# Patient Record
Sex: Female | Born: 1945 | Race: White | Hispanic: No | Marital: Single | Smoking: Former smoker
Health system: Southern US, Community
[De-identification: ages and names within clinical notes are randomized; demographics above are authoritative.]

## PROBLEM LIST (undated history)

## (undated) DIAGNOSIS — K219 Gastro-esophageal reflux disease without esophagitis: Secondary | ICD-10-CM

## (undated) DIAGNOSIS — Z86718 Personal history of other venous thrombosis and embolism: Secondary | ICD-10-CM

## (undated) DIAGNOSIS — E119 Type 2 diabetes mellitus without complications: Secondary | ICD-10-CM

## (undated) DIAGNOSIS — G4733 Obstructive sleep apnea (adult) (pediatric): Secondary | ICD-10-CM

## (undated) DIAGNOSIS — M353 Polymyalgia rheumatica: Secondary | ICD-10-CM

## (undated) HISTORY — PX: NECK SURGERY: SHX720

## (undated) HISTORY — DX: Obstructive sleep apnea (adult) (pediatric): G47.33

## (undated) HISTORY — DX: Morbid (severe) obesity due to excess calories: E66.01

## (undated) HISTORY — PX: CHOLECYSTECTOMY: SHX55

## (undated) HISTORY — DX: Personal history of other venous thrombosis and embolism: Z86.718

## (undated) HISTORY — DX: Type 2 diabetes mellitus without complications: E11.9

## (undated) HISTORY — DX: Polymyalgia rheumatica: M35.3

## (undated) HISTORY — PX: CYSTECTOMY: SUR359

## (undated) HISTORY — PX: OTHER SURGICAL HISTORY: SHX169

---

## 2006-10-12 ENCOUNTER — Telehealth: Payer: Self-pay | Admitting: Family Medicine

## 2006-10-12 ENCOUNTER — Ambulatory Visit: Payer: Self-pay | Admitting: Family Medicine

## 2006-10-12 DIAGNOSIS — G44209 Tension-type headache, unspecified, not intractable: Secondary | ICD-10-CM

## 2006-10-12 DIAGNOSIS — I2699 Other pulmonary embolism without acute cor pulmonale: Secondary | ICD-10-CM

## 2006-10-12 LAB — CONVERTED CEMR LAB
Albumin: 4.3 g/dL (ref 3.5–5.2)
Alkaline Phosphatase: 118 units/L — ABNORMAL HIGH (ref 39–117)
Amylase: 32 units/L (ref 0–105)
Basophils Relative: 0 % (ref 0–1)
CO2: 22 meq/L (ref 19–32)
Calcium: 8.7 mg/dL (ref 8.4–10.5)
Chloride: 101 meq/L (ref 96–112)
Glucose, Bld: 103 mg/dL — ABNORMAL HIGH (ref 70–99)
Hemoglobin: 15.4 g/dL — ABNORMAL HIGH (ref 12.0–15.0)
Hep B C IgM: NEGATIVE
Hepatitis B Surface Ag: NEGATIVE
INR: 1.6
Lymphocytes Relative: 27 % (ref 12–46)
Lymphs Abs: 1.5 10*3/uL (ref 0.7–3.3)
Monocytes Absolute: 0.5 10*3/uL (ref 0.2–0.7)
Monocytes Relative: 8 % (ref 3–11)
Neutro Abs: 3.7 10*3/uL (ref 1.7–7.7)
Neutrophils Relative %: 65 % (ref 43–77)
Potassium: 3.7 meq/L (ref 3.5–5.3)
Prothrombin Time: 15.7 s
RBC: 4.67 M/uL (ref 3.87–5.11)
Sodium: 135 meq/L (ref 135–145)
Specific Gravity, Urine: 1.021 (ref 1.005–1.03)
Total Protein: 7.1 g/dL (ref 6.0–8.3)
Urobilinogen, UA: 1 (ref 0.0–1.0)
WBC: 5.6 10*3/uL (ref 4.0–10.5)

## 2006-10-13 ENCOUNTER — Telehealth: Payer: Self-pay | Admitting: Family Medicine

## 2006-11-17 ENCOUNTER — Ambulatory Visit: Payer: Self-pay | Admitting: Family Medicine

## 2006-11-17 HISTORY — DX: Morbid (severe) obesity due to excess calories: E66.01

## 2006-11-17 LAB — CONVERTED CEMR LAB: Prothrombin Time: 18.9 s

## 2006-12-08 ENCOUNTER — Ambulatory Visit: Payer: Self-pay | Admitting: Family Medicine

## 2006-12-08 DIAGNOSIS — R5381 Other malaise: Secondary | ICD-10-CM

## 2006-12-08 DIAGNOSIS — R5383 Other fatigue: Secondary | ICD-10-CM

## 2007-01-05 ENCOUNTER — Ambulatory Visit: Payer: Self-pay | Admitting: Family Medicine

## 2007-01-05 ENCOUNTER — Encounter: Admission: RE | Admit: 2007-01-05 | Discharge: 2007-01-05 | Payer: Self-pay | Admitting: Family Medicine

## 2007-01-05 LAB — CONVERTED CEMR LAB
INR: 1.7
Prothrombin Time: 16.3 s

## 2007-02-15 ENCOUNTER — Ambulatory Visit: Payer: Self-pay | Admitting: Family Medicine

## 2007-02-15 ENCOUNTER — Telehealth: Payer: Self-pay | Admitting: Family Medicine

## 2007-02-15 DIAGNOSIS — H8110 Benign paroxysmal vertigo, unspecified ear: Secondary | ICD-10-CM | POA: Insufficient documentation

## 2007-05-18 ENCOUNTER — Encounter: Admission: RE | Admit: 2007-05-18 | Discharge: 2007-05-18 | Payer: Self-pay | Admitting: Family Medicine

## 2007-05-18 ENCOUNTER — Ambulatory Visit: Payer: Self-pay | Admitting: Family Medicine

## 2007-05-19 ENCOUNTER — Encounter: Payer: Self-pay | Admitting: Family Medicine

## 2007-06-01 ENCOUNTER — Ambulatory Visit: Payer: Self-pay | Admitting: Family Medicine

## 2007-06-01 DIAGNOSIS — M129 Arthropathy, unspecified: Secondary | ICD-10-CM

## 2007-06-30 ENCOUNTER — Ambulatory Visit: Payer: Self-pay | Admitting: Family Medicine

## 2007-06-30 DIAGNOSIS — R079 Chest pain, unspecified: Secondary | ICD-10-CM

## 2007-07-03 LAB — CONVERTED CEMR LAB
AST: 27 units/L (ref 0–37)
Albumin: 4.4 g/dL (ref 3.5–5.2)
BUN: 10 mg/dL (ref 6–23)
Calcium: 9.3 mg/dL (ref 8.4–10.5)
Chloride: 103 meq/L (ref 96–112)
Glucose, Bld: 92 mg/dL (ref 70–99)
Lymphocytes Relative: 33 % (ref 12–46)
Lymphs Abs: 2.9 10*3/uL (ref 0.7–4.0)
Monocytes Relative: 7 % (ref 3–12)
Neutro Abs: 5.1 10*3/uL (ref 1.7–7.7)
Neutrophils Relative %: 58 % (ref 43–77)
Potassium: 4.7 meq/L (ref 3.5–5.3)
RBC: 4.72 M/uL (ref 3.87–5.11)
WBC: 8.9 10*3/uL (ref 4.0–10.5)

## 2007-08-25 ENCOUNTER — Encounter: Payer: Self-pay | Admitting: Family Medicine

## 2007-08-25 ENCOUNTER — Ambulatory Visit: Payer: Self-pay | Admitting: Family Medicine

## 2007-08-25 ENCOUNTER — Encounter: Admission: RE | Admit: 2007-08-25 | Discharge: 2007-08-25 | Payer: Self-pay | Admitting: Family Medicine

## 2007-08-25 ENCOUNTER — Other Ambulatory Visit: Admission: RE | Admit: 2007-08-25 | Discharge: 2007-08-25 | Payer: Self-pay | Admitting: Family Medicine

## 2007-08-25 DIAGNOSIS — R609 Edema, unspecified: Secondary | ICD-10-CM | POA: Insufficient documentation

## 2007-08-25 LAB — CONVERTED CEMR LAB
Blood in Urine, dipstick: NEGATIVE
Glucose, Urine, Semiquant: NEGATIVE
Nitrite: NEGATIVE
Urobilinogen, UA: 0.2

## 2007-08-28 ENCOUNTER — Encounter: Payer: Self-pay | Admitting: Family Medicine

## 2007-08-28 DIAGNOSIS — E785 Hyperlipidemia, unspecified: Secondary | ICD-10-CM | POA: Insufficient documentation

## 2007-08-28 LAB — CONVERTED CEMR LAB
AST: 32 units/L (ref 0–37)
Albumin: 4.2 g/dL (ref 3.5–5.2)
Alkaline Phosphatase: 111 units/L (ref 39–117)
BUN: 10 mg/dL (ref 6–23)
Cholesterol, target level: 200 mg/dL
HDL: 41 mg/dL (ref >39)
LDL Cholesterol: 145 mg/dL — ABNORMAL HIGH (ref 0–99)
Potassium: 4.2 meq/L (ref 3.5–5.3)
TSH: 2.952 microintl units/mL (ref 0.350–5.50)
Total Bilirubin: 0.9 mg/dL (ref 0.3–1.2)
Total CHOL/HDL Ratio: 5.7
VLDL: 46 mg/dL — ABNORMAL HIGH (ref 0–40)

## 2007-09-20 ENCOUNTER — Encounter: Payer: Self-pay | Admitting: Family Medicine

## 2007-09-21 ENCOUNTER — Telehealth: Payer: Self-pay | Admitting: Family Medicine

## 2007-09-21 ENCOUNTER — Ambulatory Visit: Payer: Self-pay | Admitting: Family Medicine

## 2007-09-21 DIAGNOSIS — G4733 Obstructive sleep apnea (adult) (pediatric): Secondary | ICD-10-CM

## 2007-09-21 HISTORY — DX: Obstructive sleep apnea (adult) (pediatric): G47.33

## 2007-09-22 ENCOUNTER — Telehealth (INDEPENDENT_AMBULATORY_CARE_PROVIDER_SITE_OTHER): Payer: Self-pay | Admitting: *Deleted

## 2007-10-25 ENCOUNTER — Telehealth (INDEPENDENT_AMBULATORY_CARE_PROVIDER_SITE_OTHER): Payer: Self-pay | Admitting: *Deleted

## 2007-11-13 ENCOUNTER — Ambulatory Visit: Payer: Self-pay | Admitting: Family Medicine

## 2007-11-13 ENCOUNTER — Encounter: Admission: RE | Admit: 2007-11-13 | Discharge: 2007-11-13 | Payer: Self-pay | Admitting: Family Medicine

## 2007-11-13 DIAGNOSIS — M79609 Pain in unspecified limb: Secondary | ICD-10-CM | POA: Insufficient documentation

## 2007-11-14 ENCOUNTER — Telehealth: Payer: Self-pay | Admitting: Family Medicine

## 2007-11-14 LAB — CONVERTED CEMR LAB
Basophils Relative: 1 % (ref 0–1)
Eosinophils Absolute: 0.2 10*3/uL (ref 0.0–0.7)
HCT: 47.4 % — ABNORMAL HIGH (ref 36.0–46.0)
Hemoglobin: 15.7 g/dL — ABNORMAL HIGH (ref 12.0–15.0)
Lymphs Abs: 3.1 10*3/uL (ref 0.7–4.0)
MCHC: 33.1 g/dL (ref 30.0–36.0)
MCV: 99.8 fL (ref 78.0–100.0)
Monocytes Absolute: 0.5 10*3/uL (ref 0.1–1.0)
Monocytes Relative: 6 % (ref 3–12)
RBC: 4.75 M/uL (ref 3.87–5.11)

## 2008-03-20 ENCOUNTER — Ambulatory Visit: Payer: Self-pay | Admitting: Family Medicine

## 2008-03-20 DIAGNOSIS — J1089 Influenza due to other identified influenza virus with other manifestations: Secondary | ICD-10-CM

## 2008-03-25 ENCOUNTER — Telehealth (INDEPENDENT_AMBULATORY_CARE_PROVIDER_SITE_OTHER): Payer: Self-pay | Admitting: *Deleted

## 2008-03-25 ENCOUNTER — Ambulatory Visit: Payer: Self-pay | Admitting: Family Medicine

## 2008-03-25 ENCOUNTER — Telehealth: Payer: Self-pay | Admitting: Family Medicine

## 2008-06-13 ENCOUNTER — Ambulatory Visit: Payer: Self-pay | Admitting: Family Medicine

## 2008-06-13 DIAGNOSIS — M542 Cervicalgia: Secondary | ICD-10-CM

## 2008-06-19 ENCOUNTER — Encounter: Admission: RE | Admit: 2008-06-19 | Discharge: 2008-08-30 | Payer: Self-pay | Admitting: Family Medicine

## 2008-06-25 ENCOUNTER — Encounter: Payer: Self-pay | Admitting: Family Medicine

## 2008-07-25 ENCOUNTER — Encounter: Payer: Self-pay | Admitting: Family Medicine

## 2008-08-22 ENCOUNTER — Encounter: Admission: RE | Admit: 2008-08-22 | Discharge: 2008-08-22 | Payer: Self-pay | Admitting: Family Medicine

## 2008-08-22 ENCOUNTER — Telehealth (INDEPENDENT_AMBULATORY_CARE_PROVIDER_SITE_OTHER): Payer: Self-pay | Admitting: *Deleted

## 2008-08-22 ENCOUNTER — Ambulatory Visit: Payer: Self-pay | Admitting: Family Medicine

## 2008-08-22 DIAGNOSIS — R42 Dizziness and giddiness: Secondary | ICD-10-CM

## 2008-08-22 DIAGNOSIS — R0602 Shortness of breath: Secondary | ICD-10-CM | POA: Insufficient documentation

## 2008-08-22 DIAGNOSIS — R0600 Dyspnea, unspecified: Secondary | ICD-10-CM | POA: Insufficient documentation

## 2008-08-22 DIAGNOSIS — K219 Gastro-esophageal reflux disease without esophagitis: Secondary | ICD-10-CM | POA: Insufficient documentation

## 2008-08-23 ENCOUNTER — Encounter: Payer: Self-pay | Admitting: Family Medicine

## 2008-08-24 ENCOUNTER — Encounter: Payer: Self-pay | Admitting: Family Medicine

## 2008-08-26 ENCOUNTER — Encounter: Payer: Self-pay | Admitting: Family Medicine

## 2008-08-26 LAB — CONVERTED CEMR LAB
Albumin: 4.1 g/dL (ref 3.5–5.2)
Alkaline Phosphatase: 110 units/L (ref 39–117)
BUN: 13 mg/dL (ref 6–23)
Basophils Absolute: 0 10*3/uL (ref 0.0–0.1)
Basophils Relative: 1 % (ref 0–1)
CO2: 23 meq/L (ref 19–32)
Cholesterol: 225 mg/dL — ABNORMAL HIGH (ref 0–200)
Eosinophils Relative: 5 % (ref 0–5)
Glucose, Bld: 90 mg/dL (ref 70–99)
HCT: 45.2 % (ref 36.0–46.0)
HDL: 43 mg/dL (ref >39)
Hemoglobin: 15.3 g/dL — ABNORMAL HIGH (ref 12.0–15.0)
LDL Cholesterol: 138 mg/dL — ABNORMAL HIGH (ref 0–99)
Lymphocytes Relative: 42 % (ref 12–46)
MCHC: 33.8 g/dL (ref 30.0–36.0)
Monocytes Absolute: 0.5 10*3/uL (ref 0.1–1.0)
Monocytes Relative: 7 % (ref 3–12)
Potassium: 4.8 meq/L (ref 3.5–5.3)
RBC: 4.63 M/uL (ref 3.87–5.11)
RDW: 12.7 % (ref 11.5–15.5)
Sodium: 138 meq/L (ref 135–145)
Total Protein: 7 g/dL (ref 6.0–8.3)
Triglycerides: 221 mg/dL — ABNORMAL HIGH (ref <150)
Vit D, 25-Hydroxy: 26 ng/mL — ABNORMAL LOW (ref 30–89)

## 2008-08-27 DIAGNOSIS — R740 Nonspecific elevation of levels of transaminase and lactic acid dehydrogenase [LDH]: Secondary | ICD-10-CM

## 2008-08-27 LAB — CONVERTED CEMR LAB
HCV Ab: NEGATIVE
Hep A IgM: NEGATIVE
Hep B C IgM: NEGATIVE
Hepatitis B Surface Ag: NEGATIVE

## 2008-08-29 ENCOUNTER — Encounter: Payer: Self-pay | Admitting: Family Medicine

## 2008-08-30 ENCOUNTER — Telehealth (INDEPENDENT_AMBULATORY_CARE_PROVIDER_SITE_OTHER): Payer: Self-pay | Admitting: *Deleted

## 2008-09-02 ENCOUNTER — Encounter: Admission: RE | Admit: 2008-09-02 | Discharge: 2008-09-02 | Payer: Self-pay | Admitting: Family Medicine

## 2008-09-02 DIAGNOSIS — N289 Disorder of kidney and ureter, unspecified: Secondary | ICD-10-CM | POA: Insufficient documentation

## 2008-09-04 ENCOUNTER — Telehealth: Payer: Self-pay | Admitting: Family Medicine

## 2008-09-04 DIAGNOSIS — K7689 Other specified diseases of liver: Secondary | ICD-10-CM

## 2008-09-05 ENCOUNTER — Telehealth (INDEPENDENT_AMBULATORY_CARE_PROVIDER_SITE_OTHER): Payer: Self-pay | Admitting: *Deleted

## 2008-09-07 ENCOUNTER — Encounter: Admission: RE | Admit: 2008-09-07 | Discharge: 2008-09-07 | Payer: Self-pay | Admitting: Family Medicine

## 2008-09-09 ENCOUNTER — Ambulatory Visit: Payer: Self-pay | Admitting: Family Medicine

## 2008-09-09 DIAGNOSIS — R350 Frequency of micturition: Secondary | ICD-10-CM

## 2008-09-14 ENCOUNTER — Encounter: Admission: RE | Admit: 2008-09-14 | Discharge: 2008-09-14 | Payer: Self-pay | Admitting: Family Medicine

## 2008-09-16 ENCOUNTER — Telehealth (INDEPENDENT_AMBULATORY_CARE_PROVIDER_SITE_OTHER): Payer: Self-pay | Admitting: Radiology

## 2008-09-26 ENCOUNTER — Telehealth (INDEPENDENT_AMBULATORY_CARE_PROVIDER_SITE_OTHER): Payer: Self-pay | Admitting: *Deleted

## 2008-10-04 ENCOUNTER — Ambulatory Visit: Payer: Self-pay | Admitting: Family Medicine

## 2008-10-04 DIAGNOSIS — S6990XA Unspecified injury of unspecified wrist, hand and finger(s), initial encounter: Secondary | ICD-10-CM | POA: Insufficient documentation

## 2008-10-04 DIAGNOSIS — S6980XA Other specified injuries of unspecified wrist, hand and finger(s), initial encounter: Secondary | ICD-10-CM

## 2008-10-10 ENCOUNTER — Telehealth (INDEPENDENT_AMBULATORY_CARE_PROVIDER_SITE_OTHER): Payer: Self-pay

## 2008-10-14 ENCOUNTER — Ambulatory Visit: Payer: Self-pay

## 2008-10-14 ENCOUNTER — Encounter: Payer: Self-pay | Admitting: Family Medicine

## 2008-10-22 ENCOUNTER — Telehealth: Payer: Self-pay | Admitting: Family Medicine

## 2008-11-29 ENCOUNTER — Ambulatory Visit: Payer: Self-pay | Admitting: Family Medicine

## 2008-11-29 DIAGNOSIS — N390 Urinary tract infection, site not specified: Secondary | ICD-10-CM

## 2008-11-29 LAB — CONVERTED CEMR LAB
Bilirubin Urine: NEGATIVE
Nitrite: POSITIVE
Protein, U semiquant: NEGATIVE
Urobilinogen, UA: 0.2
pH: 5.5

## 2008-11-30 ENCOUNTER — Encounter: Payer: Self-pay | Admitting: Family Medicine

## 2008-12-02 LAB — CONVERTED CEMR LAB
AST: 28 units/L (ref 0–37)
Alkaline Phosphatase: 114 units/L (ref 39–117)
BUN: 12 mg/dL (ref 6–23)
Basophils Absolute: 0 10*3/uL (ref 0.0–0.1)
Basophils Relative: 1 % (ref 0–1)
Glucose, Bld: 92 mg/dL (ref 70–99)
Hemoglobin: 15 g/dL (ref 12.0–15.0)
Lymphocytes Relative: 46 % (ref 12–46)
MCHC: 33.8 g/dL (ref 30.0–36.0)
Monocytes Absolute: 0.7 10*3/uL (ref 0.1–1.0)
Neutro Abs: 3.4 10*3/uL (ref 1.7–7.7)
Neutrophils Relative %: 43 % (ref 43–77)
Platelets: 331 10*3/uL (ref 150–400)
Potassium: 4.5 meq/L (ref 3.5–5.3)
RDW: 13 % (ref 11.5–15.5)
Sodium: 140 meq/L (ref 135–145)
Total Bilirubin: 0.7 mg/dL (ref 0.3–1.2)
Total Protein: 6.7 g/dL (ref 6.0–8.3)

## 2010-05-31 IMAGING — CR DG CHEST 2V
2 series · 2 of 2 positions shown · non-contrast
Comparison: None

CLINICAL DATA: Short of breath, dizziness

CHEST - 2 VIEW

[view not recorded (1 of 2)]
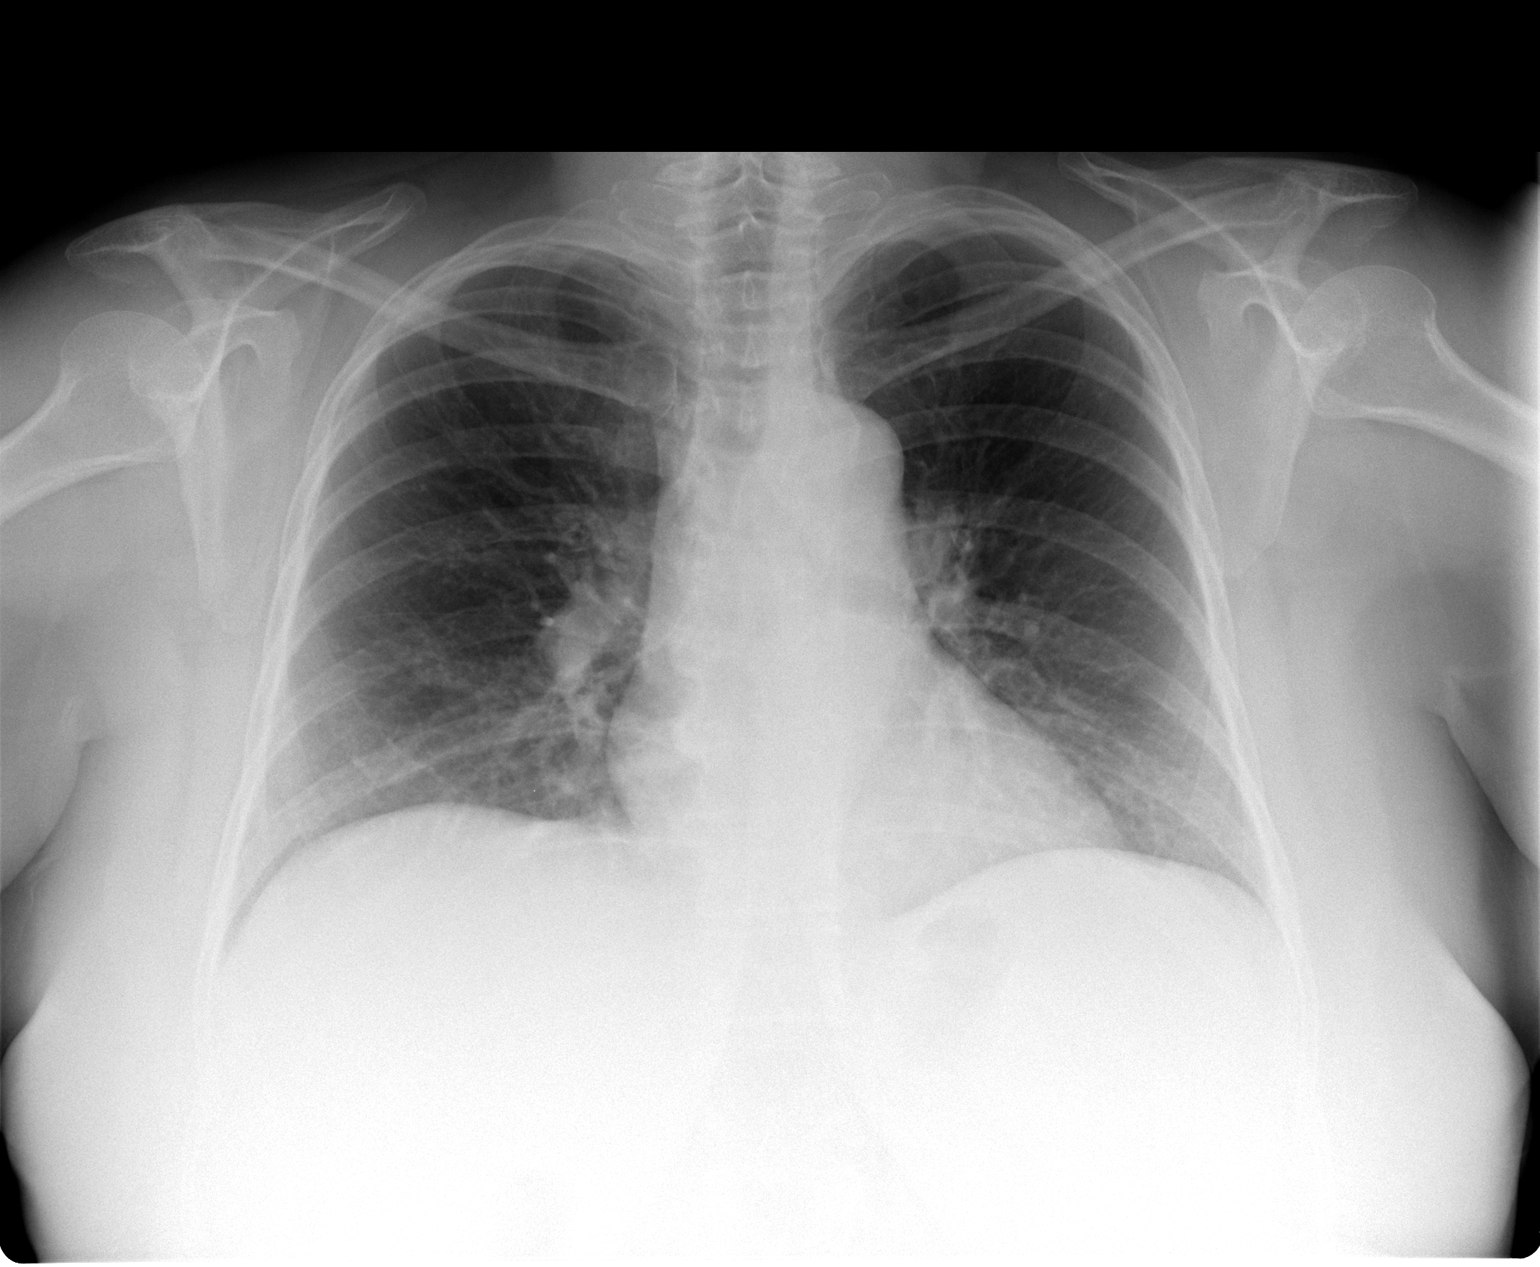

[view not recorded (2 of 2)]
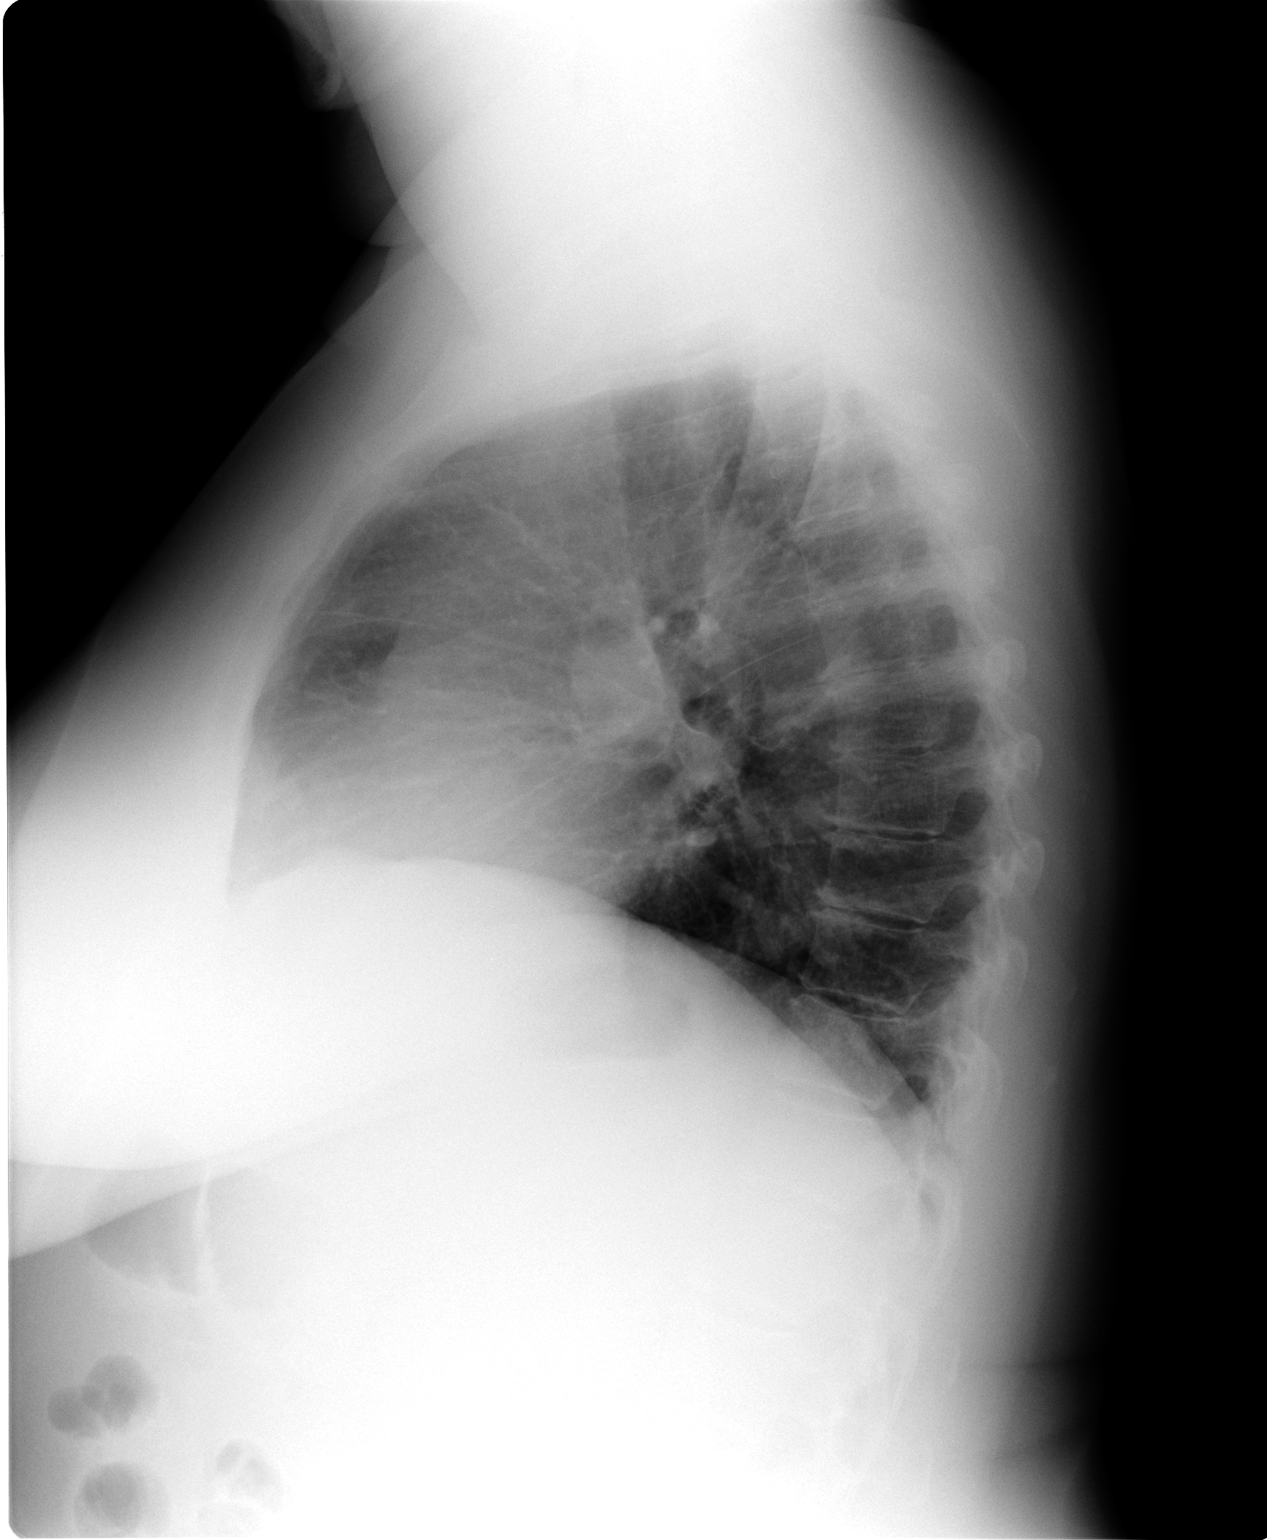

[2 of 2 positions shown; findings below may reference images not displayed]

FINDINGS: The lungs are clear. There is mild peribronchial
thickening which could indicate bronchitis. The heart is within
normal limits in size. Mild degenerative change is noted in the
thoracic spine.
IMPRESSION: No active lung disease. Mild peribronchial thickening may indicate
bronchitis.

## 2012-01-13 ENCOUNTER — Encounter: Payer: Self-pay | Admitting: Cardiovascular Disease

## 2013-09-03 DIAGNOSIS — H16009 Unspecified corneal ulcer, unspecified eye: Secondary | ICD-10-CM | POA: Diagnosis not present

## 2013-09-03 DIAGNOSIS — H16149 Punctate keratitis, unspecified eye: Secondary | ICD-10-CM | POA: Diagnosis not present

## 2013-09-03 DIAGNOSIS — H1820 Unspecified corneal edema: Secondary | ICD-10-CM | POA: Diagnosis not present

## 2013-09-13 DIAGNOSIS — H16149 Punctate keratitis, unspecified eye: Secondary | ICD-10-CM | POA: Diagnosis not present

## 2013-12-14 ENCOUNTER — Ambulatory Visit (INDEPENDENT_AMBULATORY_CARE_PROVIDER_SITE_OTHER): Payer: Medicare Other | Admitting: Physician Assistant

## 2013-12-14 ENCOUNTER — Encounter: Payer: Self-pay | Admitting: Physician Assistant

## 2013-12-14 VITALS — BP 143/69 | HR 75 | Ht 60.5 in | Wt 222.0 lb

## 2013-12-14 DIAGNOSIS — M2550 Pain in unspecified joint: Secondary | ICD-10-CM | POA: Diagnosis not present

## 2013-12-14 DIAGNOSIS — R5383 Other fatigue: Principal | ICD-10-CM

## 2013-12-14 DIAGNOSIS — R5381 Other malaise: Secondary | ICD-10-CM

## 2013-12-14 DIAGNOSIS — R0602 Shortness of breath: Secondary | ICD-10-CM

## 2013-12-14 DIAGNOSIS — Z1239 Encounter for other screening for malignant neoplasm of breast: Secondary | ICD-10-CM

## 2013-12-14 DIAGNOSIS — R52 Pain, unspecified: Secondary | ICD-10-CM | POA: Diagnosis not present

## 2013-12-14 DIAGNOSIS — E785 Hyperlipidemia, unspecified: Secondary | ICD-10-CM

## 2013-12-14 DIAGNOSIS — Z13220 Encounter for screening for lipoid disorders: Secondary | ICD-10-CM | POA: Diagnosis not present

## 2013-12-14 NOTE — Progress Notes (Signed)
Subjective:    Patient ID: Nicole Giles, female    DOB: 07/27/1945, 68 y.o.   MRN: [id number]  HPI Pt is a 68 yo new patient who presents to the clinic to establish care.   She has not been seen in a few years but would like to get to where she is feeling better.   .. Active Ambulatory Problems    Diagnosis Date Noted  . HYPERLIPIDEMIA 08/28/2007  . OBESITY NOS 11/17/2006  . HEADACHE, TENSION 10/12/2006  . OBSTRUCTIVE SLEEP APNEA 09/21/2007  . VERTIGO, BENIGN PAROXYSMAL POSITION 02/15/2007  . 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 INFLUENZA DUE TO ID NOVEL H1N1 INFLUENZA VIRUS 03/20/2008  . GERD 08/22/2008  . FATTY LIVER DISEASE 09/04/2008  . UNSPECIFIED DISORDER OF KIDNEY AND URETER 09/02/2008  . UTI 11/29/2008  . ARTHRITIS, KNEES, BILATERAL 06/01/2007  . CERVICALGIA 06/13/2008  . Pain in Soft Tissues of Limb 11/13/2007  . DIZZINESS 08/22/2008  . FATIGUE 12/08/2006  . DEPENDENT EDEMA, LEGS 08/25/2007  . DYSPNEA 08/22/2008  . CHEST PAIN 06/30/2007  . FREQUENCY, URINARY 09/09/2008  . TRANSAMINASES, SERUM, ELEVATED 08/27/2008  . INJURY OTHER AND UNSPECIFIED FINGER 10/04/2008  . Hyperlipemia 12/17/2013   Resolved Ambulatory Problems    Diagnosis Date Noted  . No Resolved Ambulatory Problems   Past Medical History  Diagnosis Date  . H/O blood clots    .[name] Family History  Problem Relation Age of Onset  . Prostate cancer    . Diabetes    . Diabetes Brother    . History   Social History  . Marital Status: Single    Spouse Name: N/A    Number of Children: N/A  . Years of Education: N/A   Occupational History  . Not on file.   Social History Main Topics  . Smoking status: Former Research scientist (life sciences)  . Smokeless tobacco: Not on file  . Alcohol Use: No  . Drug Use: No  . Sexual Activity: No   Other Topics Concern  . Not on file   Social History Narrative  . No narrative on file   She comes in complaining of months of just not feeling well. She has no desire to get up  in the morning. She is out of breath with little to no exertion. No wheezing.  She has to make herself be productive. She has joint pain all over, she has body aches. She denies suicidal or homicidal thoughts. She is getting more frequent headaches. She carries dx of sleep apnea but cannot stand to use CPAP machine. Pt admits to a lot of stress. She has to work full time and care for her son who is permanently disabled from head trauma many years ago.      Review of Systems  All other systems reviewed and are negative.      Objective:   Physical Exam  Constitutional: She is oriented to person, place, and time. She appears well-developed and well-nourished.  HENT:  Head: Normocephalic and atraumatic.  Cardiovascular: Normal rate, regular rhythm and normal heart sounds.   Pulmonary/Chest: Effort normal and breath sounds normal.  Musculoskeletal:  Strength 5/5 upper and lower extremities.   Neurological: She is alert and oriented to person, place, and time.  Skin: Skin is dry.  Psychiatric: She has a normal mood and affect. Her behavior is normal.          Assessment & Plan:  Fatigue/arthraglia/SOB/body aches- symptoms sound very suscpious for depression. Pt is not in acceptance of that  diagnosis today. Will check TSH, CMP, CBC. Tried to order b12 and vitamin D but insurance would not pay. Suggested to pt to start vitamin D 1000units daily and b12 109mcg daily. Follow up as needed or if symptoms change. Peak flows were all in green zone. Follow up in 4 weeks.   Hyperlipidemia- labs ordered for recheck.   Declined tetanus today. Will check insurance.  Needs mammogram will refer.  Pt also need colonoscopy but will check with insurance first and then call for referral.

## 2013-12-14 NOTE — Patient Instructions (Addendum)
Mammogram/Colonoscopy.   b12 1071mcg a day.  Vitamin 800 units a day.

## 2013-12-17 DIAGNOSIS — M2550 Pain in unspecified joint: Secondary | ICD-10-CM | POA: Insufficient documentation

## 2013-12-17 DIAGNOSIS — E785 Hyperlipidemia, unspecified: Secondary | ICD-10-CM | POA: Insufficient documentation

## 2013-12-17 DIAGNOSIS — R52 Pain, unspecified: Secondary | ICD-10-CM | POA: Insufficient documentation

## 2014-01-15 DIAGNOSIS — Z13220 Encounter for screening for lipoid disorders: Secondary | ICD-10-CM | POA: Diagnosis not present

## 2014-01-15 DIAGNOSIS — R5381 Other malaise: Secondary | ICD-10-CM | POA: Diagnosis not present

## 2014-01-15 DIAGNOSIS — E785 Hyperlipidemia, unspecified: Secondary | ICD-10-CM | POA: Diagnosis not present

## 2014-01-15 DIAGNOSIS — M2550 Pain in unspecified joint: Secondary | ICD-10-CM | POA: Diagnosis not present

## 2014-01-15 DIAGNOSIS — R0602 Shortness of breath: Secondary | ICD-10-CM | POA: Diagnosis not present

## 2014-01-15 DIAGNOSIS — R52 Pain, unspecified: Secondary | ICD-10-CM | POA: Diagnosis not present

## 2014-01-16 ENCOUNTER — Encounter: Payer: Self-pay | Admitting: Physician Assistant

## 2014-01-16 DIAGNOSIS — R7301 Impaired fasting glucose: Secondary | ICD-10-CM | POA: Insufficient documentation

## 2014-01-16 LAB — LIPID PANEL
CHOL/HDL RATIO: 5.3 ratio
Cholesterol: 201 mg/dL — ABNORMAL HIGH (ref 0–200)
HDL: 38 mg/dL — AB (ref >39)
LDL CALC: 121 mg/dL — AB (ref 0–99)
TRIGLYCERIDES: 209 mg/dL — AB (ref <150)
VLDL: 42 mg/dL — ABNORMAL HIGH (ref 0–40)

## 2014-01-16 LAB — COMPLETE METABOLIC PANEL WITH GFR
ALBUMIN: 3.9 g/dL (ref 3.5–5.2)
ALK PHOS: 119 U/L — AB (ref 39–117)
ALT: 75 U/L — AB (ref 0–35)
AST: 85 U/L — AB (ref 0–37)
BILIRUBIN TOTAL: 0.9 mg/dL (ref 0.2–1.2)
BUN: 11 mg/dL (ref 6–23)
CO2: 24 mEq/L (ref 19–32)
Calcium: 9.2 mg/dL (ref 8.4–10.5)
Chloride: 104 mEq/L (ref 96–112)
Creat: 0.72 mg/dL (ref 0.50–1.10)
GFR, Est African American: >89 mL/min
GFR, Est Non African American: 88 mL/min
Glucose, Bld: 172 mg/dL — ABNORMAL HIGH (ref 70–99)
POTASSIUM: 4.4 meq/L (ref 3.5–5.3)
SODIUM: 140 meq/L (ref 135–145)
TOTAL PROTEIN: 6.6 g/dL (ref 6.0–8.3)

## 2014-01-16 LAB — CBC WITH DIFFERENTIAL/PLATELET
BASOS ABS: 0.1 10*3/uL (ref 0.0–0.1)
BASOS PCT: 1 % (ref 0–1)
Eosinophils Absolute: 0.3 10*3/uL (ref 0.0–0.7)
Eosinophils Relative: 4 % (ref 0–5)
HCT: 43.8 % (ref 36.0–46.0)
HEMOGLOBIN: 14.9 g/dL (ref 12.0–15.0)
Lymphocytes Relative: 41 % (ref 12–46)
Lymphs Abs: 2.7 10*3/uL (ref 0.7–4.0)
MCH: 33.9 pg (ref 26.0–34.0)
MCHC: 34 g/dL (ref 30.0–36.0)
MCV: 99.8 fL (ref 78.0–100.0)
MONOS PCT: 6 % (ref 3–12)
Monocytes Absolute: 0.4 10*3/uL (ref 0.1–1.0)
NEUTROS ABS: 3.2 10*3/uL (ref 1.7–7.7)
NEUTROS PCT: 48 % (ref 43–77)
PLATELETS: 294 10*3/uL (ref 150–400)
RBC: 4.39 MIL/uL (ref 3.87–5.11)
RDW: 12.9 % (ref 11.5–15.5)
WBC: 6.6 10*3/uL (ref 4.0–10.5)

## 2014-01-16 LAB — TSH: TSH: 3.639 u[IU]/mL (ref 0.350–4.500)

## 2014-01-21 ENCOUNTER — Ambulatory Visit (INDEPENDENT_AMBULATORY_CARE_PROVIDER_SITE_OTHER): Payer: Medicare Other | Admitting: Physician Assistant

## 2014-01-21 ENCOUNTER — Encounter: Payer: Self-pay | Admitting: Physician Assistant

## 2014-01-21 VITALS — BP 130/60 | HR 77 | Ht 60.0 in | Wt 220.0 lb

## 2014-01-21 DIAGNOSIS — R7303 Prediabetes: Secondary | ICD-10-CM

## 2014-01-21 DIAGNOSIS — E7800 Pure hypercholesterolemia, unspecified: Secondary | ICD-10-CM

## 2014-01-21 DIAGNOSIS — E669 Obesity, unspecified: Secondary | ICD-10-CM | POA: Diagnosis not present

## 2014-01-21 DIAGNOSIS — G4733 Obstructive sleep apnea (adult) (pediatric): Secondary | ICD-10-CM

## 2014-01-21 DIAGNOSIS — R7309 Other abnormal glucose: Secondary | ICD-10-CM

## 2014-01-21 DIAGNOSIS — R7301 Impaired fasting glucose: Secondary | ICD-10-CM

## 2014-01-21 LAB — POCT GLYCOSYLATED HEMOGLOBIN (HGB A1C): HEMOGLOBIN A1C: 6.3

## 2014-01-21 MED ORDER — PHENTERMINE HCL 37.5 MG PO TABS: 37.5000 mg | ORAL_TABLET | Freq: Every day | ORAL | Status: DC

## 2014-01-21 NOTE — Patient Instructions (Signed)
3-6 months of diet and exercise and then recheck cholesterol and sugars.

## 2014-01-23 DIAGNOSIS — R7303 Prediabetes: Secondary | ICD-10-CM | POA: Insufficient documentation

## 2014-01-23 NOTE — Progress Notes (Signed)
   Subjective:    Patient ID: Nicole Giles, female    DOB: 1945/11/07, 68 y.o.   MRN: [id number]  HPI Pt presents to the clinic to follow up on imparled fasting glucose and to discuss other lab work.    Review of Systems  All other systems reviewed and are negative.      Objective:   Physical Exam  Constitutional: She is oriented to person, place, and time. She appears well-developed and well-nourished.  Obese.   HENT:  Head: Normocephalic and atraumatic.  Cardiovascular: Normal rate, regular rhythm and normal heart sounds.   Pulmonary/Chest: Effort normal and breath sounds normal. She has no wheezes.  Neurological: She is alert and oriented to person, place, and time.  Skin: Skin is dry.  Psychiatric: She has a normal mood and affect. Her behavior is normal.          Assessment & Plan:  Pre-diabetes- .[name] Lab Results  Component Value Date   HGBA1C 6.3 01/21/2014   Discussed with pt not yet consider DM. Discussed starting metformin but pt declined starting a medication until she had too. Had a very detailed conversation about what causes diabetes and her diet and weight. Will follow up in 6 months with another A1C. We are going to target weight loss and diet. Offered nutritionist and sent referral.    Obesity- will start phentermine. Discussed Side effects. Pt aware to combine with diet and weight loss. Follow up in one month.   Elevated LDL- overall cardiac 10 yr  risk without diabetes is 4 percent. Discussed with DM is on dx pushes our LDL goal down and overall risk up. Discussed medication but will hold off for know with a trial of lifestyle modifcations.   OSA- not wearing CPAP. Discussed could be causing lack of energy.

## 2014-02-25 ENCOUNTER — Ambulatory Visit: Payer: Medicare Other | Admitting: Physician Assistant

## 2014-03-01 ENCOUNTER — Encounter: Payer: Self-pay | Admitting: Physician Assistant

## 2014-03-01 ENCOUNTER — Ambulatory Visit (INDEPENDENT_AMBULATORY_CARE_PROVIDER_SITE_OTHER): Payer: Medicare Other | Admitting: Physician Assistant

## 2014-03-01 VITALS — BP 116/59 | HR 67 | Ht 60.0 in | Wt 219.0 lb

## 2014-03-01 DIAGNOSIS — R635 Abnormal weight gain: Secondary | ICD-10-CM | POA: Diagnosis not present

## 2014-03-01 DIAGNOSIS — E669 Obesity, unspecified: Secondary | ICD-10-CM | POA: Diagnosis not present

## 2014-03-01 NOTE — Patient Instructions (Addendum)
Diabetes Mellitus and Food It is important for you to manage your blood sugar (glucose) level. Your blood glucose level can be greatly affected by what you eat. Eating healthier foods in the appropriate amounts throughout the day at about the same time each day will help you control your blood glucose level. It can also help slow or prevent worsening of your diabetes mellitus. Healthy eating may even help you improve the level of your blood pressure and reach or maintain a healthy weight.  HOW CAN FOOD AFFECT ME? Carbohydrates Carbohydrates affect your blood glucose level more than any other type of food. Your dietitian will help you determine how many carbohydrates to eat at each meal and teach you how to count carbohydrates. Counting carbohydrates is important to keep your blood glucose at a healthy level, especially if you are using insulin or taking certain medicines for diabetes mellitus. Alcohol Alcohol can cause sudden decreases in blood glucose (hypoglycemia), especially if you use insulin or take certain medicines for diabetes mellitus. Hypoglycemia can be a life-threatening condition. Symptoms of hypoglycemia (sleepiness, dizziness, and disorientation) are similar to symptoms of having too much alcohol.  If your health care provider has given you approval to drink alcohol, do so in moderation and use the following guidelines:  Women should not have more than one drink per day, and men should not have more than two drinks per day. One drink is equal to:  12 oz of beer.  5 oz of wine.  1 oz of hard liquor.  Do not drink on an empty stomach.  Keep yourself hydrated. Have water, diet soda, or unsweetened iced tea.  Regular soda, juice, and other mixers might contain a lot of carbohydrates and should be counted. WHAT FOODS ARE NOT RECOMMENDED? As you make food choices, it is important to remember that all foods are not the same. Some foods have fewer nutrients per serving than other  foods, even though they might have the same number of calories or carbohydrates. It is difficult to get your body what it needs when you eat foods with fewer nutrients. Examples of foods that you should avoid that are high in calories and carbohydrates but low in nutrients include:  Trans fats (most processed foods list trans fats on the Nutrition Facts label).  Regular soda.  Juice.  Candy.  Sweets, such as cake, pie, doughnuts, and cookies.  Fried foods. WHAT FOODS CAN I EAT? Have nutrient-rich foods, which will nourish your body and keep you healthy. The food you should eat also will depend on several factors, including:  The calories you need.  The medicines you take.  Your weight.  Your blood glucose level.  Your blood pressure level.  Your cholesterol level. You also should eat a variety of foods, including:  Protein, such as meat, poultry, fish, tofu, nuts, and seeds (lean animal proteins are best).  Fruits.  Vegetables.  Dairy products, such as milk, cheese, and yogurt (low fat is best).  Breads, grains, pasta, cereal, rice, and beans.  Fats such as olive oil, trans fat-free margarine, canola oil, avocado, and olives. DOES EVERYONE WITH DIABETES MELLITUS HAVE THE SAME MEAL PLAN? Because every person with diabetes mellitus is different, there is not one meal plan that works for everyone. It is very important that you meet with a dietitian who will help you create a meal plan that is just right for you. Document Released: 01/21/2005 Document Revised: 05/01/2013 Document Reviewed: 03/23/2013 ExitCare Patient Information 2015 [employer]. This   information is not intended to replace advice given to you by your health care provider. Make sure you discuss any questions you have with your health care provider.   Pneumococcal Vaccine, Polyvalent suspension for injection What is this medicine? PNEUMOCOCCAL VACCINE, POLYVALENT (NEU mo KOK al vak SEEN, pol ee VEY  luhnt) is a vaccine to prevent pneumococcus bacteria infection. These bacteria are a major cause of ear infections, 'Strep throat' infections, and serious pneumonia, meningitis, or blood infections worldwide. These vaccines help the body to produce antibodies (protective substances) that help your body defend against these bacteria. This vaccine is recommended for infants and young children. This vaccine will not treat an infection. This medicine may be used for other purposes; ask your health care provider or pharmacist if you have questions. COMMON BRAND NAME(S): Prevnar 13 What should I tell my health care provider before I take this medicine? They need to know if you have any of these conditions: -bleeding problems -fever -immune system problems -low platelet count in the blood -seizures -an unusual or allergic reaction to pneumococcal vaccine, diphtheria toxoid, other vaccines, latex, other medicines, foods, dyes, or preservatives -pregnant or trying to get pregnant -breast-feeding How should I use this medicine? This vaccine is for injection into a muscle. It is given by a health care professional. A copy of Vaccine Information Statements will be given before each vaccination. Read this sheet carefully each time. The sheet may change frequently. Talk to your pediatrician regarding the use of this medicine in children. While this drug may be prescribed for children as young as 74 weeks old for selected conditions, precautions do apply. Overdosage: If you think you have taken too much of this medicine contact a poison control center or emergency room at once. NOTE: This medicine is only for you. Do not share this medicine with others. What if I miss a dose? It is important not to miss your dose. Call your doctor or health care professional if you are unable to keep an appointment. What may interact with this medicine? -medicines for cancer chemotherapy -medicines that suppress your immune  function -medicines that treat or prevent blood clots like warfarin, enoxaparin, and dalteparin -steroid medicines like prednisone or cortisone This list may not describe all possible interactions. Give your health care provider a list of all the medicines, herbs, non-prescription drugs, or dietary supplements you use. Also tell them if you smoke, drink alcohol, or use illegal drugs. Some items may interact with your medicine. What should I watch for while using this medicine? Mild fever and pain should go away in 3 days or less. Report any unusual symptoms to your doctor or health care professional. What side effects may I notice from receiving this medicine? Side effects that you should report to your doctor or health care professional as soon as possible: -allergic reactions like skin rash, itching or hives, swelling of the face, lips, or tongue -breathing problems -confused -fever over 102 degrees F -pain, tingling, numbness in the hands or feet -seizures -unusual bleeding or bruising -unusual muscle weakness Side effects that usually do not require medical attention (report to your doctor or health care professional if they continue or are bothersome): -aches and pains -diarrhea -fever of 102 degrees F or less -headache -irritable -loss of appetite -pain, tender at site where injected -trouble sleeping This list may not describe all possible side effects. Call your doctor for medical advice about side effects. You may report side effects to FDA at 1-800-FDA-1088. Where should  I keep my medicine? This does not apply. This vaccine is given in a clinic, pharmacy, doctor's office, or other health care setting and will not be stored at home. NOTE: This sheet is a summary. It may not cover all possible information. If you have questions about this medicine, talk to your doctor, pharmacist, or health care provider.  2015, Elsevier/Gold Standard. (2008-07-09 10:17:22)

## 2014-03-04 NOTE — Progress Notes (Signed)
   Subjective:    Patient ID: Nicole Giles, female    DOB: May 07, 1946, 68 y.o.   MRN: [id number]  HPI Pt presents to the clinic to follow up on obesity and weight gain. She did not start phentermine. She wanted to try one more month on her own. She has cut back on carbs and started to walking at least 3 times a week. She was not able to afford nutritionist appt. Lost one pound. She does admit that she does not like breakfast. She just does not eat.    Review of Systems  All other systems reviewed and are negative.      Objective:   Physical Exam  Constitutional: She is oriented to person, place, and time. She appears well-developed and well-nourished.  Obesity.   HENT:  Head: Normocephalic and atraumatic.  Cardiovascular: Normal rate, regular rhythm and normal heart sounds.   Pulmonary/Chest: Effort normal and breath sounds normal. She has no wheezes.  Neurological: She is alert and oriented to person, place, and time.  Skin: Skin is dry.  Psychiatric: She has a normal mood and affect. Her behavior is normal.          Assessment & Plan:  Obesity/abnormal weight gain- due to 1lb weight loss will try phentermine. Discussed to start with 1/2 tablet. Went over side effects again. Follow up in one month. Keep making diet changes and increase walking to at least 124minutes a week. Handouts were give with how to keep diabetic diet and weight loss. Encouraged to count calories with no more than 1500. Encouraged to eat breakfast.   Spent 30 minutes with patient and greater than 50 percent of visit spent counseling pt reguarding nutrition and weight loss.

## 2014-04-03 ENCOUNTER — Ambulatory Visit (INDEPENDENT_AMBULATORY_CARE_PROVIDER_SITE_OTHER): Payer: Medicare Other | Admitting: Physician Assistant

## 2014-04-03 ENCOUNTER — Encounter: Payer: Self-pay | Admitting: Physician Assistant

## 2014-04-03 VITALS — BP 116/71 | HR 80 | Temp 97.7°F | Ht 60.0 in | Wt 207.0 lb

## 2014-04-03 DIAGNOSIS — J0100 Acute maxillary sinusitis, unspecified: Secondary | ICD-10-CM | POA: Diagnosis not present

## 2014-04-03 DIAGNOSIS — R635 Abnormal weight gain: Secondary | ICD-10-CM

## 2014-04-03 MED ORDER — AMOXICILLIN-POT CLAVULANATE 875-125 MG PO TABS: 1.0000 | ORAL_TABLET | Freq: Two times a day (BID) | ORAL | Status: DC

## 2014-04-03 MED ORDER — PHENTERMINE HCL 37.5 MG PO TABS: 37.5000 mg | ORAL_TABLET | Freq: Every day | ORAL | Status: DC

## 2014-04-03 NOTE — Progress Notes (Signed)
   Subjective:    Patient ID: Nicole Giles, female    DOB: 1946/01/11, 68 y.o.   MRN: [id number]  HPI  Patient presents to the clinic to follow-up on phentermine for weight loss. She has done well as far as diet changes and walking more. She has lost 12 pounds in the first month. She denies any insomnia, palpitations or dry mouth. She has had some constipation over the last 3 days. She's not tried anything yet to help with this.   Patient also complains of sinus pressure, ear pain and cough for the last couple weeks. She has had a cold that seems to be lingering. She has tried over-the-counter decongestants and Mucinex. Does not seem to be helping. She denies any fever, chills, nausea or vomiting. She denies any shortness of breath or wheezing. Cough is mainly dry.    Review of Systems  All other systems reviewed and are negative.      Objective:   Physical Exam  Constitutional: She is oriented to person, place, and time. She appears well-developed and well-nourished.  Obesity.  HENT:  Head: Normocephalic and atraumatic.  Right Ear: External ear normal.  Left Ear: External ear normal.  Mouth/Throat: Oropharynx is clear and moist. No oropharyngeal exudate.  TM slightly bulging with some air-fluid levels behind them. There was maxillary sinus tenderness to palpation bilaterally. Nasal turbinates are red and swollen bilaterally.  Eyes: Conjunctivae are normal.  Neck: Normal range of motion. Neck supple.  Cardiovascular: Normal rate, regular rhythm and normal heart sounds.   Pulmonary/Chest: Effort normal and breath sounds normal. She has no wheezes.  Lymphadenopathy:    She has no cervical adenopathy.  Neurological: She is alert and oriented to person, place, and time.  Skin: Skin is dry.  Psychiatric: She has a normal mood and affect. Her behavior is normal.          Assessment & Plan:  Obesity/abnormal weight gain-we'll continue with phentermine. Suggest the patient cut to  half a tab since she's having problems with constipation. Continue to work on weight loss with diet and exercise changes. Follow-up in one month with nurse visit.  Constipation-likely due to side effect of stimulant. Suggested MiraLAX 1 cupful at night as needed and or a stool softener. Increase water and fiber in diet. If not improving please let us know.  Acute maxillary sinusitis-treated with Augmentin for 10 days. Gave handout on symptomatic care. Consider Flonase for nasal congestion and ear pain. Follow-up as needed.

## 2014-05-01 ENCOUNTER — Ambulatory Visit (INDEPENDENT_AMBULATORY_CARE_PROVIDER_SITE_OTHER): Payer: Medicare Other | Admitting: Physician Assistant

## 2014-05-01 ENCOUNTER — Encounter: Payer: Self-pay | Admitting: Physician Assistant

## 2014-05-01 VITALS — BP 123/73 | HR 81 | Ht 60.0 in | Wt 201.0 lb

## 2014-05-01 DIAGNOSIS — R05 Cough: Secondary | ICD-10-CM

## 2014-05-01 DIAGNOSIS — E669 Obesity, unspecified: Secondary | ICD-10-CM | POA: Diagnosis not present

## 2014-05-01 DIAGNOSIS — R635 Abnormal weight gain: Secondary | ICD-10-CM | POA: Diagnosis not present

## 2014-05-01 DIAGNOSIS — R059 Cough, unspecified: Secondary | ICD-10-CM

## 2014-05-01 MED ORDER — PHENTERMINE HCL 37.5 MG PO TABS: 37.5000 mg | ORAL_TABLET | Freq: Every day | ORAL | Status: DC

## 2014-05-01 MED ORDER — BENZONATATE 200 MG PO CAPS: 200.0000 mg | ORAL_CAPSULE | Freq: Two times a day (BID) | ORAL | Status: DC | PRN

## 2014-05-01 NOTE — Patient Instructions (Signed)
Cough, Adult  A cough is a reflex that helps clear your throat and airways. It can help heal the body or may be a reaction to an irritated airway. A cough may only last 2 or 3 weeks (acute) or may last more than 8 weeks (chronic).  CAUSES Acute cough:  Viral or bacterial infections. Chronic cough:  Infections.  Allergies.  Asthma.  Post-nasal drip.  Smoking.  Heartburn or acid reflux.  Some medicines.  Chronic lung problems (COPD).  Cancer. SYMPTOMS   Cough.  Fever.  Chest pain.  Increased breathing rate.  High-pitched whistling sound when breathing (wheezing).  Colored mucus that you cough up (sputum). TREATMENT   A bacterial cough may be treated with antibiotic medicine.  A viral cough must run its course and will not respond to antibiotics.  Your caregiver may recommend other treatments if you have a chronic cough. HOME CARE INSTRUCTIONS   Only take over-the-counter or prescription medicines for pain, discomfort, or fever as directed by your caregiver. Use cough suppressants only as directed by your caregiver.  Use a cold steam vaporizer or humidifier in your bedroom or home to help loosen secretions.  Sleep in a semi-upright position if your cough is worse at night.  Rest as needed.  Stop smoking if you smoke. SEEK IMMEDIATE MEDICAL CARE IF:   You have pus in your sputum.  Your cough starts to worsen.  You cannot control your cough with suppressants and are losing sleep.  You begin coughing up blood.  You have difficulty breathing.  You develop pain which is getting worse or is uncontrolled with medicine.  You have a fever. MAKE SURE YOU:   Understand these instructions.  Will watch your condition.  Will get help right away if you are not doing well or get worse. Document Released: 10/23/2010 Document Revised: 07/19/2011 Document Reviewed: 10/23/2010 ExitCare Patient Information 2015 [employer]. This information is not intended  to replace advice given to you by your health care provider. Make sure you discuss any questions you have with your health care provider.  

## 2014-05-01 NOTE — Addendum Note (Signed)
Addended by: Narda Rutherford on: 05/01/2014 12:22 PM   Modules accepted: Orders

## 2014-05-01 NOTE — Progress Notes (Addendum)
   Subjective:    Patient ID: Nicole Giles, female    DOB: Jan 08, 1946, 68 y.o.   MRN: [id number]  HPI Pt presents to the clinic to follow up on weight loss with phentermine. Doing well. Lost another 6lbs. Only side effect is constipation but controlled with activa. Trying to walk more but right knee still hurts with overuse from arthritis. No palpitations or CP. Eating small freqentmeals. Feeling better daily.   Still has lingering dry cough. Treated with augmentin 1 month ago and all other symptoms resolved.  Got a new cat. Taking claritin. No wheezing, SOB, fever, chills,HA, sinus pressure. No heartburn symptoms.    Review of Systems  All other systems reviewed and are negative.      Objective:   Physical Exam  Constitutional: She is oriented to person, place, and time. She appears well-developed and well-nourished.  HENT:  Head: Normocephalic and atraumatic.  Right Ear: External ear normal.  Left Ear: External ear normal.  Nose: Nose normal.  Mouth/Throat: Oropharynx is clear and moist.  Eyes: Conjunctivae are normal. Right eye exhibits no discharge. Left eye exhibits no discharge.  Neck: Normal range of motion. Neck supple.  Cardiovascular: Normal rate, regular rhythm and normal heart sounds.   Pulmonary/Chest: Effort normal and breath sounds normal. She has no wheezes.  Lymphadenopathy:    She has no cervical adenopathy.  Neurological: She is alert and oriented to person, place, and time.  Skin: Skin is dry.  Psychiatric: She has a normal mood and affect. Her behavior is normal.          Assessment & Plan:   Obesity/weight gain- refilled phentermine for 2 months. Follow up after. Encouraged pt to continue good diet and exercise when as pain tolerates.   Cough- seems like post viral cough syndrome. Tessalon pearls given. Encouraged honey and tea. Continue on claritin. Do not feel like due to reflux. Consider ibuprofen to help get over cough.

## 2014-05-31 ENCOUNTER — Ambulatory Visit: Payer: Medicare Other | Admitting: Physician Assistant

## 2014-06-05 ENCOUNTER — Ambulatory Visit: Payer: Medicare Other | Admitting: Physician Assistant

## 2014-06-12 ENCOUNTER — Encounter: Payer: Self-pay | Admitting: Physician Assistant

## 2014-06-12 ENCOUNTER — Ambulatory Visit (INDEPENDENT_AMBULATORY_CARE_PROVIDER_SITE_OTHER): Payer: Medicare Other | Admitting: Physician Assistant

## 2014-06-12 VITALS — BP 111/73 | HR 85 | Wt 195.0 lb

## 2014-06-12 DIAGNOSIS — E669 Obesity, unspecified: Secondary | ICD-10-CM | POA: Diagnosis not present

## 2014-06-12 DIAGNOSIS — R635 Abnormal weight gain: Secondary | ICD-10-CM | POA: Diagnosis not present

## 2014-06-12 MED ORDER — PHENTERMINE HCL 37.5 MG PO TABS: 37.5000 mg | ORAL_TABLET | Freq: Every day | ORAL | Status: DC

## 2014-06-12 NOTE — Progress Notes (Signed)
   Subjective:    Patient ID: Nicole Giles, female    DOB: Oct 03, 1945, 69 y.o.   MRN: [id number]  HPI Pt presents to the clinic to follow up on weight. Taking phenetermine. No side effects except some mild constipation that resolves with activia yogurt. She is down another 6 lbs in one month. She has stopped sugar. Eating lean protein and drinking adkins shakes. Trying to walk more but no real organized exercise.    Review of Systems  All other systems reviewed and are negative.      Objective:   Physical Exam  Constitutional: She is oriented to person, place, and time. She appears well-developed and well-nourished.  HENT:  Head: Normocephalic and atraumatic.  Cardiovascular: Normal rate, regular rhythm and normal heart sounds.   Pulmonary/Chest: Effort normal and breath sounds normal.  Neurological: She is alert and oriented to person, place, and time.  Skin: Skin is dry.  Psychiatric: She has a normal mood and affect. Her behavior is normal.          Assessment & Plan:  Obesity/abnormal weight gain- doing well. More weight loss. Diet seems to be on track. Needs more exercise. Pt aware. Vitals look great. Refilled for 2 months then another follow up.

## 2014-08-12 ENCOUNTER — Ambulatory Visit (INDEPENDENT_AMBULATORY_CARE_PROVIDER_SITE_OTHER): Payer: Medicare Other | Admitting: Physician Assistant

## 2014-08-12 ENCOUNTER — Encounter: Payer: Self-pay | Admitting: Physician Assistant

## 2014-08-12 VITALS — BP 137/78 | HR 69 | Wt 189.0 lb

## 2014-08-12 DIAGNOSIS — M25561 Pain in right knee: Secondary | ICD-10-CM

## 2014-08-12 DIAGNOSIS — R635 Abnormal weight gain: Secondary | ICD-10-CM

## 2014-08-12 DIAGNOSIS — E669 Obesity, unspecified: Secondary | ICD-10-CM

## 2014-08-12 DIAGNOSIS — M1711 Unilateral primary osteoarthritis, right knee: Secondary | ICD-10-CM

## 2014-08-12 MED ORDER — AMBULATORY NON FORMULARY MEDICATION: Status: DC

## 2014-08-12 MED ORDER — LIRAGLUTIDE -WEIGHT MANAGEMENT 18 MG/3ML ~~LOC~~ SOPN: 3.0000 mg | PEN_INJECTOR | Freq: Every day | SUBCUTANEOUS | Status: DC

## 2014-08-12 MED ORDER — IBUPROFEN 800 MG PO TABS: 800.0000 mg | ORAL_TABLET | Freq: Three times a day (TID) | ORAL | Status: DC | PRN

## 2014-08-12 MED ORDER — PHENTERMINE HCL 37.5 MG PO TABS: ORAL_TABLET | ORAL | Status: DC

## 2014-08-12 NOTE — Progress Notes (Signed)
   Subjective:    Patient ID: Nicole Giles, female    DOB: 1946/01/12, 69 y.o.   MRN: [id number]  HPI  Pt is s 69 yo female who comes in to follow up on weight loss. She has lost another 6lbs in 2 months and overall 18lbs. She is taking full tablet of phentermine. Only side effect is occasional constipation. She denies any insomnia, palpitations, sOB, CP, dry mouth.   She does feel like her right knee has started to hurt worse. If she has been sitting for a while she is very stiff. Going up stairs is hard. Getting going in the morning is very hard some morning. No new trauma or injury. Not taking anything for pain. No recent xrays.     Review of Systems  All other systems reviewed and are negative.      Objective:   Physical Exam  Constitutional: She is oriented to person, place, and time. She appears well-developed and well-nourished.  Obesity.   HENT:  Head: Normocephalic and atraumatic.  Cardiovascular: Normal rate, regular rhythm and normal heart sounds.   Pulmonary/Chest: Effort normal and breath sounds normal. She has no wheezes.  Musculoskeletal:  Pain with full extension of right knee.  Pain with palpation over patella.  No joint tenderness.  Strength 5/5 of right knee/lower leg right.  Negative anterior drawer.  Negative mcmurrays.   Neurological: She is alert and oriented to person, place, and time.  Skin: Skin is dry.  Psychiatric: She has a normal mood and affect. Her behavior is normal.          Assessment & Plan:  Obesity/abnormal weight gain-been on phentermine for about 6 months. Discussed cutting phentermine in half daily and adding saxenda for long term help. This could be beneficial since she is pre-diabetic as well. Follow up in 1-2 months.   Right knee pain/osteoarthriits bilateral knees- offered xrays of knees. Pt declined. Likely she has some more arthritic changes. I also suspect some patellofemoral syndrome changes behind patellar. Given HO with  exercises. Gave ibuprofen 800mg  as needed up to three times a day. Discuss if any GI issues to stop. Take with meals. Pt also encouraged to get a sleeve for stability. If pain continues consider xrays and steroid injection in knee.

## 2014-08-13 ENCOUNTER — Telehealth: Payer: Self-pay | Admitting: *Deleted

## 2014-08-13 NOTE — Telephone Encounter (Signed)
Patient called stating that the cost of the Saxenda is $1200 Ask for [name] to give her a call. Will follow up with patient about insurance

## 2014-08-14 ENCOUNTER — Encounter: Payer: Self-pay | Admitting: Physician Assistant

## 2014-08-14 NOTE — Telephone Encounter (Signed)
Closing previous documentation

## 2014-08-14 NOTE — Patient Instructions (Signed)
Patellofemoral Syndrome If you have had pain in the front of your knee for a long time, chances are good that you have patellofemoral syndrome. The word patella refers to the kneecap. Femoral (or femur) refers to the thigh bone. That is the bone the kneecap sits on. The kneecap is shaped like a triangle. Its job is to protect the knee and to improve the efficiency of your thigh muscles (quadriceps). The underside of the kneecap is made of smooth tissue (cartilage). This lets the kneecap slide up and down as the knee moves. Sometimes this cartilage becomes soft. Your healthcare provider may say the cartilage breaks down. That is patellofemoral syndrome. It can affect one knee, or both. The condition is sometimes called patellofemoral pain syndrome. That is because the condition is painful. The pain usually gets worse with activity. Sitting for a long time with the knee bent also makes the pain worse. It usually gets better with rest and proper treatment. CAUSES  No one is sure why some people develop this problem and others do not. Runners often get it. One name for the condition is "runner's knee." However, some people run for years and never have knee pain. Certain things seem to make patellofemoral syndrome more likely. They include:  Moving out of alignment. The kneecap is supposed to move in a straight line when the thigh muscle pulls on it. Sometimes the kneecap moves in poor alignment. That can make the knee swell and hurt. Some experts believe it also wears down the cartilage.  Injury to the kneecap.  Strain on the knee. This may occur during sports activity. Soccer, running, skiing and cycling can put excess stress on the knee.  Being flat-footed or knock-kneed. SYMPTOMS   Knee pain.  Pain under the kneecap. This is usually a dull, aching pain.  Pain in the knee when doing certain things: squatting, kneeling, going up or down stairs.  Pain in the knee when you stand up after sitting down  for awhile.  Tightness in the knee.  Loss of muscle strength in the thigh.  Swelling of the knee. DIAGNOSIS  Healthcare providers often send people with knee pain to an orthopedic caregiver. This person has special training to treat problems with bones and joints. To decide what is causing your knee pain, your caregiver will probably:  Do a physical exam. This will probably include:  Asking about symptoms you have noticed.  Asking about your activities and any injuries.  Feeling your knee. Moving it. This will help test the knee's strength. It will also check alignment (whether the knee and leg are aligned normally).  Order some tests, such as:  Imaging tests. They create pictures of the inside of the knee. Tests may include:  X-rays.  Computed tomography (CT) scan. This uses X-rays and a computer to show more detail.  Magnetic resonance imaging (MRI). This test uses magnets, radio waves and a computer to make pictures. TREATMENT   Medication is almost always used first. It can relieve pain. It also can reduce swelling. Non-steroidal anti-inflammatory medicines (called NSAIDs) are usually suggested. Sometimes a stronger form is needed. A stronger form would require a prescription.  Other treatment may be needed after the swelling goes down. Possibilities include:  Exercise. Certain exercises can make the muscles around the knee stronger which decreases the pressure on the knee cap. This includes the thigh muscle. Certain exercises also may be suggested to increase your flexibility.  A knee brace. This gives the knee extra support  and helps align the movement of the knee cap.  Orthotics. These are special shoe inserts. They can help keep your leg and knee aligned.  Surgery is sometimes needed. This is rare. Options include:  Arthroscopy. The surgeon uses a special tool to remove any damaged pieces of the kneecap. Only a few small incisions (cuts) are needed.  Realignment.  This is open surgery. The goals are to reduce pressure and fix the way the kneecap moves. HOME CARE INSTRUCTIONS   Take any medication prescribed by your healthcare provider. Follow the directions carefully.  If your knee is swollen:  Put ice or cold packs on it. Do this for 20 to 30 minutes, 3 to 4 times a day.  Keep the knee raised. Make sure it is supported. Put a pillow under it.  Rest your knee. For example, take the elevator instead of the stairs for awhile. Or, take a break from sports activity that strain your knee. Try walking or swimming instead.  Whenever you are active:  Use an elastic bandage on your knee. This gives it support.  After any activity, put ice or cold packs on your knees. Do this for about 10 to 20 minutes.  Make sure you wear shoes that give good support. Make sure they are not worn down. The heels should not slant in or out. SEEK MEDICAL CARE IF:   Knee pain gets worse. Or it does not go away, even after taking pain medicine.  Swelling does not go down.  Your thigh muscle becomes weak.  You have an oral temperature above 102 F (38.9 C). SEEK IMMEDIATE MEDICAL CARE IF:  You have an oral temperature above 102 F (38.9 C), not controlled by medicine. Document Released: 04/14/2009 Document Revised: 07/19/2011 Document Reviewed: 07/16/2013 [facility] Patient Information 2015 [place], [place]. This information is not intended to replace advice given to you by your health care provider. Make sure you discuss any questions you have with your health care provider.

## 2014-08-15 ENCOUNTER — Telehealth: Payer: Self-pay | Admitting: Physician Assistant

## 2014-08-15 DIAGNOSIS — M1711 Unilateral primary osteoarthritis, right knee: Secondary | ICD-10-CM | POA: Insufficient documentation

## 2014-08-15 NOTE — Telephone Encounter (Signed)
I looked up pt demographics-she's medicare.  I left her a vm letting her know that you may have to send phentermine in for her & for her to call back if that was ok.

## 2014-08-15 NOTE — Telephone Encounter (Signed)
She has enough for one month phentermine because I gave her a month to overlap.

## 2014-08-15 NOTE — Telephone Encounter (Signed)
Pt called and stated saxenda was too expensive. i confirmed with rep that card does not work without insurance. Does pt have insurance? If she does need to try card I just got. If no insurance no branded drugs are going to be affordable. We could do phentermine as long as losing weight and no side effects.

## 2014-08-16 NOTE — Telephone Encounter (Signed)
Left message advising patient to start the phentermine as directed.

## 2014-08-19 ENCOUNTER — Ambulatory Visit (INDEPENDENT_AMBULATORY_CARE_PROVIDER_SITE_OTHER): Payer: Medicare Other | Admitting: Physician Assistant

## 2014-08-19 ENCOUNTER — Encounter: Payer: Self-pay | Admitting: Physician Assistant

## 2014-08-19 VITALS — BP 104/70 | HR 75 | Wt 189.0 lb

## 2014-08-19 DIAGNOSIS — J069 Acute upper respiratory infection, unspecified: Secondary | ICD-10-CM | POA: Diagnosis not present

## 2014-08-19 DIAGNOSIS — R062 Wheezing: Secondary | ICD-10-CM | POA: Diagnosis not present

## 2014-08-19 MED ORDER — AZITHROMYCIN 250 MG PO TABS: ORAL_TABLET | ORAL | Status: DC

## 2014-08-19 MED ORDER — BENZONATATE 200 MG PO CAPS: 200.0000 mg | ORAL_CAPSULE | Freq: Three times a day (TID) | ORAL | Status: DC | PRN

## 2014-08-19 MED ORDER — PHENTERMINE HCL 37.5 MG PO TABS: ORAL_TABLET | ORAL | Status: DC

## 2014-08-19 MED ORDER — IPRATROPIUM-ALBUTEROL 0.5-2.5 (3) MG/3ML IN SOLN
3.0000 mL | Freq: Once | RESPIRATORY_TRACT | Status: AC
  Administered 2014-08-19: 3 mL via RESPIRATORY_TRACT

## 2014-08-19 MED ORDER — IBUPROFEN 800 MG PO TABS: 800.0000 mg | ORAL_TABLET | Freq: Three times a day (TID) | ORAL | Status: DC | PRN

## 2014-08-19 MED ORDER — IPRATROPIUM-ALBUTEROL 0.5-2.5 (3) MG/3ML IN SOLN: 3.0000 mL | RESPIRATORY_TRACT | Status: DC

## 2014-08-19 NOTE — Progress Notes (Signed)
   Subjective:    Patient ID: Nicole Giles, female    DOB: 26-Feb-1946, 69 y.o.   MRN: [id number]  HPI  Last Thursday, approximately 5 days ago symptoms started with cough, ST, headache, bilateral ear pain. It has progressed to facial pain, wheezing, SOB, chest tightness and productive cough. Sputum is green. Can't sleep at night due to cough. No known wheezing. Not taken temperature but has felt hot to touch. She has taken mucinex and ibuprofen which have helped some.     Review of Systems  All other systems reviewed and are negative.      Objective:   Physical Exam  Constitutional: She is oriented to person, place, and time. She appears well-developed and well-nourished.  HENT:  Head: Normocephalic and atraumatic.  Right Ear: External ear normal.  Left Ear: External ear normal.  Nose: Nose normal.  Mouth/Throat: Oropharynx is clear and moist. No oropharyngeal exudate.  Eyes: Conjunctivae are normal. Right eye exhibits no discharge. Left eye exhibits no discharge.  Neck: Normal range of motion. Neck supple.  Cardiovascular: Normal rate, regular rhythm and normal heart sounds.   Pulmonary/Chest:  Decreased effort.  Bilateral wheezing at base of lungs.  No rhonchi  Lymphadenopathy:    She has no cervical adenopathy.  Neurological: She is alert and oriented to person, place, and time.  Skin:  Flushed cheeks bilaterally.   Psychiatric: She has a normal mood and affect. Her behavior is normal.          Assessment & Plan:  Acute upper respiratory infection/wheezing- duoneb given in office today. Pt was very jittery and out of breath after treatment. She became tachycardic at around 110 quickly came down within 10 minutes to 88. Discussed side effects of medications. Will use caution if feel like pt needs again. Treated with zpak tessalon pearles for cough. Discussed OTC delsym. Follow up if not improving.   Obesity/abnormal weight gain- pt not able to afford saxenda due to no  insurance. Gave another month of phentermine needs to follow up 2 months for weight and vitals check.

## 2014-08-19 NOTE — Patient Instructions (Signed)
Upper Respiratory Infection, Adult An upper respiratory infection (URI) is also sometimes known as the common cold. The upper respiratory tract includes the nose, sinuses, throat, trachea, and bronchi. Bronchi are the airways leading to the lungs. Most people improve within 1 week, but symptoms can last up to 2 weeks. A residual cough may last even longer.  CAUSES Many different viruses can infect the tissues lining the upper respiratory tract. The tissues become irritated and inflamed and often become very moist. Mucus production is also common. A cold is contagious. You can easily spread the virus to others by oral contact. This includes kissing, sharing a glass, coughing, or sneezing. Touching your mouth or nose and then touching a surface, which is then touched by another person, can also spread the virus. SYMPTOMS  Symptoms typically develop 1 to 3 days after you come in contact with a cold virus. Symptoms vary from person to person. They may include:  Runny nose.  Sneezing.  Nasal congestion.  Sinus irritation.  Sore throat.  Loss of voice (laryngitis).  Cough.  Fatigue.  Muscle aches.  Loss of appetite.  Headache.  Low-grade fever. DIAGNOSIS  You might diagnose your own cold based on familiar symptoms, since most people get a cold 2 to 3 times a year. Your caregiver can confirm this based on your exam. Most importantly, your caregiver can check that your symptoms are not due to another disease such as strep throat, sinusitis, pneumonia, asthma, or epiglottitis. Blood tests, throat tests, and X-rays are not necessary to diagnose a common cold, but they may sometimes be helpful in excluding other more serious diseases. Your caregiver will decide if any further tests are required. RISKS AND COMPLICATIONS  You may be at risk for a more severe case of the common cold if you smoke cigarettes, have chronic heart disease (such as heart failure) or lung disease (such as asthma), or if  you have a weakened immune system. The very young and very old are also at risk for more serious infections. Bacterial sinusitis, middle ear infections, and bacterial pneumonia can complicate the common cold. The common cold can worsen asthma and chronic obstructive pulmonary disease (COPD). Sometimes, these complications can require emergency medical care and may be life-threatening. PREVENTION  The best way to protect against getting a cold is to practice good hygiene. Avoid oral or hand contact with people with cold symptoms. Wash your hands often if contact occurs. There is no clear evidence that vitamin C, vitamin E, echinacea, or exercise reduces the chance of developing a cold. However, it is always recommended to get plenty of rest and practice good nutrition. TREATMENT  Treatment is directed at relieving symptoms. There is no cure. Antibiotics are not effective, because the infection is caused by a virus, not by bacteria. Treatment may include:  Increased fluid intake. Sports drinks offer valuable electrolytes, sugars, and fluids.  Breathing heated mist or steam (vaporizer or shower).  Eating chicken soup or other clear broths, and maintaining good nutrition.  Getting plenty of rest.  Using gargles or lozenges for comfort.  Controlling fevers with ibuprofen or acetaminophen as directed by your caregiver.  Increasing usage of your inhaler if you have asthma. Zinc gel and zinc lozenges, taken in the first 24 hours of the common cold, can shorten the duration and lessen the severity of symptoms. Pain medicines may help with fever, muscle aches, and throat pain. A variety of non-prescription medicines are available to treat congestion and runny nose. Your caregiver   can make recommendations and may suggest nasal or lung inhalers for other symptoms.  HOME CARE INSTRUCTIONS   Only take over-the-counter or prescription medicines for pain, discomfort, or fever as directed by your  caregiver.  Use a warm mist humidifier or inhale steam from a shower to increase air moisture. This may keep secretions moist and make it easier to breathe.  Drink enough water and fluids to keep your urine clear or pale yellow.  Rest as needed.  Return to work when your temperature has returned to normal or as your caregiver advises. You may need to stay home longer to avoid infecting others. You can also use a face mask and careful hand washing to prevent spread of the virus. SEEK MEDICAL CARE IF:   After the first few days, you feel you are getting worse rather than better.  You need your caregiver's advice about medicines to control symptoms.  You develop chills, worsening shortness of breath, or brown or red sputum. These may be signs of pneumonia.  You develop yellow or brown nasal discharge or pain in the face, especially when you bend forward. These may be signs of sinusitis.  You develop a fever, swollen neck glands, pain with swallowing, or white areas in the back of your throat. These may be signs of strep throat. SEEK IMMEDIATE MEDICAL CARE IF:   You have a fever.  You develop severe or persistent headache, ear pain, sinus pain, or chest pain.  You develop wheezing, a prolonged cough, cough up blood, or have a change in your usual mucus (if you have chronic lung disease).  You develop sore muscles or a stiff neck. Document Released: 10/20/2000 Document Revised: 07/19/2011 Document Reviewed: 08/01/2013 [employer] Patient Information 2015 [employer]. This information is not intended to replace advice given to you by your health care provider. Make sure you discuss any questions you have with your health care provider.  

## 2014-09-04 ENCOUNTER — Ambulatory Visit (INDEPENDENT_AMBULATORY_CARE_PROVIDER_SITE_OTHER): Payer: Medicare Other | Admitting: Physician Assistant

## 2014-09-04 ENCOUNTER — Encounter: Payer: Self-pay | Admitting: Physician Assistant

## 2014-09-04 ENCOUNTER — Ambulatory Visit (INDEPENDENT_AMBULATORY_CARE_PROVIDER_SITE_OTHER): Payer: Medicare Other

## 2014-09-04 VITALS — BP 99/51 | HR 79 | Ht 60.0 in | Wt 192.0 lb

## 2014-09-04 DIAGNOSIS — R05 Cough: Secondary | ICD-10-CM

## 2014-09-04 DIAGNOSIS — R059 Cough, unspecified: Secondary | ICD-10-CM

## 2014-09-04 MED ORDER — PREDNISONE 20 MG PO TABS
ORAL_TABLET | ORAL | Status: DC
Start: 2014-09-04 — End: 2015-12-31

## 2014-09-04 NOTE — Patient Instructions (Signed)
chestal at bedtime.  Prednisone taper.  Start back on clartin.  Consider flonase.

## 2014-09-04 NOTE — Progress Notes (Signed)
   Subjective:    Patient ID: Nicole Giles, female    DOB: January 30, 1946, 69 y.o.   MRN: [id number]  HPI  Pt presents to the clinic with persistent cough. She has minimal production and mostly in am of whitish to clear sputum. Finished zpak with some relief. Tessalon pearles do help. No fever but broke out in a sweat 3 nights ago.she has been working non-stop with long hours. Her chest is hurting due to all the coughing.     Review of Systems  All other systems reviewed and are negative.      Objective:   Physical Exam  Constitutional: She appears well-developed and well-nourished.  HENT:  Head: Normocephalic and atraumatic.  Right Ear: External ear normal.  Left Ear: External ear normal.  Nose: Nose normal.  Mouth/Throat: Oropharynx is clear and moist. No oropharyngeal exudate.  Eyes: Conjunctivae are normal. Right eye exhibits no discharge. Left eye exhibits no discharge.  Neck: Normal range of motion. Neck supple.  Cardiovascular: Normal rate, regular rhythm and normal heart sounds.   Pulmonary/Chest: Effort normal and breath sounds normal. She has no wheezes.  Lymphadenopathy:    She has no cervical adenopathy.  Skin: Skin is dry.  Psychiatric: She has a normal mood and affect. Her behavior is normal.          Assessment & Plan:  Cough- no signs of bacterial infection. Since cough persistent will get CXR. Started prednisone pack with tessalon pearles for any allergic component. Discussed some flonase with zyrtec/claritin daily as well. HO given for cough. Pt declines any acid reflux. I do think this represents a post-infectious cough. Pt does not tolerate codiene. Continue to suck on hard candy. Consider honey preparations for cough during the day and at night.

## 2014-09-06 ENCOUNTER — Ambulatory Visit: Payer: Medicare Other | Admitting: Physician Assistant

## 2014-10-03 DIAGNOSIS — H25019 Cortical age-related cataract, unspecified eye: Secondary | ICD-10-CM | POA: Diagnosis not present

## 2014-10-03 DIAGNOSIS — H2513 Age-related nuclear cataract, bilateral: Secondary | ICD-10-CM | POA: Diagnosis not present

## 2014-11-18 ENCOUNTER — Ambulatory Visit: Payer: Medicare Other | Admitting: Family Medicine

## 2015-05-21 DIAGNOSIS — H1045 Other chronic allergic conjunctivitis: Secondary | ICD-10-CM | POA: Diagnosis not present

## 2015-12-31 ENCOUNTER — Encounter: Payer: Self-pay | Admitting: Physician Assistant

## 2015-12-31 ENCOUNTER — Ambulatory Visit (INDEPENDENT_AMBULATORY_CARE_PROVIDER_SITE_OTHER): Payer: Commercial Managed Care - HMO | Admitting: Physician Assistant

## 2015-12-31 VITALS — BP 148/63 | HR 72 | Ht 60.0 in | Wt 218.0 lb

## 2015-12-31 DIAGNOSIS — R7303 Prediabetes: Secondary | ICD-10-CM | POA: Diagnosis not present

## 2015-12-31 DIAGNOSIS — R600 Localized edema: Secondary | ICD-10-CM | POA: Insufficient documentation

## 2015-12-31 DIAGNOSIS — Z13220 Encounter for screening for lipoid disorders: Secondary | ICD-10-CM | POA: Diagnosis not present

## 2015-12-31 DIAGNOSIS — M1711 Unilateral primary osteoarthritis, right knee: Secondary | ICD-10-CM

## 2015-12-31 DIAGNOSIS — Z131 Encounter for screening for diabetes mellitus: Secondary | ICD-10-CM | POA: Diagnosis not present

## 2015-12-31 DIAGNOSIS — R609 Edema, unspecified: Secondary | ICD-10-CM | POA: Insufficient documentation

## 2015-12-31 DIAGNOSIS — R030 Elevated blood-pressure reading, without diagnosis of hypertension: Secondary | ICD-10-CM

## 2015-12-31 DIAGNOSIS — E785 Hyperlipidemia, unspecified: Secondary | ICD-10-CM

## 2015-12-31 DIAGNOSIS — IMO0001 Reserved for inherently not codable concepts without codable children: Secondary | ICD-10-CM | POA: Insufficient documentation

## 2015-12-31 MED ORDER — MELOXICAM 15 MG PO TABS: 15.0000 mg | ORAL_TABLET | Freq: Every day | ORAL | 1 refills | Status: DC

## 2015-12-31 MED ORDER — HYDROCHLOROTHIAZIDE 25 MG PO TABS: 25.0000 mg | ORAL_TABLET | Freq: Every day | ORAL | 0 refills | Status: DC

## 2015-12-31 MED ORDER — ALPRAZOLAM 0.5 MG PO TABS: ORAL_TABLET | ORAL | 0 refills | Status: DC

## 2015-12-31 NOTE — Addendum Note (Signed)
Addended by: Donella Stade on: 12/31/2015 11:58 AM   Modules accepted: Orders

## 2015-12-31 NOTE — Patient Instructions (Signed)
Consider appt with Dr. Darene Lamer for knee injection.

## 2015-12-31 NOTE — Progress Notes (Addendum)
   Subjective:    Patient ID: Nicole Giles, female    DOB: 23-Aug-1945, 70 y.o.   MRN: [id number]  HPI Patient is a 70 year old female who presents to the clinic with bilateral lower extremity edema. She's noticed this off and on over the summer. Seems to be worse over the past few days. Usually the swelling get worse as the day progresses and then resolve in the morning. For the last few days it has not resolved as much in the morning. She denies any shortness of breath, palpitations, chest pain.  Patient has gained all her weight back since being on phentermine for approximately a year. She has a history of prediabetes and hyperlipidemia. She complains that her right knee pain has worsened. She's not exercising and she no she is eating more.   Review of Systems See history of present illness    Objective:   Physical Exam  Constitutional: She is oriented to person, place, and time. She appears well-developed and well-nourished.  Morbid obesity.   HENT:  Head: Normocephalic and atraumatic.  Cardiovascular: Normal rate, regular rhythm and normal heart sounds.   Pulmonary/Chest: Effort normal and breath sounds normal.  Neurological: She is alert and oriented to person, place, and time.  Skin:  Scant non-pitting edema bilaterally around ankles and forefoot.   Psychiatric: She has a normal mood and affect. Her behavior is normal.          Assessment & Plan:  Bilateral lower extremity edema-discuss causes of this. Reassured her I do not think this is congestive heart failure. Encouraged symptomatic care with compression stockings, elevation and decreasing salt in diet. Will start HCTZ 25 mg daily. Will follow-up in one month.  Elevated blood pressure-since patient is having swelling and elevation of blood pressure will start HCTZ 25 mg daily. Will follow-up in one month. We'll check CMP in the next 3 weeks.  Morbid obesity-discuss with patient I certainly think obesity is causing right  knee pain to be worse. I would like for patient to consider seeing sports medicine for injections for right knee pain and then follow-up in one month and we can discuss medications. The only medication she can afford his phentermine. Her insurance will not approve other weight loss medications. Referral made for nutritionist.   Primary osteoarthritis of right knee-I would like for patient to follow-up with Dr. Darene Lamer for injection. I think this would help her exercise more and potentially be better for weight loss. She is very anxious about injections. I did give HER-2 Xanax to take 20 minutes before procedure. Discussed she may need someone to drive her.  Prediabetes/hyperlipidemia-patient needed screening labs. They were ordered today to get done in 3 weeks. Patient was advised to be fasting.

## 2016-05-20 DIAGNOSIS — Z0100 Encounter for examination of eyes and vision without abnormal findings: Secondary | ICD-10-CM | POA: Diagnosis not present

## 2016-06-03 ENCOUNTER — Other Ambulatory Visit: Payer: Self-pay | Admitting: Physician Assistant

## 2016-06-07 ENCOUNTER — Other Ambulatory Visit: Payer: Self-pay | Admitting: Physician Assistant

## 2016-07-15 ENCOUNTER — Other Ambulatory Visit: Payer: Self-pay | Admitting: Physician Assistant

## 2016-07-16 ENCOUNTER — Other Ambulatory Visit: Payer: Self-pay | Admitting: Physician Assistant

## 2016-07-28 ENCOUNTER — Ambulatory Visit: Payer: Commercial Managed Care - HMO

## 2016-08-09 ENCOUNTER — Ambulatory Visit (INDEPENDENT_AMBULATORY_CARE_PROVIDER_SITE_OTHER): Payer: Medicare HMO | Admitting: Physician Assistant

## 2016-08-09 VITALS — BP 127/84 | HR 84 | Temp 98.4°F | Wt 214.0 lb

## 2016-08-09 DIAGNOSIS — N12 Tubulo-interstitial nephritis, not specified as acute or chronic: Secondary | ICD-10-CM | POA: Diagnosis not present

## 2016-08-09 DIAGNOSIS — M353 Polymyalgia rheumatica: Secondary | ICD-10-CM | POA: Diagnosis not present

## 2016-08-09 DIAGNOSIS — R7303 Prediabetes: Secondary | ICD-10-CM | POA: Diagnosis not present

## 2016-08-09 DIAGNOSIS — R300 Dysuria: Secondary | ICD-10-CM | POA: Diagnosis not present

## 2016-08-09 LAB — POCT URINALYSIS DIPSTICK
BILIRUBIN UA: NEGATIVE
GLUCOSE UA: NEGATIVE
Ketones, UA: NEGATIVE
Nitrite, UA: POSITIVE
SPEC GRAV UA: 1.015 (ref 1.030–1.035)
Urobilinogen, UA: 0.2 (ref ?–2.0)
pH, UA: 6 (ref 5.0–8.0)

## 2016-08-09 MED ORDER — CEFTRIAXONE SODIUM 1 G IJ SOLR
1.0000 g | Freq: Once | INTRAMUSCULAR | Status: AC
  Administered 2016-08-09: 1 g via INTRAMUSCULAR

## 2016-08-09 MED ORDER — LEVOFLOXACIN 750 MG PO TABS: 750.0000 mg | ORAL_TABLET | Freq: Every day | ORAL | 0 refills | Status: DC

## 2016-08-09 NOTE — Patient Instructions (Addendum)
Take antibiotic as prescribed for 5 days Tylenol 650mg  every 6 hours (do not exceed 3000mg  in 24 hours) Heatpack Drink at least 1L of clear liquids per day - take small sips throughout the day   Pyelonephritis, Adult Pyelonephritis is a kidney infection. The kidneys are the organs that filter a person's blood and move waste out of the bloodstream and into the urine. Urine passes from the kidneys, through the ureters, and into the bladder. There are two main types of pyelonephritis:  Infections that come on quickly without any warning (acute pyelonephritis).  Infections that last for a long period of time (chronic pyelonephritis). In most cases, the infection clears up with treatment and does not cause further problems. More severe infections or chronic infections can sometimes spread to the bloodstream or lead to other problems with the kidneys. What are the causes? This condition is usually caused by:  Bacteria traveling from the bladder to the kidney through infected urine. The urine in the bladder can become infected with bacteria from:  Bladder infection (cystitis).  Inflammation of the prostate gland (prostatitis).  Sexual intercourse, in females.  Bacteria traveling from the bloodstream to the kidney. What increases the risk? This condition is more likely to develop in:  Pregnant women.  Older people.  People who have diabetes.  People who have kidney stones or bladder stones.  People who have other abnormalities of the kidney or ureter.  People who have a catheter placed in the bladder.  People who have cancer.  People who are sexually active.  Women who use spermicides.  People who have had a prior urinary tract infection. What are the signs or symptoms? Symptoms of this condition include:  Frequent urination.  Strong or persistent urge to urinate.  Burning or stinging when urinating.  Abdominal pain.  Back pain.  Pain in the side or flank  area.  Fever.  Chills.  Blood in the urine, or dark urine.  Nausea.  Vomiting. How is this diagnosed? This condition may be diagnosed based on:  Medical history and physical exam.  Urine tests.  Blood tests. You may also have imaging tests of the kidneys, such as an ultrasound or CT scan. How is this treated? Treatment for this condition may depend on the severity of the infection.  If the infection is mild and is found early, you may be treated with antibiotic medicines taken by mouth. You will need to drink fluids to remain hydrated.  If the infection is more severe, you may need to stay in the hospital and receive antibiotics given directly into a vein through an IV tube. You may also need to receive fluids through an IV tube if you are not able to remain hydrated. After your hospital stay, you may need to take oral antibiotics for a period of time. Other treatments may be required, depending on the cause of the infection. Follow these instructions at home: Medicines   Take over-the-counter and prescription medicines only as told by your health care provider.  If you were prescribed an antibiotic medicine, take it as told by your health care provider. Do not stop taking the antibiotic even if you start to feel better. General instructions   Drink enough fluid to keep your urine clear or pale yellow.  Avoid caffeine, tea, and carbonated beverages. They tend to irritate the bladder.  Urinate often. Avoid holding in urine for long periods of time.  Urinate before and after sex.  After a bowel movement, women should  cleanse from front to back. Use each tissue only once.  Keep all follow-up visits as told by your health care provider. This is important. Contact a health care provider if:  Your symptoms do not get better after 2 days of treatment.  Your symptoms get worse.  You have a fever. Get help right away if:  You are unable to take your antibiotics or  fluids.  You have shaking chills.  You vomit.  You have severe flank or back pain.  You have extreme weakness or fainting. This information is not intended to replace advice given to you by your health care provider. Make sure you discuss any questions you have with your health care provider. Document Released: 04/26/2005 Document Revised: 10/02/2015 Document Reviewed: 08/19/2014 Elsevier Interactive Patient Education  2017 [place].  Muscle Pain, Adult Muscle pain (myalgia) may be mild or severe. In most cases, the pain lasts only a short time and it goes away without treatment. It is normal to feel some muscle pain after starting a workout program. Muscles that have not been used often will be sore at first. Muscle pain may also be caused by many other things, including:  Overuse or muscle strain, especially if you are not in shape. This is the most common cause of muscle pain.  Injury.  Bruises.  Viruses, such as the flu.  Infectious diseases.  A chronic condition that causes muscle tenderness, fatigue, and headache (fibromyalgia).  A condition, such as lupus, in which the body's disease-fighting system attacks other organs in the body (autoimmune or rheumatologic diseases).  Certain drugs, including ACE inhibitors and statins. To diagnose the cause of your muscle pain, your health care provider will do a physical exam and ask questions about the pain and when it began. If you have not had muscle pain for very long, your health care provider may want to wait before doing much testing. If your muscle pain has lasted a long time, your health care provider may want to run tests right away. In some cases, this may include tests to rule out certain conditions or illnesses. Treatment for muscle pain depends on the cause. Home care is often enough to relieve muscle pain. Your health care provider may also prescribe anti-inflammatory medicine. Follow these instructions at  home: Activity   If overuse is causing your muscle pain:  Slow down your activities until the pain goes away.  Do regular, gentle exercises if you are not usually active.  Warm up before exercising. Stretch before and after exercising. This can help lower the risk of muscle pain.  Do not continue working out if the pain is very bad. Bad pain could mean that you have injured a muscle. Managing pain and discomfort    If directed, apply ice to the sore muscle:  Put ice in a plastic bag.  Place a towel between your skin and the bag.  Leave the ice on for 20 minutes, 2-3 times a day.  You may also alternate between applying ice and applying heat as told by your health care provider. To apply heat, use the heat source that your health care provider recommends, such as a moist heat pack or a heating pad.  Place a towel between your skin and the heat source.  Leave the heat on for 20-30 minutes.  Remove the heat if your skin turns bright red. This is especially important if you are unable to feel pain, heat, or cold. You may have a greater risk of  getting burned. Medicines   Take over-the-counter and prescription medicines only as told by your health care provider.  Do not drive or use heavy machinery while taking prescription pain medicine. Contact a health care provider if:  Your muscle pain gets worse and medicines do not help.  You have muscle pain that lasts longer than 3 days.  You have a rash or fever along with muscle pain.  You have muscle pain after a tick bite.  You have muscle pain while working out, even though you are in good physical condition.  You have redness, soreness, or swelling along with muscle pain.  You have muscle pain after starting a new medicine or changing the dose of a medicine. Get help right away if:  You have trouble breathing.  You have trouble swallowing.  You have muscle pain along with a stiff neck, fever, and vomiting.  You have  severe muscle weakness or cannot move part of your body. This information is not intended to replace advice given to you by your health care provider. Make sure you discuss any questions you have with your health care provider. Document Released: 03/18/2006 Document Revised: 11/14/2015 Document Reviewed: 09/16/2015 Elsevier Interactive Patient Education  2017 [employer].

## 2016-08-09 NOTE — Progress Notes (Signed)
HPI:                                                                Nicole Giles is a 71 y.o. female who presents to [facility] today for myalgias and hematuria  Patient with PMH of prediabetes, HLD, obesity, and PE (2008) complains of myalgias "all over" and malaise beginning 5 days ago. Endorses bilateral back pain and chills. She states she had blood in her urine yesterday and it had a foul odor. Denies dysuria, frequency or urgency.   Past Medical History:  Diagnosis Date  . H/O blood clots    in left leg after left arm surgery in 05/2006 ----- off coumadin   Past Surgical History:  Procedure Laterality Date  . CHOLECYSTECTOMY    . CYSTECTOMY    . left arm fracture    . right knee surgery     Social History  Substance Use Topics  . Smoking status: Former Research scientist (life sciences)  . Smokeless tobacco: Not on file  . Alcohol use No   family history includes Diabetes in her brother.  ROS: negative except as noted in the HPI  Medications: Current Outpatient Prescriptions  Medication Sig Dispense Refill  . ALPRAZolam (XANAX) 0.5 MG tablet To take 20 minutes before procedure. 2 tablet 0  . hydrochlorothiazide (HYDRODIURIL) 25 MG tablet Take 1 tablet (25 mg total) by mouth daily. Must make appointment for future refills. 15 tablet 0  . ibuprofen (ADVIL,MOTRIN) 800 MG tablet Take 1 tablet (800 mg total) by mouth every 8 (eight) hours as needed. 60 tablet 0  . meloxicam (MOBIC) 15 MG tablet Take 1 tablet (15 mg total) by mouth daily. 30 tablet 1  . levofloxacin (LEVAQUIN) 750 MG tablet Take 1 tablet (750 mg total) by mouth daily. 5 tablet 0   No current facility-administered medications for this visit.    Allergies  Allergen Reactions  . Codeine   . Meperidine Hcl        Objective:  BP 127/84   Pulse 84   Temp 98.4 F (36.9 C) (Oral)   Wt 214 lb (97.1 kg)   SpO2 96%   BMI 41.79 kg/m  Gen: well-groomed, cooperative, ill-appearing,  not toxic appearing, no distress HEENT: normal conjunctiva, TM's clear, oropharynx clear, moist mucus membranes, no frontal or maxillary sinus tenderness Pulm: Normal work of breathing, normal phonation, clear to auscultation bilaterally, no wheezes, rales or rhonchi CV: Normal rate, regular rhythm, s1 and s2 distinct, no murmurs, clicks or rubs  GI: soft, nondistended, nontender, there is CVA tenderness bilaterally Neuro: alert and oriented x 3, EOM's intact MSK: multiple trigger points, heberden's nodes in hands bilaterally, normal gait and station Lymph: no cervical or tonsillar adenopathy Skin: warm and dry, no rashes or lesions on exposed skin, no cyanosis   Assessment and Plan: 71 y.o. female with   Pyelonephritis - UA positive for small blood, nitrites, and large leukocytes - cefTRIAXone (ROCEPHIN) injection 1 g; Inject 1 g into the muscle once. - levofloxacin (LEVAQUIN) 750 MG tablet; Take 1 tablet (750 mg total) by mouth daily.  Dispense: 5 tablet; Refill: 0 - Urine culture pending  Polymyalgia ([place]) - likely sequelae of complicated UTI, but ordering labs to rule out other  inflammatory disorder - Tylenol 650mg  every 6 hours (do not exceed 3000mg  in 24 hours) - symptomatic management with heatpack and Tylenol - ANA - CBC with Differential/Platelet - CK - Comprehensive metabolic panel - C-reactive protein - Cyclic citrul peptide antibody, IgG - Rheumatoid factor - Sedimentation rate - Hemoglobin A1c   Patient education and anticipatory guidance given Patient agrees with treatment plan Follow-up with PCP in 1 week or sooner as needed  Darlyne Russian PA-C

## 2016-08-10 LAB — CBC WITH DIFFERENTIAL/PLATELET
BASOS PCT: 1 %
Basophils Absolute: 74 cells/uL (ref 0–200)
Eosinophils Absolute: 148 cells/uL (ref 15–500)
Eosinophils Relative: 2 %
HEMATOCRIT: 39.9 % (ref 35.0–45.0)
HEMOGLOBIN: 13.8 g/dL (ref 11.7–15.5)
LYMPHS ABS: 2590 {cells}/uL (ref 850–3900)
Lymphocytes Relative: 35 %
MCH: 33.8 pg — ABNORMAL HIGH (ref 27.0–33.0)
MCHC: 34.6 g/dL (ref 32.0–36.0)
MCV: 97.8 fL (ref 80.0–100.0)
MONO ABS: 666 {cells}/uL (ref 200–950)
MPV: 10 fL (ref 7.5–12.5)
Monocytes Relative: 9 %
NEUTROS PCT: 53 %
Neutro Abs: 3922 cells/uL (ref 1500–7800)
Platelets: 326 10*3/uL (ref 140–400)
RBC: 4.08 MIL/uL (ref 3.80–5.10)
RDW: 12.5 % (ref 11.0–15.0)
WBC: 7.4 10*3/uL (ref 3.8–10.8)

## 2016-08-10 LAB — COMPREHENSIVE METABOLIC PANEL
ALBUMIN: 3.8 g/dL (ref 3.6–5.1)
ALT: 30 U/L — ABNORMAL HIGH (ref 6–29)
AST: 34 U/L (ref 10–35)
Alkaline Phosphatase: 100 U/L (ref 33–130)
BILIRUBIN TOTAL: 0.9 mg/dL (ref 0.2–1.2)
BUN: 8 mg/dL (ref 7–25)
CO2: 23 mmol/L (ref 20–31)
CREATININE: 0.73 mg/dL (ref 0.50–0.99)
Calcium: 9.1 mg/dL (ref 8.6–10.4)
Chloride: 101 mmol/L (ref 98–110)
Glucose, Bld: 140 mg/dL — ABNORMAL HIGH (ref 65–99)
Potassium: 3.8 mmol/L (ref 3.5–5.3)
SODIUM: 136 mmol/L (ref 135–146)
TOTAL PROTEIN: 6.6 g/dL (ref 6.1–8.1)

## 2016-08-10 LAB — ANA: Anti Nuclear Antibody(ANA): NEGATIVE

## 2016-08-10 LAB — CYCLIC CITRUL PEPTIDE ANTIBODY, IGG

## 2016-08-10 LAB — HEMOGLOBIN A1C
Hgb A1c MFr Bld: 5.9 % — ABNORMAL HIGH (ref <5.7)
Mean Plasma Glucose: 123 mg/dL

## 2016-08-10 LAB — CK: Total CK: 60 U/L (ref 7–177)

## 2016-08-10 LAB — C-REACTIVE PROTEIN: CRP: 50.5 mg/L — ABNORMAL HIGH (ref <8.0)

## 2016-08-10 LAB — SEDIMENTATION RATE: Sed Rate: 77 mm/hr — ABNORMAL HIGH (ref 0–30)

## 2016-08-10 LAB — RHEUMATOID FACTOR: Rhuematoid fact SerPl-aCnc: <14 IU/mL (ref <14)

## 2016-08-11 DIAGNOSIS — M353 Polymyalgia rheumatica: Secondary | ICD-10-CM

## 2016-08-11 HISTORY — DX: Polymyalgia rheumatica: M35.3

## 2016-08-12 LAB — URINE CULTURE

## 2016-08-16 ENCOUNTER — Encounter: Payer: Self-pay | Admitting: Physician Assistant

## 2016-08-16 ENCOUNTER — Ambulatory Visit (INDEPENDENT_AMBULATORY_CARE_PROVIDER_SITE_OTHER): Payer: Medicare HMO | Admitting: Physician Assistant

## 2016-08-16 VITALS — BP 123/59 | HR 73

## 2016-08-16 DIAGNOSIS — R5383 Other fatigue: Secondary | ICD-10-CM

## 2016-08-16 DIAGNOSIS — M791 Myalgia, unspecified site: Secondary | ICD-10-CM

## 2016-08-16 DIAGNOSIS — Z1321 Encounter for screening for nutritional disorder: Secondary | ICD-10-CM | POA: Diagnosis not present

## 2016-08-16 DIAGNOSIS — N12 Tubulo-interstitial nephritis, not specified as acute or chronic: Secondary | ICD-10-CM | POA: Diagnosis not present

## 2016-08-16 DIAGNOSIS — R05 Cough: Secondary | ICD-10-CM | POA: Diagnosis not present

## 2016-08-16 DIAGNOSIS — R059 Cough, unspecified: Secondary | ICD-10-CM

## 2016-08-16 MED ORDER — PREDNISONE 50 MG PO TABS: ORAL_TABLET | ORAL | 0 refills | Status: DC

## 2016-08-16 MED ORDER — ESOMEPRAZOLE MAGNESIUM 40 MG PO CPDR: 40.0000 mg | DELAYED_RELEASE_CAPSULE | Freq: Every day | ORAL | 2 refills | Status: DC

## 2016-08-16 NOTE — Progress Notes (Signed)
   Subjective:    Patient ID: Nicole Giles, female    DOB: 05-23-1945, 71 y.o.   MRN: [id number]  HPI  Pt is a 71 yo obese female who presents to the clinic for 1 week follow up after dx of pyelonephritis. She reports her urinary symptoms have resolved.she has finished her antibiotic.  She still has a dry cough, no appetitie, no energy, and aches all over. She has a PMH of smoking in her 43's off and on. Denies any fever, chills.   Review of Systems    see HPI.  Objective:   Physical Exam  Constitutional: She is oriented to person, place, and time. She appears well-developed and well-nourished.  Obese.   HENT:  Head: Normocephalic and atraumatic.  Cardiovascular: Normal rate, regular rhythm and normal heart sounds.   Pulmonary/Chest: Effort normal and breath sounds normal. She has no wheezes.  No CVA tenderness.   Abdominal: Soft. Bowel sounds are normal.  Mild tenderness over epigastric area. No guarding or rebound.   Neurological: She is alert and oriented to person, place, and time.  Psychiatric: She has a normal mood and affect. Her behavior is normal.          Assessment & Plan:  Marland KitchenMarland KitchenDiagnoses and all orders for this visit:  Cough -     predniSONE (DELTASONE) 50 MG tablet; Take one tablet for 5 days. -     esomeprazole (NEXIUM) 40 MG capsule; Take 1 capsule (40 mg total) by mouth daily at 12 noon. -     H. pylori breath test  Myalgia -     B12 -     Vitamin D 1,25 dihydroxy  No energy -     B12 -     Vitamin D 1,25 dihydroxy  Pyelonephritis  pyelonephritis-resolved.   For ongoing cough will evaluate with labs for energy, h.pylori before starting PPI's, and course of prednisone for inflammation. If cough persist when get CXR and spirometry. Discussed PND and allergies as a potential cause. Consider OTC antihistamine.

## 2016-08-16 NOTE — Patient Instructions (Signed)
Will call with breath test results.  Start nexium today.  Prednisone burst given.   Follow up in 1 month.

## 2016-08-17 LAB — VITAMIN B12: Vitamin B-12: 1749 pg/mL — ABNORMAL HIGH (ref 200–1100)

## 2016-08-17 LAB — H. PYLORI BREATH TEST: H. pylori Breath Test: NOT DETECTED

## 2016-08-18 ENCOUNTER — Other Ambulatory Visit: Payer: Self-pay | Admitting: *Deleted

## 2016-08-18 MED ORDER — HYDROCHLOROTHIAZIDE 25 MG PO TABS: 25.0000 mg | ORAL_TABLET | Freq: Every day | ORAL | 3 refills | Status: DC

## 2016-08-19 ENCOUNTER — Encounter: Payer: Self-pay | Admitting: Physician Assistant

## 2016-08-19 DIAGNOSIS — R5383 Other fatigue: Secondary | ICD-10-CM | POA: Insufficient documentation

## 2016-08-19 DIAGNOSIS — R059 Cough, unspecified: Secondary | ICD-10-CM | POA: Insufficient documentation

## 2016-08-19 DIAGNOSIS — R05 Cough: Secondary | ICD-10-CM | POA: Insufficient documentation

## 2016-08-19 DIAGNOSIS — N12 Tubulo-interstitial nephritis, not specified as acute or chronic: Secondary | ICD-10-CM | POA: Insufficient documentation

## 2016-08-19 DIAGNOSIS — M791 Myalgia, unspecified site: Secondary | ICD-10-CM | POA: Insufficient documentation

## 2016-08-20 LAB — VITAMIN D 1,25 DIHYDROXY
Vitamin D 1, 25 (OH)2 Total: 35 pg/mL (ref 18–72)
Vitamin D3 1, 25 (OH)2: 35 pg/mL

## 2016-09-13 ENCOUNTER — Ambulatory Visit: Payer: Medicare HMO | Admitting: Physician Assistant

## 2016-09-15 ENCOUNTER — Ambulatory Visit: Payer: Medicare HMO | Admitting: Physician Assistant

## 2016-09-16 ENCOUNTER — Ambulatory Visit (INDEPENDENT_AMBULATORY_CARE_PROVIDER_SITE_OTHER): Payer: Medicare HMO

## 2016-09-16 ENCOUNTER — Encounter: Payer: Self-pay | Admitting: Physician Assistant

## 2016-09-16 ENCOUNTER — Ambulatory Visit (INDEPENDENT_AMBULATORY_CARE_PROVIDER_SITE_OTHER): Payer: Medicare HMO | Admitting: Physician Assistant

## 2016-09-16 VITALS — BP 107/76 | HR 92 | Ht 60.0 in | Wt 216.0 lb

## 2016-09-16 DIAGNOSIS — M7582 Other shoulder lesions, left shoulder: Secondary | ICD-10-CM | POA: Diagnosis not present

## 2016-09-16 DIAGNOSIS — R7982 Elevated C-reactive protein (CRP): Secondary | ICD-10-CM

## 2016-09-16 DIAGNOSIS — F5101 Primary insomnia: Secondary | ICD-10-CM | POA: Diagnosis not present

## 2016-09-16 DIAGNOSIS — M25512 Pain in left shoulder: Secondary | ICD-10-CM

## 2016-09-16 DIAGNOSIS — M19012 Primary osteoarthritis, left shoulder: Secondary | ICD-10-CM | POA: Diagnosis not present

## 2016-09-16 DIAGNOSIS — R5383 Other fatigue: Secondary | ICD-10-CM

## 2016-09-16 LAB — TSH: TSH: 3.34 m[IU]/L

## 2016-09-16 MED ORDER — TRAZODONE HCL 50 MG PO TABS: 25.0000 mg | ORAL_TABLET | Freq: Every evening | ORAL | 2 refills | Status: DC | PRN

## 2016-09-16 MED ORDER — DICLOFENAC SODIUM 75 MG PO TBEC: 75.0000 mg | DELAYED_RELEASE_TABLET | Freq: Two times a day (BID) | ORAL | 1 refills | Status: DC

## 2016-09-16 NOTE — Progress Notes (Signed)
   Subjective:    Patient ID: ALMEDA EZRA, female    DOB: January 21, 1946, 71 y.o.   MRN: [id number]  HPI  Pt is a 71 yo female who presents to the clinic with left shoulder pain. Pain started one month ago. No injury. Pain is more of lateral shoulder under deltoid and into deltoid. No new medications. Not painful to touch. Pain is with ROM and external ROM. She has not tried anything to make better. Denies any neck pain. Denies any numbness and tingling.   Continues to try to lose weight. She has tried medications with no benefit or the cost was too high. She wants to know other options.   She continues to have problems with sleep. She cannot get to sleep. She has not tried any medications. She is very tired. Her b12, vitamin D checked and within normal range.     Review of Systems See HPI.     Objective:   Physical Exam  Constitutional: She is oriented to person, place, and time. She appears well-developed and well-nourished.  Morbid obesity.   HENT:  Head: Normocephalic and atraumatic.  Cardiovascular: Normal rate, regular rhythm and normal heart sounds.   Pulmonary/Chest: Effort normal and breath sounds normal.  Musculoskeletal:  Left shoulder: No bony tenderness or tenderness over anterior shoulder.  ROM is limited with external rotation.  Hand grip 5/5.  Strength of upper extremities 5/5.  Positive hawkins.  Negative empty can.   Neurological: She is alert and oriented to person, place, and time.  Psychiatric: She has a normal mood and affect. Her behavior is normal.          Assessment & Plan:  Marland KitchenMarland KitchenDiagnoses and all orders for this visit:  Acute pain of left shoulder -     DG Shoulder Left; Future -     diclofenac (VOLTAREN) 75 MG EC tablet; Take 1 tablet (75 mg total) by mouth 2 (two) times daily. -     DG Shoulder Left  Fatigue, unspecified type -     TSH  Elevated C-reactive protein (CRP)  Primary insomnia -     traZODone (DESYREL) 50 MG tablet; Take 0.5-1  tablets (25-50 mg total) by mouth at bedtime as needed for sleep.  Morbid obesity ([employer]) -     Amb ref to Medical Nutrition Therapy-MNT  unclear etiology. Will get xray. Almost seems like could be some inflammation over deltoid.and rotator cuff muscle strains. Given exercises to work on rotator cuff muscles. Start diclofenac. Discussed increased GI bleed risk. Let me know if having any GI issues. Encourage ice. May need formal PT or potential MRI. Follow up in 1 month.   Trazodone to try for sleep. Follow up in 1 month.   Working on weight loss. Made nutritionist referral.

## 2016-09-16 NOTE — Patient Instructions (Signed)
Tumeric 500mg  twice a day.    Shoulder Impingement Syndrome Rehab Ask your health care provider which exercises are safe for you. Do exercises exactly as told by your health care provider and adjust them as directed. It is normal to feel mild stretching, pulling, tightness, or discomfort as you do these exercises, but you should stop right away if you feel sudden pain or your pain gets worse.Do not begin these exercises until told by your health care provider. Stretching and range of motion exercise This exercise warms up your muscles and joints and improves the movement and flexibility of your shoulder. This exercise also helps to relieve pain and stiffness. Exercise A: Passive horizontal adduction   1. Sit or stand and pull your left / right elbow across your chest, toward your other shoulder. Stop when you feel a gentle stretch in the back of your shoulder and upper arm.  Keep your arm at shoulder height.  Keep your arm as close to your body as you comfortably can. 2. Hold for __________ seconds. 3. Slowly return to the starting position. Repeat __________ times. Complete this exercise __________ times a day. Strengthening exercises These exercises build strength and endurance in your shoulder. Endurance is the ability to use your muscles for a long time, even after they get tired. Exercise B: External rotation, isometric  1. Stand or sit in a doorway, facing the door frame. 2. Bend your left / right elbow and place the back of your wrist against the door frame. Only your wrist should be touching the frame. Keep your upper arm at your side. 3. Gently press your wrist against the door frame, as if you are trying to push your arm away from your abdomen.  Avoid shrugging your shoulder while you press your hand against the door frame. Keep your shoulder blade tucked down toward the middle of your back. 4. Hold for __________ seconds. 5. Slowly release the tension, and relax your muscles  completely before you do the exercise again. Repeat __________ times. Complete this exercise __________ times a day. Exercise C: Internal rotation, isometric   1. Stand or sit in a doorway, facing the door frame. 2. Bend your left / right elbow and place the inside of your wrist against the door frame. Only your wrist should be touching the frame. Keep your upper arm at your side. 3. Gently press your wrist against the door frame, as if you are trying to push your arm toward your abdomen.  Avoid shrugging your shoulder while you press your hand against the door frame. Keep your shoulder blade tucked down toward the middle of your back. 4. Hold for __________ seconds. 5. Slowly release the tension, and relax your muscles completely before you do the exercise again. Repeat __________ times. Complete this exercise __________ times a day. Exercise D: Scapular protraction, supine   1. Lie on your back on a firm surface. Hold a __________ weight in your left / right hand. 2. Raise your left / right arm straight into the air so your hand is directly above your shoulder joint. 3. Push the weight into the air so your shoulder lifts off of the surface that you are lying on. Do not move your head, neck, or back. 4. Hold for __________ seconds. 5. Slowly return to the starting position. Let your muscles relax completely before you repeat this exercise. Repeat __________ times. Complete this exercise __________ times a day. Exercise E: Scapular retraction   1. Sit in a stable  chair without armrests, or stand. 2. Secure an exercise band to a stable object in front of you so the band is at shoulder height. 3. Hold one end of the exercise band in each hand. Your palms should face down. 4. Squeeze your shoulder blades together and move your elbows slightly behind you. Do not shrug your shoulders while you do this. 5. Hold for __________ seconds. 6. Slowly return to the starting position. Repeat __________  times. Complete this exercise __________ times a day. Exercise F: Shoulder extension   1. Sit in a stable chair without armrests, or stand. 2. Secure an exercise band to a stable object in front of you where the band is above shoulder height. 3. Hold one end of the exercise band in each hand. 4. Straighten your elbows and lift your hands up to shoulder height. 5. Squeeze your shoulder blades together and pull your hands down to the sides of your thighs. Stop when your hands are straight down by your sides. Do not let your hands go behind your body. 6. Hold for __________ seconds. 7. Slowly return to the starting position. Repeat __________ times. Complete this exercise __________ times a day. This information is not intended to replace advice given to you by your health care provider. Make sure you discuss any questions you have with your health care provider. Document Released: 04/26/2005 Document Revised: 01/01/2016 Document Reviewed: 03/29/2015 Elsevier Interactive Patient Education  2017 [employer].

## 2016-09-17 ENCOUNTER — Encounter: Payer: Self-pay | Admitting: Physician Assistant

## 2016-09-17 DIAGNOSIS — M19012 Primary osteoarthritis, left shoulder: Secondary | ICD-10-CM | POA: Insufficient documentation

## 2016-09-19 DIAGNOSIS — R7982 Elevated C-reactive protein (CRP): Secondary | ICD-10-CM | POA: Insufficient documentation

## 2016-09-19 DIAGNOSIS — R5383 Other fatigue: Secondary | ICD-10-CM | POA: Insufficient documentation

## 2016-09-19 DIAGNOSIS — R531 Weakness: Secondary | ICD-10-CM | POA: Insufficient documentation

## 2016-09-19 DIAGNOSIS — M25512 Pain in left shoulder: Secondary | ICD-10-CM | POA: Insufficient documentation

## 2017-01-07 ENCOUNTER — Other Ambulatory Visit: Payer: Self-pay | Admitting: Physician Assistant

## 2017-01-07 DIAGNOSIS — M25512 Pain in left shoulder: Secondary | ICD-10-CM

## 2017-01-11 ENCOUNTER — Other Ambulatory Visit: Payer: Self-pay | Admitting: Physician Assistant

## 2017-01-11 DIAGNOSIS — M25512 Pain in left shoulder: Secondary | ICD-10-CM

## 2017-01-14 ENCOUNTER — Ambulatory Visit (INDEPENDENT_AMBULATORY_CARE_PROVIDER_SITE_OTHER): Payer: Medicare HMO | Admitting: Physician Assistant

## 2017-01-14 ENCOUNTER — Encounter: Payer: Self-pay | Admitting: Physician Assistant

## 2017-01-14 ENCOUNTER — Ambulatory Visit (HOSPITAL_BASED_OUTPATIENT_CLINIC_OR_DEPARTMENT_OTHER)
Admission: RE | Admit: 2017-01-14 | Discharge: 2017-01-14 | Disposition: A | Discharge status: Home / Self Care | Payer: Medicare HMO | Source: Ambulatory Visit | Attending: Physician Assistant | Admitting: Physician Assistant

## 2017-01-14 VITALS — BP 133/51 | HR 69 | Wt 217.0 lb

## 2017-01-14 DIAGNOSIS — M79662 Pain in left lower leg: Secondary | ICD-10-CM

## 2017-01-14 DIAGNOSIS — R600 Localized edema: Secondary | ICD-10-CM | POA: Diagnosis not present

## 2017-01-14 NOTE — Progress Notes (Signed)
No DVT. Continue with treatment plan discussed in office.

## 2017-01-14 NOTE — Progress Notes (Signed)
Subjective:    Patient ID: Nicole Giles, female    DOB: 1945-07-17, 71 y.o.   MRN: [id number]  HPI  Pt is a 71 yo morbidly obese  female who presents to the clinic with 3 to 4 weeks of left lateral calf pain. Pain occurs if she walks for a while, sits for a while or stands for a while. Describes as a ache. She continues to take diclofenac bid with little help. Not tried anything else. Denies any known injury. Denies any SOB or cough.   .. Active Ambulatory Problems    Diagnosis Date Noted  . HYPERLIPIDEMIA 08/28/2007  . Morbid obesity ([place]) 11/17/2006  . HEADACHE, TENSION 10/12/2006  . OBSTRUCTIVE SLEEP APNEA 09/21/2007  . VERTIGO, BENIGN PAROXYSMAL POSITION 02/15/2007  . 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 INFLUENZA DUE TO ID NOVEL H1N1 INFLUENZA VIRUS 03/20/2008  . GERD 08/22/2008  . FATTY LIVER DISEASE 09/04/2008  . UNSPECIFIED DISORDER OF KIDNEY AND URETER 09/02/2008  . UTI 11/29/2008  . ARTHRITIS, KNEES, BILATERAL 06/01/2007  . CERVICALGIA 06/13/2008  . Pain in limb 11/13/2007  . DIZZINESS 08/22/2008  . FATIGUE 12/08/2006  . DEPENDENT EDEMA, LEGS 08/25/2007  . DYSPNEA 08/22/2008  . CHEST PAIN 06/30/2007  . FREQUENCY, URINARY 09/09/2008  . TRANSAMINASES, SERUM, ELEVATED 08/27/2008  . INJURY OTHER AND UNSPECIFIED FINGER 10/04/2008  . Hyperlipemia 12/17/2013  . Arthralgia 12/17/2013  . Body aches 12/17/2013  . Elevated fasting glucose 01/16/2014  . Pre-diabetes 01/23/2014  . Primary osteoarthritis of right knee 08/15/2014  . Peripheral edema 12/31/2015  . Elevated blood pressure 12/31/2015  . Polymyalgia ([place]) 08/11/2016  . No energy 08/19/2016  . Myalgia 08/19/2016  . Cough 08/19/2016  . Pyelonephritis 08/19/2016  . 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 Fatigue 09/19/2016  . Elevated C-reactive protein (CRP) 09/19/2016  . Acute pain of left shoulder 09/19/2016  . Pain of left calf 01/16/2017   Resolved Ambulatory Problems    Diagnosis  Date Noted  . No Resolved Ambulatory Problems   Past Medical History:  Diagnosis Date  . H/O blood clots       Review of Systems See HPI.     Objective:   Physical Exam  Constitutional: She is oriented to person, place, and time. She appears well-developed and well-nourished.  HENT:  Head: Normocephalic and atraumatic.  Cardiovascular: Normal rate, regular rhythm and normal heart sounds.   Pulmonary/Chest: Effort normal and breath sounds normal. She has no wheezes.  Musculoskeletal:  Left calf:  No redness, warmth, bruising.  Negative homans sign.  Tenderness to palpation more lateral than medial.  Good pedal pulses.  Good color in bilateral feet.   Neurological: She is alert and oriented to person, place, and time.  Psychiatric: She has a normal mood and affect. Her behavior is normal.          Assessment & Plan:  Marland KitchenMarland KitchenLaparis was seen today for right calf.  Diagnoses and all orders for this visit:  Pain of left calf -     US Venous Img Lower Unilateral Left; Future    Depression screen [facility] 2/9 01/14/2017 09/16/2016  Decreased Interest 0 0  Down, Depressed, Hopeless 0 0  PHQ - 2 Score 0 0  Altered sleeping - 3  Tired, decreased energy - 3  Change in appetite - 3  Feeling bad or failure about yourself  - 0  Trouble concentrating - 1  Moving slowly or fidgety/restless - 0  Suicidal thoughts - 0  PHQ-9  Score - 10   No evidence of DVT on u/s.   Discussed likely gastrocnemius strain.  Continue diclofenac. Discussed rest, ice, compression, elevation. Consider biofreeze or icy hot.  Exercises given to start to work out calf muscles.  Good pulses and good color in feet without relief of pain when not walking do not feel like this could represent PVD.

## 2017-01-14 NOTE — Patient Instructions (Signed)
Icy hot/biofreeze Compression/ace bandage with elevation.  Continue voltaren.  Consider magneisum-cramping  If not better or worse let me know and will get xray back.   Medial Head Gastrocnemius Tear Rehab Ask your health care provider which exercises are safe for you. Do exercises exactly as told by your health care provider and adjust them as directed. It is normal to feel mild stretching, pulling, tightness, or discomfort as you do these exercises, but you should stop right away if you feel sudden pain or your pain gets worse.Do not begin these exercises until told by your health care provider. Stretching and range of motion exercises These exercises warm up your muscles and joints and improve the movement and flexibility of your lower leg. These exercises also help to relieve pain and stiffness. Exercise A: Gastrocnemius stretch  1. Sit with your left / right leg extended. 2. Loop a belt or towel around the ball of your left / right foot. The ball of your foot is on the walking surface, right under your toes. 3. Hold both ends of the belt or towel. 4. Keep your left / right ankle and foot relaxed and keep your knee straight while you use the belt or towel to pull your foot and ankle toward you. Stop at the first point of resistance. 5. Hold this position for __________ seconds. Repeat __________ times. Complete this exercise __________ times a day. Exercise B: Ankle alphabet  1. Sit with your left / right leg supported at the lower leg. ? Do not rest your foot on anything. ? Make sure your foot has room to move freely. 2. Think of your left / right foot as a paintbrush, and move your foot to trace each letter of the alphabet in the air. Keep your hip and knee still while you trace. 3. Trace every letter from A to Z. Repeat __________ times. Complete this exercise __________ times a day. Strengthening exercises These exercises build strength and endurance in your lower leg. Endurance  is the ability to use your muscles for a long time, even after they get tired. Exercise C: Plantar flexors with band  1. Sit with your left / right leg extended. 2. Loop a rubber exercise band or tube around the ball of your left / right foot. The ball of your foot is on the walking surface, right under your toes. 3. While holding both ends of the band or tube, slowly point your toes downward, pushing them away from you. 4. Hold this position for __________ seconds. 5. Slowly return your foot to the starting position. Repeat __________ times. Complete this exercise __________ times a day. Exercise D: Plantar flexors, standing  1. Stand with your feet shoulder-width apart. 2. Place your hands on a wall or table to steady yourself as needed, but try not to use it very much for support. 3. Rise up on your toes. 4. If this exercise is too easy, try these options: ? Shift your weight toward your left / right leg until you feel challenged. ? If told by your health care provider, stand on your left / right foot only. 5. Hold this position for __________ seconds. Repeat __________ times. Complete this exercise __________ times a day. Exercise E: Plantar flexors, eccentric  1. Stand on the balls of your feet on the edge of a step. The ball of your foot is on the walking surface, right under your toes. 2. Place your hands on a wall or railing for balance as needed, but  try not to lean on it for support. 3. Rise up on your toes, using both legs to help. 4. Slowly shift all of your weight to your left / right foot and lift your other foot off the step. 5. Slowly lower your left / right heel so it drops below the level of the step. You will feel a slight stretch in your left / right calf. 6. Put your other foot back onto the step. Repeat __________ times. Complete this exercise __________ times a day. This information is not intended to replace advice given to you by your health care provider. Make  sure you discuss any questions you have with your health care provider. Document Released: 04/26/2005 Document Revised: 01/01/2016 Document Reviewed: 04/08/2015 Elsevier Interactive Patient Education  [employer].

## 2017-01-16 ENCOUNTER — Encounter: Payer: Self-pay | Admitting: Physician Assistant

## 2017-01-16 DIAGNOSIS — M79662 Pain in left lower leg: Secondary | ICD-10-CM | POA: Insufficient documentation

## 2017-02-04 ENCOUNTER — Ambulatory Visit (INDEPENDENT_AMBULATORY_CARE_PROVIDER_SITE_OTHER): Payer: Medicare HMO | Admitting: Physician Assistant

## 2017-02-04 ENCOUNTER — Encounter: Payer: Self-pay | Admitting: Physician Assistant

## 2017-02-04 VITALS — BP 133/70 | HR 72 | Temp 98.6°F | Wt 216.0 lb

## 2017-02-04 DIAGNOSIS — R300 Dysuria: Secondary | ICD-10-CM

## 2017-02-04 DIAGNOSIS — R252 Cramp and spasm: Secondary | ICD-10-CM

## 2017-02-04 DIAGNOSIS — N3000 Acute cystitis without hematuria: Secondary | ICD-10-CM | POA: Diagnosis not present

## 2017-02-04 DIAGNOSIS — R197 Diarrhea, unspecified: Secondary | ICD-10-CM

## 2017-02-04 LAB — CBC WITH DIFFERENTIAL/PLATELET
BASOS PCT: 0.4 %
Basophils Absolute: 28 cells/uL (ref 0–200)
EOS ABS: 524 {cells}/uL — AB (ref 15–500)
Eosinophils Relative: 7.6 %
HCT: 39.4 % (ref 35.0–45.0)
Hemoglobin: 13.6 g/dL (ref 11.7–15.5)
LYMPHS ABS: 2657 {cells}/uL (ref 850–3900)
MCH: 33.7 pg — AB (ref 27.0–33.0)
MCHC: 34.5 g/dL (ref 32.0–36.0)
MCV: 97.5 fL (ref 80.0–100.0)
MPV: 10.1 fL (ref 7.5–12.5)
Monocytes Relative: 7.6 %
NEUTROS ABS: 3167 {cells}/uL (ref 1500–7800)
Neutrophils Relative %: 45.9 %
Platelets: 287 10*3/uL (ref 140–400)
RBC: 4.04 10*6/uL (ref 3.80–5.10)
RDW: 11.8 % (ref 11.0–15.0)
Total Lymphocyte: 38.5 %
WBC: 6.9 10*3/uL (ref 3.8–10.8)
WBCMIX: 524 {cells}/uL (ref 200–950)

## 2017-02-04 LAB — POCT URINALYSIS DIPSTICK
Bilirubin, UA: NEGATIVE
Blood, UA: NEGATIVE
Glucose, UA: NEGATIVE
KETONES UA: NEGATIVE
NITRITE UA: POSITIVE
PROTEIN UA: NEGATIVE
Spec Grav, UA: 1.015 (ref 1.010–1.025)
Urobilinogen, UA: 0.2 E.U./dL
pH, UA: 5 (ref 5.0–8.0)

## 2017-02-04 LAB — BASIC METABOLIC PANEL WITH GFR
BUN: 8 mg/dL (ref 7–25)
CO2: 25 mmol/L (ref 20–32)
Calcium: 8.7 mg/dL (ref 8.6–10.4)
Chloride: 105 mmol/L (ref 98–110)
Creat: 0.69 mg/dL (ref 0.50–0.99)
GFR, Est African American: 103 mL/min/{1.73_m2} (ref >=60)
GFR, Est Non African American: 89 mL/min/{1.73_m2} (ref >=60)
GLUCOSE: 129 mg/dL — AB (ref 65–99)
Potassium: 3.9 mmol/L (ref 3.5–5.3)
Sodium: 139 mmol/L (ref 135–146)

## 2017-02-04 MED ORDER — CIPROFLOXACIN HCL 500 MG PO TABS: 500.0000 mg | ORAL_TABLET | Freq: Two times a day (BID) | ORAL | 0 refills | Status: DC

## 2017-02-04 NOTE — Progress Notes (Signed)
Cipro sent for 3 days due to non-complicated UTI.

## 2017-02-04 NOTE — Progress Notes (Signed)
   Subjective:    Patient ID: SECRET KRISTENSEN, female    DOB: 1945-07-21, 71 y.o.   MRN: [id number]  HPI    Review of Systems     Objective:   Physical Exam        Assessment & Plan:

## 2017-02-04 NOTE — Patient Instructions (Addendum)
Use immodium for diarrhea.  Stop HCTZ and magnesium. Will see if causing diarrhea and cramps.    Diarrhea, Adult Diarrhea is frequent loose and watery bowel movements. Diarrhea can make you feel weak and cause you to become dehydrated. Dehydration can make you tired and thirsty, cause you to have a dry mouth, and decrease how often you urinate. Diarrhea typically lasts 2-3 days. However, it can last longer if it is a sign of something more serious. It is important to treat your diarrhea as told by your health care provider. Follow these instructions at home: Eating and drinking  Follow these recommendations as told by your health care provider:  Take an oral rehydration solution (ORS). This is a drink that is sold at pharmacies and retail stores.  Drink clear fluids, such as water, ice chips, diluted fruit juice, and low-calorie sports drinks.  Eat bland, easy-to-digest foods in small amounts as you are able. These foods include bananas, applesauce, rice, lean meats, toast, and crackers.  Avoid drinking fluids that contain a lot of sugar or caffeine, such as energy drinks, sports drinks, and soda.  Avoid alcohol.  Avoid spicy or fatty foods.  General instructions  Drink enough fluid to keep your urine clear or pale yellow.  Wash your hands often. If soap and water are not available, use hand sanitizer.  Make sure that all people in your household wash their hands well and often.  Take over-the-counter and prescription medicines only as told by your health care provider.  Rest at home while you recover.  Watch your condition for any changes.  Take a warm bath to relieve any burning or pain from frequent diarrhea episodes.  Keep all follow-up visits as told by your health care provider. This is important. Contact a health care provider if:  You have a fever.  Your diarrhea gets worse.  You have new symptoms.  You cannot keep fluids down.  You feel light-headed or  dizzy.  You have a headache  You have muscle cramps. Get help right away if:  You have chest pain.  You feel extremely weak or you faint.  You have bloody or black stools or stools that look like tar.  You have severe pain, cramping, or bloating in your abdomen.  You have trouble breathing or you are breathing very quickly.  Your heart is beating very quickly.  Your skin feels cold and clammy.  You feel confused.  You have signs of dehydration, such as: ? Dark urine, very little urine, or no urine. ? Cracked lips. ? Dry mouth. ? Sunken eyes. ? Sleepiness. ? Weakness. This information is not intended to replace advice given to you by your health care provider. Make sure you discuss any questions you have with your health care provider. Document Released: 04/16/2002 Document Revised: 09/04/2015 Document Reviewed: 12/31/2014 Elsevier Interactive Patient Education  2017 [place] Diet A bland diet consists of foods that do not have a lot of fat or fiber. Foods without fat or fiber are easier for the body to digest. They are also less likely to irritate your mouth, throat, stomach, and other parts of your gastrointestinal tract. A bland diet is sometimes called a BRAT diet. What is my plan? Your health care provider or dietitian may recommend specific changes to your diet to prevent and treat your symptoms, such as:  Eating small meals often.  Cooking food until it is soft enough to chew easily.  Chewing your food well.  Drinking  fluids slowly.  Not eating foods that are very spicy, sour, or fatty.  Not eating citrus fruits, such as oranges and grapefruit.  What do I need to know about this diet?  Eat a variety of foods from the bland diet food list.  Do not follow a bland diet longer than you have to.  Ask your health care provider whether you should take vitamins. What foods can I eat? Grains  Hot cereals, such as cream of wheat. Bread, crackers,  or tortillas made from refined white flour. Rice. Vegetables Canned or cooked vegetables. Mashed or boiled potatoes. Fruits Bananas. Applesauce. Other types of cooked or canned fruit with the skin and seeds removed, such as canned peaches or pears. Meats and Other Protein Sources Scrambled eggs. Creamy peanut butter or other nut butters. Lean, well-cooked meats, such as chicken or fish. Tofu. Soups or broths. Dairy Low-fat dairy products, such as milk, cottage cheese, or yogurt. Beverages Water. Herbal tea. Apple juice. Sweets and Desserts Pudding. Custard. Fruit gelatin. Ice cream. Fats and Oils Mild salad dressings. Canola or olive oil. The items listed above may not be a complete list of allowed foods or beverages. Contact your dietitian for more options. What foods are not recommended? Foods and ingredients that are often not recommended include:  Spicy foods, such as hot sauce or salsa.  Fried foods.  Sour foods, such as pickled or fermented foods.  Raw vegetables or fruits, especially citrus or berries.  Caffeinated drinks.  Alcohol.  Strongly flavored seasonings or condiments.  The items listed above may not be a complete list of foods and beverages that are not allowed. Contact your dietitian for more information. This information is not intended to replace advice given to you by your health care provider. Make sure you discuss any questions you have with your health care provider. Document Released: 08/18/2015 Document Revised: 10/02/2015 Document Reviewed: 05/08/2014 Elsevier Interactive Patient Education  2018 [employer].

## 2017-02-04 NOTE — Progress Notes (Addendum)
Subjective:    Patient ID: Nicole Giles, female    DOB: 10-Jul-1945, 71 y.o.   MRN: [id number]  HPI The patient is a 71 year old female who presents to the clinic today with a chief complaint of diarrhea. The diarrhea began Monday of this week. She believed that her diarrhea occurred because she ate mashed potatoes with whole milk on Sunday, however the diarrhea has continued since then. She also reports a similar episode of diarrhea one week ago. She reports abdominal cramping before the diarrhea occurs. She states she is going to the bathroom every thirty minutes for last 2 days. She tried eating toast with tomatoes last night, but within an hour she experienced diarrhea again. Her hemorrhoids have been bleeding as a result of the diarrhea. She recently began taking magnesium three weeks ago. She states she stopped all of her medications and the magnesium on Sunday when the diarrhea began. She has tried [employer], with some relief. She states that drinking warm Coca Cola has also offered relief. She denies sick contacts. Denies fever or chills.  She does report burning with urination. She denies blood in the urine or increased frequency.   She also reports cramping in her feet and legs, particularly at night. She believed that taking magnesium would help with the cramps.   .. Active Ambulatory Problems    Diagnosis Date Noted  . HYPERLIPIDEMIA 08/28/2007  . Morbid obesity ([place]) 11/17/2006  . HEADACHE, TENSION 10/12/2006  . OBSTRUCTIVE SLEEP APNEA 09/21/2007  . VERTIGO, BENIGN PAROXYSMAL POSITION 02/15/2007  . 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 INFLUENZA DUE TO ID NOVEL H1N1 INFLUENZA VIRUS 03/20/2008  . GERD 08/22/2008  . FATTY LIVER DISEASE 09/04/2008  . UNSPECIFIED DISORDER OF KIDNEY AND URETER 09/02/2008  . UTI 11/29/2008  . ARTHRITIS, KNEES, BILATERAL 06/01/2007  . CERVICALGIA 06/13/2008  . Pain in limb 11/13/2007  . DIZZINESS 08/22/2008  . FATIGUE 12/08/2006  . DEPENDENT  EDEMA, LEGS 08/25/2007  . DYSPNEA 08/22/2008  . CHEST PAIN 06/30/2007  . FREQUENCY, URINARY 09/09/2008  . TRANSAMINASES, SERUM, ELEVATED 08/27/2008  . INJURY OTHER AND UNSPECIFIED FINGER 10/04/2008  . Hyperlipemia 12/17/2013  . Arthralgia 12/17/2013  . Body aches 12/17/2013  . Elevated fasting glucose 01/16/2014  . Pre-diabetes 01/23/2014  . Primary osteoarthritis of right knee 08/15/2014  . Peripheral edema 12/31/2015  . Elevated blood pressure 12/31/2015  . Polymyalgia ([place]) 08/11/2016  . No energy 08/19/2016  . Myalgia 08/19/2016  . Cough 08/19/2016  . Pyelonephritis 08/19/2016  . 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 Fatigue 09/19/2016  . Elevated C-reactive protein (CRP) 09/19/2016  . Acute pain of left shoulder 09/19/2016  . Pain of left calf 01/16/2017   Resolved Ambulatory Problems    Diagnosis Date Noted  . No Resolved Ambulatory Problems   Past Medical History:  Diagnosis Date  . H/O blood clots      Review of Systems  All other systems reviewed and are negative.      Objective:   Physical Exam  Constitutional: She is oriented to person, place, and time. She appears well-developed and well-nourished.  HENT:  Head: Normocephalic and atraumatic.  Right Ear: External ear normal.  Left Ear: External ear normal.  Eyes: Conjunctivae are normal. Right eye exhibits no discharge. Left eye exhibits no discharge.  Cardiovascular: Normal rate, regular rhythm and normal heart sounds.   Pulmonary/Chest: Effort normal and breath sounds normal. She has no wheezes.  No CVA tenderness.   Abdominal: Soft.  Bowel sounds are normal. There is no tenderness. There is no rebound and no guarding.  No CVA tenderness. No radiating pain to the back.   Lymphadenopathy:    She has no cervical adenopathy.  Neurological: She is alert and oriented to person, place, and time.  Psychiatric: She has a normal mood and affect. Her behavior is normal.       Assessment & Plan:   Marland KitchenMarland KitchenFaren was seen today for diarrhea since monday.  Diagnoses and all orders for this visit:  Diarrhea, unspecified type -     BASIC METABOLIC PANEL WITH GFR -     Stool culture -     Clostridium difficile culture-fecal -     CBC with Differential/Platelet  Muscle cramps -     BASIC METABOLIC PANEL WITH GFR -     Stool culture -     Clostridium difficile culture-fecal -     CBC with Differential/Platelet  Dysuria   Unclear etiology viral gastroenteritis vs food sensitivity vs medication side effect. We will get a stool culture to rule out C. Difficile though patient denies any recent hospitalization or contact with person with C.diff. We will also get a CBC to look at WBC and BMP check potassium, sodium, and magnesium levels.  I suggest at this time stopping HCTZ and magnesium. Will consider adding back with diarrhea resolves.  Ok to take immodium to stop diarrhea.   .. Results for orders placed or performed in visit on 02/04/17  Urinalysis Dipstick  Result Value Ref Range   Color, UA yellow    Clarity, UA clear    Glucose, UA negative    Bilirubin, UA negative    Ketones, UA negative    Spec Grav, UA 1.015 1.010 - 1.025   Blood, UA negative    pH, UA 5.0 5.0 - 8.0   Protein, UA negative    Urobilinogen, UA 0.2 0.2 or 1.0 E.U./dL   Nitrite, UA positive    Leukocytes, UA Small (1+) (A) Negative    A prescription for Cipro was sent over to her pharmacy for her UTI. Will culture.

## 2017-02-07 LAB — URINE CULTURE
MICRO NUMBER: 81079119
SPECIMEN QUALITY: ADEQUATE

## 2017-02-07 NOTE — Progress Notes (Signed)
Call pt: E coli detected on urine. Should be sensitive to cipro. Urinary symptoms should improve. How is diarrhea?

## 2017-09-08 DIAGNOSIS — H527 Unspecified disorder of refraction: Secondary | ICD-10-CM | POA: Diagnosis not present

## 2017-11-21 ENCOUNTER — Other Ambulatory Visit: Payer: Self-pay

## 2017-11-21 ENCOUNTER — Emergency Department (INDEPENDENT_AMBULATORY_CARE_PROVIDER_SITE_OTHER): Payer: Medicare HMO

## 2017-11-21 ENCOUNTER — Emergency Department
Admission: EM | Admit: 2017-11-21 | Discharge: 2017-11-21 | Disposition: A | Discharge status: Home / Self Care | Payer: Medicare HMO | Source: Home / Self Care | Attending: Family Medicine | Admitting: Family Medicine

## 2017-11-21 ENCOUNTER — Encounter: Payer: Self-pay | Admitting: *Deleted

## 2017-11-21 DIAGNOSIS — M1711 Unilateral primary osteoarthritis, right knee: Secondary | ICD-10-CM

## 2017-11-21 DIAGNOSIS — M25561 Pain in right knee: Secondary | ICD-10-CM | POA: Diagnosis not present

## 2017-11-21 DIAGNOSIS — S8391XA Sprain of unspecified site of right knee, initial encounter: Secondary | ICD-10-CM

## 2017-11-21 HISTORY — DX: Gastro-esophageal reflux disease without esophagitis: K21.9

## 2017-11-21 NOTE — Discharge Instructions (Addendum)
Apply ice pack for 20 to 30 minutes, 3 to 4 times daily  Continue until pain and swelling decrease.  May take Ibuprofen 200mg , 4 tabs every 8 hours with food.   May take Tylenol as needed for pain.  Wear brace daytime.

## 2017-11-21 NOTE — ED Provider Notes (Signed)
[name]    CSN: [id number] Arrival date & time: 11/21/17  1528     History   Chief Complaint Chief Complaint  Patient presents with  . Knee Pain    HPI Nicole Giles is a 72 y.o. female.   While arising from her couch four days ago, patient twisted her right knee resulting in persistent pain/swelling.  She has a distant past history of surgical repair of recurring patellar subluxation.   Her knee feels unstable, and she has achieved slight improvement with an elastic knee brace.  The history is provided by the patient.  Knee Pain  Location:  Knee Time since incident:  4 days Injury: yes   Mechanism of injury comment:  Twisted knee Knee location:  R knee Pain details:    Quality:  Aching   Radiates to:  Does not radiate   Severity:  Moderate   Onset quality:  Sudden   Duration:  4 days   Timing:  Constant   Progression:  Unchanged Chronicity:  New Prior injury to area:  Yes Relieved by:  Nothing Worsened by:  Bearing weight and activity Ineffective treatments:  NSAIDs and compression Associated symptoms: decreased ROM, stiffness and swelling   Associated symptoms: no back pain, no fatigue, no fever, no muscle weakness, no numbness and no tingling   Risk factors: obesity     Past Medical History:  Diagnosis Date  . GERD (gastroesophageal reflux disease)   . H/O blood clots    in left leg after left arm surgery in 05/2006 ----- off coumadin    Patient Active Problem List   Diagnosis Date Noted  . Pain of left calf 01/16/2017  . Fatigue 09/19/2016  . Elevated C-reactive protein (CRP) 09/19/2016  . Acute pain of left shoulder 09/19/2016  . 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 No energy 08/19/2016  . Myalgia 08/19/2016  . Cough 08/19/2016  . Pyelonephritis 08/19/2016  . Polymyalgia ([place]) 08/11/2016  . Peripheral edema 12/31/2015  . Elevated blood pressure 12/31/2015  . Primary osteoarthritis of right knee  08/15/2014  . Pre-diabetes 01/23/2014  . Elevated fasting glucose 01/16/2014  . Hyperlipemia 12/17/2013  . Arthralgia 12/17/2013  . Body aches 12/17/2013  . UTI 11/29/2008  . INJURY OTHER AND UNSPECIFIED FINGER 10/04/2008  . FREQUENCY, URINARY 09/09/2008  . FATTY LIVER DISEASE 09/04/2008  . UNSPECIFIED DISORDER OF KIDNEY AND URETER 09/02/2008  . TRANSAMINASES, SERUM, ELEVATED 08/27/2008  . GERD 08/22/2008  . DIZZINESS 08/22/2008  . DYSPNEA 08/22/2008  . CERVICALGIA 06/13/2008  . INFLUENZA DUE TO ID NOVEL H1N1 INFLUENZA VIRUS 03/20/2008  . Pain in limb 11/13/2007  . OBSTRUCTIVE SLEEP APNEA 09/21/2007  . HYPERLIPIDEMIA 08/28/2007  . DEPENDENT EDEMA, LEGS 08/25/2007  . CHEST PAIN 06/30/2007  . ARTHRITIS, KNEES, BILATERAL 06/01/2007  . VERTIGO, BENIGN PAROXYSMAL POSITION 02/15/2007  . FATIGUE 12/08/2006  . Morbid obesity ([place]) 11/17/2006  . HEADACHE, TENSION 10/12/2006  . PULMONARY EMBOLISM 10/12/2006    Past Surgical History:  Procedure Laterality Date  . CHOLECYSTECTOMY    . CYSTECTOMY    . left arm fracture    . NECK SURGERY     cyst removal  . right knee surgery      OB History   None      Home Medications    Prior to Admission medications   Medication Sig Start Date End Date Taking? Authorizing Provider  esomeprazole (NEXIUM) 40 MG capsule Take 1 capsule (40 mg total) by mouth daily at  12 noon. 08/16/16  Yes Breeback, Jade L, PA-C  ibuprofen (ADVIL,MOTRIN) 800 MG tablet Take 1 tablet (800 mg total) by mouth every 8 (eight) hours as needed. 08/19/14   Donella Stade, PA-C    Family History Family History  Problem Relation Age of Onset  . Prostate cancer Unknown   . Diabetes Unknown   . Diabetes Brother     Social History Social History   Tobacco Use  . Smoking status: Former Research scientist (life sciences)  . Smokeless tobacco: Current User  Substance Use Topics  . Alcohol use: No  . Drug use: No     Allergies   Codeine and Meperidine hcl   Review of  Systems Review of Systems  Constitutional: Negative for fatigue and fever.  Musculoskeletal: Positive for stiffness. Negative for back pain.  All other systems reviewed and are negative.    Physical Exam Triage Vital Signs ED Triage Vitals  Enc Vitals Group     BP 11/21/17 1615 126/74     Pulse Rate 11/21/17 1615 75     Resp 11/21/17 1615 18     Temp 11/21/17 1615 98.2 F (36.8 C)     Temp Source 11/21/17 1615 Oral     SpO2 11/21/17 1615 95 %     Weight 11/21/17 1616 219 lb (99.3 kg)     Height 11/21/17 1616 5' (1.524 m)     Head Circumference --      Peak Flow --      Pain Score 11/21/17 1615 4     Pain Loc --      Pain Edu? --      Excl. in [place]? --    No data found.  Updated Vital Signs BP 126/74 (BP Location: Right Arm)   Pulse 75   Temp 98.2 F (36.8 C) (Oral)   Resp 18   Ht 5' (1.524 m)   Wt 219 lb (99.3 kg)   SpO2 95%   BMI 42.77 kg/m   Visual Acuity Right Eye Distance:   Left Eye Distance:   Bilateral Distance:    Right Eye Near:   Left Eye Near:    Bilateral Near:     Physical Exam  Constitutional: She appears well-developed and well-nourished. No distress.  HENT:  Head: Normocephalic.  Eyes: Pupils are equal, round, and reactive to light.  Neck: Normal range of motion.  Cardiovascular: Normal rate.  Pulmonary/Chest: Effort normal.  Musculoskeletal: She exhibits no edema.       Right knee: She exhibits decreased range of motion and swelling. She exhibits no ecchymosis, no deformity, no laceration, no erythema, normal alignment, no LCL laxity and normal patellar mobility. Tenderness found. Medial joint line, lateral joint line and patellar tendon tenderness noted.  Right knee:  No erythema or warmth.  Decreased range of motion. Knee stable, negative drawer test.  Tenderness over medial and lateral joint lines.  Unable to perform McMurray test.  Neurological: She is alert.  Skin: Skin is warm and dry.  Nursing note and vitals reviewed.    UC  Treatments / Results  Labs (all labs ordered are listed, but only abnormal results are displayed) Labs Reviewed - No data to display  EKG None  Radiology Dg Knee Complete 4 Views Right  Result Date: 11/21/2017 CLINICAL DATA:  Right knee pain for the past 4 days. EXAM: RIGHT KNEE - COMPLETE 4+ VIEW COMPARISON:  Right knee x-rays dated May 18, 2007. FINDINGS: No acute fracture or dislocation. Progressive medial compartment joint space  narrowing, now with bone-on-bone apposition. Mild genu varus deformity. Moderate patellofemoral compartment joint space narrowing, similar to prior study. No significant joint effusion. Osteopenia. Vascular calcifications. Soft tissues are unremarkable. IMPRESSION: 1. Progressive severe osteoarthritis of the medial compartment. Unchanged moderate patellofemoral compartment osteoarthritis. 2.  No acute osseous abnormality. Electronically Signed   By: Titus Dubin M.D.   On: 11/21/2017 17:38    Procedures Procedures (including critical care time)  Medications Ordered in UC Medications - No data to display  Initial Impression / Assessment and Plan / UC Course  I have reviewed the triage vital signs and the nursing notes.  Pertinent labs & imaging results that were available during my care of the patient were reviewed by me and considered in my medical decision making (see chart for details).    Dispensed knee brace. Followup with Dr. Aundria Mems or Dr. Lynne Leader ([facility]) as soon as possible for further evaluation.   Final Clinical Impressions(s) / UC Diagnoses   Final diagnoses:  Sprain of right knee, unspecified ligament, initial encounter  Osteoarthritis of right knee, unspecified osteoarthritis type     Discharge Instructions     Apply ice pack for 20 to 30 minutes, 3 to 4 times daily  Continue until pain and swelling decrease.  May take Ibuprofen 200mg , 4 tabs every 8 hours with food.   May take Tylenol as needed for  pain.  Wear brace daytime.    ED Prescriptions    None        Kandra Nicolas, MD 11/25/17 1353

## 2017-11-21 NOTE — ED Triage Notes (Signed)
Pt c/o RT knee pain x 4 days. Denies injury. Last dose Advil 1400 today.

## 2017-11-28 ENCOUNTER — Telehealth: Payer: Self-pay | Admitting: Physician Assistant

## 2017-11-28 ENCOUNTER — Encounter: Payer: Medicare HMO | Admitting: Family Medicine

## 2017-11-28 NOTE — Telephone Encounter (Signed)
Pt called at 8:36 to cancel her appt today.- she had to take care of her disabled son.

## 2017-11-30 ENCOUNTER — Ambulatory Visit (INDEPENDENT_AMBULATORY_CARE_PROVIDER_SITE_OTHER): Payer: Medicare HMO | Admitting: Family Medicine

## 2017-11-30 ENCOUNTER — Encounter: Payer: Self-pay | Admitting: Family Medicine

## 2017-11-30 VITALS — BP 141/79 | HR 70 | Wt 219.0 lb

## 2017-11-30 DIAGNOSIS — M1731 Unilateral post-traumatic osteoarthritis, right knee: Secondary | ICD-10-CM | POA: Diagnosis not present

## 2017-11-30 DIAGNOSIS — M25561 Pain in right knee: Secondary | ICD-10-CM | POA: Diagnosis not present

## 2017-11-30 MED ORDER — DICLOFENAC SODIUM 1 % TD GEL: 4.0000 g | Freq: Four times a day (QID) | TRANSDERMAL | 11 refills | Status: DC

## 2017-11-30 NOTE — Progress Notes (Signed)
Nicole Giles is a 72 y.o. female who presents to [facility] today for right knee pain.  Nicole Giles notes a recent exacerbation of her right knee pain.  She notes a pertinent history of significant surgery in her right knee 40 years ago due to patellar dislocation.  She is had some pain in her knee however recently she developed significant worsened pain.  She was walking and felt a grinding pop sensation in her right knee.  She was seen in urgent care where she was found to have knee swelling and pain.  X-ray showed significant medial compartment DJD and moderate retropatellar DJD.  No fractures are identified.  She was provided a knee brace which has not been helpful.  She used a different over-the-counter brace which has been helpful.  She has been using ibuprofen which does help.  She notes pain is worse with activity and better with rest.  She denies any recent injury.  No fevers or chills.  She notes the pain today is significantly improved but not quite back to baseline.  To her visit on July 15.    ROS:  As above  Exam:  BP (!) 141/79   Pulse 70   Wt 219 lb (99.3 kg)   BMI 42.77 kg/m  General: Well Developed, well nourished, and in no acute distress.  Neuro/Psych: Alert and oriented x3, extra-ocular muscles intact, able to move all 4 extremities, sensation grossly intact. Skin: Warm and dry, no rashes noted.  Respiratory: Not using accessory muscles, speaking in full sentences, trachea midline.  Cardiovascular: Pulses palpable, no extremity edema. Abdomen: Does not appear distended. MSK:  Right knee: Significant midline scar.  Moderate effusion. Range of motion 5-100 degrees with significant crepitations. Tender palpation medial joint line. Patient guards with ligamentous exam testing. Antalgic gait. Pulses capillary fill and sensation are intact distally.    Lab and Radiology Results EXAM: RIGHT KNEE - COMPLETE 4+  VIEW  COMPARISON:  Right knee x-rays dated May 18, 2007.  FINDINGS: No acute fracture or dislocation. Progressive medial compartment joint space narrowing, now with bone-on-bone apposition. Mild genu varus deformity. Moderate patellofemoral compartment joint space narrowing, similar to prior study. No significant joint effusion. Osteopenia. Vascular calcifications. Soft tissues are unremarkable.  IMPRESSION: 1. Progressive severe osteoarthritis of the medial compartment. Unchanged moderate patellofemoral compartment osteoarthritis. 2.  No acute osseous abnormality.   Electronically Signed   By: Titus Dubin M.D.   On: 11/21/2017 17:38  I personally (independently) visualized and performed the interpretation of the images attached in this note.     Assessment and Plan: 72 y.o. female with  Right knee pain very likely due to exacerbation of severe medial compartment DJD.  Patient is significantly improved today compared to her presentation urgent care on the 15th but not quite back to baseline.  Ultimately she will very likely require total knee replacement but would like to get her back to her normal baseline if possible.  Plan after discussion to avoid invasive treatment if possible.  Plan to use diclofenac gel topically as well as continued oral NSAIDs and Tylenol.  Continue over-the-counter bracing as needed.  Continue ice and discuss quad strength and weight loss.  If not improving next step would be an articular steroid injection trial.  Spent time discussing total knee replacement recovery and expectations.  Recheck in about a month.    No orders of the defined types were placed in this encounter.  Meds ordered this  encounter  Medications  . diclofenac sodium (VOLTAREN) 1 % GEL    Sig: Apply 4 g topically 4 (four) times daily. To affected joint.    Dispense:  100 g    Refill:  11    Knee DJD. Tried ibuprofen and aleve    Historical information moved to  improve visibility of documentation.  Past Medical History:  Diagnosis Date  . GERD (gastroesophageal reflux disease)   . H/O blood clots    in left leg after left arm surgery in 05/2006 ----- off coumadin  . Morbid obesity ([place]) 11/17/2006   Qualifier: Diagnosis of  By: Madilyn Fireman MD, Barnetta Chapel    . OBSTRUCTIVE SLEEP APNEA 09/21/2007   Qualifier: Diagnosis of  By: Madilyn Fireman MD, Barnetta Chapel    . Polymyalgia ([place]) 08/11/2016   Past Surgical History:  Procedure Laterality Date  . CHOLECYSTECTOMY    . CYSTECTOMY    . left arm fracture    . NECK SURGERY     cyst removal  . right knee surgery     Social History   Tobacco Use  . Smoking status: Former Research scientist (life sciences)  . Smokeless tobacco: Current User  Substance Use Topics  . Alcohol use: No   family history includes Diabetes in her brother and unknown relative; Prostate cancer in her unknown relative.  Medications: Current Outpatient Medications  Medication Sig Dispense Refill  . esomeprazole (NEXIUM) 40 MG capsule Take 1 capsule (40 mg total) by mouth daily at 12 noon. 30 capsule 2  . ibuprofen (ADVIL,MOTRIN) 800 MG tablet Take 1 tablet (800 mg total) by mouth every 8 (eight) hours as needed. 60 tablet 0  . diclofenac sodium (VOLTAREN) 1 % GEL Apply 4 g topically 4 (four) times daily. To affected joint. 100 g 11   No current facility-administered medications for this visit.    Allergies  Allergen Reactions  . Codeine   . Meperidine Hcl       Discussed warning signs or symptoms. Please see discharge instructions. Patient expresses understanding.

## 2017-11-30 NOTE — Patient Instructions (Addendum)
Thank you for coming in today. Use the diclofenac gel up to 4x daily.  Recheck with in a few weeks if not getting better.  Next step is injection.  Ice can help.  Eventually if not better total knee replacement will help.    Total Knee Replacement Total knee replacement is a procedure to replace the knee joint with an artificial (prosthetic) knee joint. The purpose of this surgery is to reduce knee pain and improve knee function. The prosthetic knee joint (prosthesis) may be made of metal, plastic or ceramic. It replaces parts of the thigh bone (femur), lower leg bone (tibia), and kneecap (patella) that are removed during the procedure. Tell a health care provider about:  Any allergies you have.  All medicines you are taking, including vitamins, herbs, eye drops, creams, and over-the-counter medicines.  Any problems you or family members have had with anesthetic medicines.  Any blood disorders you have.  Any surgeries you have had.  Any medical conditions you have.  Whether you are pregnant or may be pregnant. What are the risks? Generally, this is a safe procedure. However, problems may occur, including:  Infection.  Bleeding.  Allergic reactions to medicines.  Damage to other structures or organs.  Decreased range of motion of the knee.  Instability of the knee.  Loosening of the prosthetic joint.  Knee pain that does not go away (chronic pain).  What happens before the procedure?  Ask your health care provider about: ? Changing or stopping your regular medicines. This is especially important if you are taking diabetes medicines or blood thinners. ? Taking medicines such as aspirin and ibuprofen. These medicines can thin your blood. Do not take these medicines before your procedure if your health care provider instructs you not to.  Have dental care and routine cleanings completed before your procedure. Plan to not have dental work done for 3 months after your  procedure. Germs from anywhere in your body, including your mouth, can travel to your new joint and infect it.  Follow instructions from your health care provider about eating or drinking restrictions.  Ask your health care provider how your surgical site will be marked or identified.  You may be given antibiotic medicine to help prevent infection.  If your health care provider prescribes physical therapy, do exercises as instructed.  Do not use any tobacco products, such as cigarettes, chewing tobacco, or e-cigarettes. If you need help quitting, ask your health care provider.  You may have a physical exam.  You may have tests, such as: ? X-rays. ? MRI. ? CT scan. ? Bone scans.  You may have a blood or urine sample taken.  Plan to have someone take you home after the procedure.  If you will be going home right after the procedure, plan to have someone with you for at least 24 hours. It is recommended that you have someone to help care for you for at least 4-6 weeks after your procedure. What happens during the procedure?  To reduce your risk of infection: ? Your health care team will wash or sanitize their hands. ? Your skin will be washed with soap.  An IV tube will be inserted into one of your veins.  You will be given one or more of the following: ? A medicine to help you relax (sedative). ? A medicine to numb the area (local anesthetic). ? A medicine to make you fall asleep (general anesthetic). ? A medicine that is injected into your spine  to numb the area below and slightly above the injection site (spinal anesthetic). ? A medicine that is injected into an area of your body to numb everything below the injection site (regional anesthetic).  An incision will be made in your knee.  Damaged cartilage and bone will be removed from your femur, tibia, and patella.  Parts of the prosthesis (liners) will be placed over the areas of bone and cartilage that were removed. A  metal liner will be placed over your femur, and plastic liners will be placed over your tibia and the underside of your patella.  One or more small tubes (drains) may be placed near your incision to help drain extra fluid from your surgical site.  Your incision will be closed with stitches (sutures), skin glue, or adhesive strips. Medicine may be applied to your incision.  A bandage (dressing) will be placed over your incision. The procedure may vary among health care providers and hospitals. What happens after the procedure?  Your blood pressure, heart rate, breathing rate, and blood oxygen level will be monitored often until the medicines you were given have worn off.  You may continue to receive fluids and medicines through an IV tube.  You will have some pain. Pain medicines will be available to help you.  You may have fluid coming from one or more drains in your incision.  You may have to wear compression stockings. These stockings help to prevent blood clots and reduce swelling in your legs.  You will be encouraged to move around as much as possible.  You may be given a continuous passive motion machine to use at home. You will be shown how to use this machine.  Do not drive for 24 hours if you received a sedative. This information is not intended to replace advice given to you by your health care provider. Make sure you discuss any questions you have with your health care provider. Document Released: 08/02/2000 Document Revised: 05/29/2016 Document Reviewed: 04/02/2015 Elsevier Interactive Patient Education  [employer].

## 2018-01-02 ENCOUNTER — Encounter: Payer: Medicare HMO | Admitting: Physician Assistant

## 2018-03-13 ENCOUNTER — Encounter: Payer: Self-pay | Admitting: Physician Assistant

## 2018-03-13 ENCOUNTER — Ambulatory Visit (INDEPENDENT_AMBULATORY_CARE_PROVIDER_SITE_OTHER): Payer: Medicare HMO | Admitting: Physician Assistant

## 2018-03-13 VITALS — BP 150/75 | HR 67 | Temp 97.7°F | Ht 60.0 in | Wt 225.0 lb

## 2018-03-13 DIAGNOSIS — M79672 Pain in left foot: Secondary | ICD-10-CM | POA: Diagnosis not present

## 2018-03-13 DIAGNOSIS — M545 Low back pain, unspecified: Secondary | ICD-10-CM

## 2018-03-13 DIAGNOSIS — R82998 Other abnormal findings in urine: Secondary | ICD-10-CM

## 2018-03-13 DIAGNOSIS — R300 Dysuria: Secondary | ICD-10-CM

## 2018-03-13 LAB — POCT URINALYSIS DIPSTICK
BILIRUBIN UA: NEGATIVE
Blood, UA: NEGATIVE
Glucose, UA: NEGATIVE
KETONES UA: NEGATIVE
NITRITE UA: NEGATIVE
PROTEIN UA: NEGATIVE
Spec Grav, UA: 1.015 (ref 1.010–1.025)
Urobilinogen, UA: 0.2 E.U./dL
pH, UA: 5 (ref 5.0–8.0)

## 2018-03-13 MED ORDER — DICLOFENAC SODIUM 1 % TD GEL: 4.0000 g | Freq: Four times a day (QID) | TRANSDERMAL | 11 refills | Status: DC

## 2018-03-13 MED ORDER — NITROFURANTOIN MONOHYD MACRO 100 MG PO CAPS: 100.0000 mg | ORAL_CAPSULE | Freq: Two times a day (BID) | ORAL | 0 refills | Status: DC

## 2018-03-13 NOTE — Progress Notes (Signed)
Subjective:    Patient ID: Nicole Giles, female    DOB: 07/09/1945, 72 y.o.   MRN: [id number]  HPI  Pt is a 72 yo obese female who presents to the clinic with painful urination and increase in frequency for last few days. She starting drinking cranberry juice but continues to "ache in the middle of the back and lower abdomen". She denies any fever, chills, flank pain, n/v/d. She denies any injury to her low back.    She is frustrated with her weight. She has been on numerous diet plans and tried phentermine and belviq with no consistent weight loss. She wonders what the next step is.   She mentions her left heel pain as well. Ongoing for a few weeks. Some days worse than others. Only hurts when applies pressure such as walking. Not done anything to make better.   Not able to get dilclofenac gel due to cost when rx last.   .. Active Ambulatory Problems    Diagnosis Date Noted  . HYPERLIPIDEMIA 08/28/2007  . Morbid obesity ([place]) 11/17/2006  . HEADACHE, TENSION 10/12/2006  . OBSTRUCTIVE SLEEP APNEA 09/21/2007  . VERTIGO, BENIGN PAROXYSMAL POSITION 02/15/2007  . 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 GERD 08/22/2008  . FATTY LIVER DISEASE 09/04/2008  . UNSPECIFIED DISORDER OF KIDNEY AND URETER 09/02/2008  . ARTHRITIS, KNEES, BILATERAL 06/01/2007  . CERVICALGIA 06/13/2008  . Pain in limb 11/13/2007  . DIZZINESS 08/22/2008  . FATIGUE 12/08/2006  . DEPENDENT EDEMA, LEGS 08/25/2007  . DYSPNEA 08/22/2008  . CHEST PAIN 06/30/2007  . FREQUENCY, URINARY 09/09/2008  . TRANSAMINASES, SERUM, ELEVATED 08/27/2008  . INJURY OTHER AND UNSPECIFIED FINGER 10/04/2008  . Hyperlipemia 12/17/2013  . Arthralgia 12/17/2013  . Body aches 12/17/2013  . Elevated fasting glucose 01/16/2014  . Pre-diabetes 01/23/2014  . Primary osteoarthritis of right knee 08/15/2014  . Peripheral edema 12/31/2015  . Elevated blood pressure 12/31/2015  . Polymyalgia ([place]) 08/11/2016  . No energy 08/19/2016  . Myalgia  08/19/2016  . Cough 08/19/2016  . 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 Fatigue 09/19/2016  . Elevated C-reactive protein (CRP) 09/19/2016  . Acute pain of left shoulder 09/19/2016  . Pain of left calf 01/16/2017  . Post-traumatic osteoarthritis of right knee 11/30/2017   Resolved Ambulatory Problems    Diagnosis Date Noted  . INFLUENZA DUE TO ID NOVEL H1N1 INFLUENZA VIRUS 03/20/2008  . UTI 11/29/2008  . Pyelonephritis 08/19/2016   Past Medical History:  Diagnosis Date  . GERD (gastroesophageal reflux disease)   . H/O blood clots      Review of Systems See HPI.     Objective:   Physical Exam  Constitutional: She is oriented to person, place, and time. She appears well-developed and well-nourished.  HENT:  Head: Normocephalic and atraumatic.  Cardiovascular: Normal rate and regular rhythm.  Pulmonary/Chest: Effort normal.  No CVA tenderness.   Abdominal: Soft. Bowel sounds are normal. She exhibits no distension. There is no tenderness. There is no rebound and no guarding.  Musculoskeletal:  Pain to palpation over left heel.  No pain with plantar or dorsi flexion.   Pain over back is to the side of lumbar spine to palpation. Paraspinal muscles are very tight.   Neurological: She is alert and oriented to person, place, and time.  Skin: No rash noted.  Psychiatric: She has a normal mood and affect. Her behavior is normal.          Assessment & Plan:  Marland KitchenMarland KitchenDiagnoses and  all orders for this visit:  Burning with urination -     POCT Urinalysis Dipstick -     Urine Culture  Acute left-sided low back pain without sciatica -     diclofenac sodium (VOLTAREN) 1 % GEL; Apply 4 g topically 4 (four) times daily. To affected joint.  Leukocytes in urine -     nitrofurantoin, macrocrystal-monohydrate, (MACROBID) 100 MG capsule; Take 1 capsule (100 mg total) by mouth 2 (two) times daily.  Pain of left heel -     diclofenac sodium (VOLTAREN) 1 % GEL;  Apply 4 g topically 4 (four) times daily. To affected joint.  Morbid obesity ([place]) -     Amb Referral to Bariatric Surgery   .[name] Results for orders placed or performed in visit on 03/13/18  Urine Culture  Result Value Ref Range   MICRO NUMBER: [id number]    SPECIMEN QUALITY: ADEQUATE    Sample Source URINE    STATUS: PRELIMINARY    ISOLATE 1: Escherichia coli (A)   POCT Urinalysis Dipstick  Result Value Ref Range   Color, UA yellow    Clarity, UA clear    Glucose, UA Negative Negative   Bilirubin, UA negative    Ketones, UA negative    Spec Grav, UA 1.015 1.010 - 1.025   Blood, UA negative    pH, UA 5.0 5.0 - 8.0   Protein, UA Negative Negative   Urobilinogen, UA 0.2 0.2 or 1.0 E.U./dL   Nitrite, UA negative    Leukocytes, UA Small (1+) (A) Negative   Appearance     Odor     UA positive for leukocytes. Will culture but due to symptoms will go ahead and treat with macrobid.   Suspect heel pain is due to plantar fasciitis. Given diclofenac gel. Encouraged good supportive shoes. Ice with frozen water bottle. Follow up with sports medicine for further intervention.   Low back pain seems a little low for any kidney pain. Suggest heat, diclofenac, salonpas patches, and tens unit. Pt has a tens unit at home but does not know how to use it. I told her she could come in for nurse visit to be taught how to use.   Weight is making back and heel pain worse. We have tried diet and patients has went up and down. I think referral is necessary.   [name].Spent 30 minutes with patient and greater than 50 percent of visit spent counseling patient regarding treatment plan.

## 2018-03-13 NOTE — Progress Notes (Signed)
150/75 67 97.7 Oral

## 2018-03-13 NOTE — Patient Instructions (Addendum)
Plantar Fasciitis Rehab Ask your health care provider which exercises are safe for you. Do exercises exactly as told by your health care provider and adjust them as directed. It is normal to feel mild stretching, pulling, tightness, or discomfort as you do these exercises, but you should stop right away if you feel sudden pain or your pain gets worse. Do not begin these exercises until told by your health care provider. Stretching and range of motion exercises These exercises warm up your muscles and joints and improve the movement and flexibility of your foot. These exercises also help to relieve pain. Exercise A: Plantar fascia stretch  1. Sit with your left / right leg crossed over your opposite knee. 2. Hold your heel with one hand with that thumb near your arch. With your other hand, hold your toes and gently pull them back toward the top of your foot. You should feel a stretch on the bottom of your toes or your foot or both. 3. Hold this stretch for__________ seconds. 4. Slowly release your toes and return to the starting position. Repeat __________ times. Complete this exercise __________ times a day. Exercise B: Gastroc, standing  1. Stand with your hands against a wall. 2. Extend your left / right leg behind you, and bend your front knee slightly. 3. Keeping your heels on the floor and keeping your back knee straight, shift your weight toward the wall without arching your back. You should feel a gentle stretch in your left / right calf. 4. Hold this position for __________ seconds. Repeat __________ times. Complete this exercise __________ times a day. Exercise C: Soleus, standing 1. Stand with your hands against a wall. 2. Extend your left / right leg behind you, and bend your front knee slightly. 3. Keeping your heels on the floor, bend your back knee and slightly shift your weight over the back leg. You should feel a gentle stretch deep in your calf. 4. Hold this position for  __________ seconds. Repeat __________ times. Complete this exercise __________ times a day. Exercise D: Gastrocsoleus, standing 1. Stand with the ball of your left / right foot on a step. The ball of your foot is on the walking surface, right under your toes. 2. Keep your other foot firmly on the same step. 3. Hold onto the wall or a railing for balance. 4. Slowly lift your other foot, allowing your body weight to press your heel down over the edge of the step. You should feel a stretch in your left / right calf. 5. Hold this position for __________ seconds. 6. Return both feet to the step. 7. Repeat this exercise with a slight bend in your left / right knee. Repeat __________ times with your left / right knee straight and __________ times with your left / right knee bent. Complete this exercise __________ times a day. Balance exercise This exercise builds your balance and strength control of your arch to help take pressure off your plantar fascia. Exercise E: Single leg stand 1. Without shoes, stand near a railing or in a doorway. You may hold onto the railing or door frame as needed. 2. Stand on your left / right foot. Keep your big toe down on the floor and try to keep your arch lifted. Do not let your foot roll inward. 3. Hold this position for __________ seconds. 4. If this exercise is too easy, you can try it with your eyes closed or while standing on a pillow. Repeat __________ times. Complete  this exercise __________ times a day. This information is not intended to replace advice given to you by your health care provider. Make sure you discuss any questions you have with your health care provider. Document Released: 04/26/2005 Document Revised: 12/30/2015 Document Reviewed: 03/10/2015 Elsevier Interactive Patient Education  2018 [place]. Sacroiliac Joint Dysfunction Sacroiliac joint dysfunction is a condition that causes inflammation on one or both sides of the sacroiliac (SI)  joint. The SI joint connects the lower part of the spine (sacrum) with the two upper portions of the pelvis (ilium). This condition causes deep aching or burning pain in the low back. In some cases, the pain may also spread into one or both buttocks or hips or spread down the legs. What are the causes? This condition may be caused by:  Pregnancy. During pregnancy, extra stress is put on the SI joints because the pelvis widens.  Injury, such as: ? Car accidents. ? Sport-related injuries. ? Work-related injuries.  Having one leg that is shorter than the other.  Conditions that affect the joints, such as: ? Rheumatoid arthritis. ? Gout. ? Psoriatic arthritis. ? Joint infection (septic arthritis).  Sometimes, the cause of SI joint dysfunction is not known. What are the signs or symptoms? Symptoms of this condition include:  Aching or burning pain in the lower back. The pain may also spread to other areas, such as: ? Buttocks. ? Groin. ? Thighs and legs.  Muscle spasms in or around the painful areas.  Increased pain when standing, walking, running, stair climbing, bending, or lifting.  How is this diagnosed? Your health care provider will do a physical exam and take your medical history. During the exam, the health care provider may move one or both of your legs to different positions to check for pain. Various tests may be done to help verify the diagnosis, including:  Imaging tests to look for other causes of pain. These may include: ? MRI. ? CT scan. ? Bone scan.  Diagnostic injection. A numbing medicine is injected into the SI joint using a needle. If the pain is temporarily improved or stopped after the injection, this can indicate that SI joint dysfunction is the problem.  How is this treated? Treatment may vary depending on the cause and severity of your condition. Treatment options may include:  Applying ice or heat to the lower back area. This can help to reduce pain  and muscle spasms.  Medicines to relieve pain or inflammation or to relax the muscles.  Wearing a back brace (sacroiliac brace) to help support the joint while your back is healing.  Physical therapy to increase muscle strength around the joint and flexibility at the joint. This may also involve learning proper body positions and ways of moving to relieve stress on the joint.  Direct manipulation of the SI joint.  Injections of steroid medicine into the joint in order to reduce pain and swelling.  Radiofrequency ablation to burn away nerves that are carrying pain messages from the joint.  Use of a device that provides electrical stimulation in order to reduce pain at the joint.  Surgery to put in screws and plates that limit or prevent joint motion. This is rare.  Follow these instructions at home:  Rest as needed. Limit your activities as directed by your health care provider.  Take medicines only as directed by your health care provider.  If directed, apply ice to the affected area: ? Put ice in a plastic bag. ? Place a  towel between your skin and the bag. ? Leave the ice on for 20 minutes, 2-3 times per day.  Use a heating pad or a moist heat pack as directed by your health care provider.  Exercise as directed by your health care provider or physical therapist.  Keep all follow-up visits as directed by your health care provider. This is important. Contact a health care provider if:  Your pain is not controlled with medicine.  You have a fever.  You have increasingly severe pain. Get help right away if:  You have weakness, numbness, or tingling in your legs or feet.  You lose control of your bladder or bowel. This information is not intended to replace advice given to you by your health care provider. Make sure you discuss any questions you have with your health care provider. Document Released: 07/23/2008 Document Revised: 10/02/2015 Document Reviewed:  01/01/2014 Elsevier Interactive Patient Education  [employer].

## 2018-03-15 ENCOUNTER — Ambulatory Visit: Payer: Medicare HMO | Admitting: Physician Assistant

## 2018-03-15 ENCOUNTER — Encounter: Payer: Self-pay | Admitting: Physician Assistant

## 2018-03-15 ENCOUNTER — Telehealth: Payer: Self-pay | Admitting: Physician Assistant

## 2018-03-15 NOTE — Telephone Encounter (Signed)
Called and advised pt. She has been scheduled for wellness visit on 04-04-18 and wants to discuss getting mammogram/colonoscopy at that time

## 2018-03-15 NOTE — Telephone Encounter (Signed)
Looking over chart patient needs colonoscopy and mammogram. She really needs complete physical if she would like to schedule but if she would like to go ahead we could order colonoscopy and mammogram.

## 2018-03-16 LAB — URINE CULTURE
MICRO NUMBER:: 91326913
SPECIMEN QUALITY: ADEQUATE

## 2018-03-16 NOTE — Progress Notes (Signed)
Call pt: macrobid already given and should treat the e.coli found in urine. You certainly have a UTI. Let us know if not feeling better.

## 2018-04-04 ENCOUNTER — Encounter: Payer: Self-pay | Admitting: Physician Assistant

## 2018-04-04 ENCOUNTER — Ambulatory Visit (INDEPENDENT_AMBULATORY_CARE_PROVIDER_SITE_OTHER): Payer: Medicare HMO | Admitting: Physician Assistant

## 2018-04-04 VITALS — BP 123/68 | HR 74 | Temp 98.7°F | Ht 59.5 in | Wt 220.0 lb

## 2018-04-04 DIAGNOSIS — Z1211 Encounter for screening for malignant neoplasm of colon: Secondary | ICD-10-CM

## 2018-04-04 DIAGNOSIS — Z23 Encounter for immunization: Secondary | ICD-10-CM | POA: Diagnosis not present

## 2018-04-04 DIAGNOSIS — Z13820 Encounter for screening for osteoporosis: Secondary | ICD-10-CM | POA: Diagnosis not present

## 2018-04-04 DIAGNOSIS — Z0000 Encounter for general adult medical examination without abnormal findings: Secondary | ICD-10-CM

## 2018-04-04 DIAGNOSIS — E785 Hyperlipidemia, unspecified: Secondary | ICD-10-CM | POA: Diagnosis not present

## 2018-04-04 DIAGNOSIS — Z131 Encounter for screening for diabetes mellitus: Secondary | ICD-10-CM

## 2018-04-04 DIAGNOSIS — H9190 Unspecified hearing loss, unspecified ear: Secondary | ICD-10-CM

## 2018-04-04 DIAGNOSIS — Z13220 Encounter for screening for lipoid disorders: Secondary | ICD-10-CM | POA: Diagnosis not present

## 2018-04-04 DIAGNOSIS — Z1239 Encounter for other screening for malignant neoplasm of breast: Secondary | ICD-10-CM | POA: Diagnosis not present

## 2018-04-04 NOTE — Progress Notes (Signed)
Subjective:   SAMANATHA BRAMMER is a 72 y.o. female who presents for an Initial Medicare Annual Wellness Visit.  Review of Systems    No problems.         Objective:    Today's Vitals   04/04/18 1039  BP: 123/68  Pulse: 74  Temp: 98.7 F (37.1 C)  TempSrc: Oral  Weight: 220 lb (99.8 kg)  Height: 4' 11.5" (1.511 m)   Body mass index is 43.69 kg/m.  No flowsheet data found.  Current Medications (verified) Outpatient Encounter Medications as of 04/04/2018  Medication Sig  . diclofenac sodium (VOLTAREN) 1 % GEL Apply 4 g topically 4 (four) times daily. To affected joint.  [name] esomeprazole (NEXIUM) 40 MG capsule Take 1 capsule (40 mg total) by mouth daily at 12 noon.  [name] ibuprofen (ADVIL,MOTRIN) 800 MG tablet Take 1 tablet (800 mg total) by mouth every 8 (eight) hours as needed.  . [DISCONTINUED] nitrofurantoin, macrocrystal-monohydrate, (MACROBID) 100 MG capsule Take 1 capsule (100 mg total) by mouth 2 (two) times daily.   No facility-administered encounter medications on file as of 04/04/2018.     Allergies (verified) Codeine and Meperidine hcl   History: Past Medical History:  Diagnosis Date  . GERD (gastroesophageal reflux disease)   . H/O blood clots    in left leg after left arm surgery in 05/2006 ----- off coumadin  . Morbid obesity ([place]) 11/17/2006   Qualifier: Diagnosis of  By: Madilyn Fireman MD, Barnetta Chapel    . OBSTRUCTIVE SLEEP APNEA 09/21/2007   Qualifier: Diagnosis of  By: Madilyn Fireman MD, Barnetta Chapel    . Polymyalgia ([place]) 08/11/2016   Past Surgical History:  Procedure Laterality Date  . CHOLECYSTECTOMY    . CYSTECTOMY    . left arm fracture    . NECK SURGERY     cyst removal  . right knee surgery     Family History  Problem Relation Age of Onset  . Prostate cancer Unknown   . Diabetes Unknown   . Diabetes Brother    Social History   Socioeconomic History  . Marital status: Single    Spouse name: Not on file  . Number of children: Not on file  . Years of  education: Not on file  . Highest education level: Not on file  Occupational History  . Not on file  Social Needs  . Financial resource strain: Not on file  . Food insecurity:    Worry: Not on file    Inability: Not on file  . Transportation needs:    Medical: Not on file    Non-medical: Not on file  Tobacco Use  . Smoking status: Former Research scientist (life sciences)  . Smokeless tobacco: Current User  Substance and Sexual Activity  . Alcohol use: No  . Drug use: No  . Sexual activity: Never  Lifestyle  . Physical activity:    Days per week: Not on file    Minutes per session: Not on file  . Stress: Not on file  Relationships  . Social connections:    Talks on phone: Not on file    Gets together: Not on file    Attends religious service: Not on file    Active member of club or organization: Not on file    Attends meetings of clubs or organizations: Not on file    Relationship status: Not on file  Other Topics Concern  . Not on file  Social History Narrative  . Not on file    Tobacco  Counseling Ready to quit: Not Answered Counseling given: Not Answered   Clinical Intake:                        Activities of Daily Living In your present state of health, do you have any difficulty performing the following activities: 04/04/2018  Hearing? Y  Comment trouble hearing someone in another room and even at work.   Vision? N  Difficulty concentrating or making decisions? N  Walking or climbing stairs? Y  Dressing or bathing? N  Doing errands, shopping? N  Some recent data might be hidden     Immunizations and Health Maintenance Immunization History  Administered Date(s) Administered  . Tdap 04/04/2018   Health Maintenance Due  Topic Date Due  . COLONOSCOPY  12/27/1997  . MAMMOGRAM  01/04/2009    Patient Care Team: Lavada Mesi as PCP - General (Family Medicine)  Indicate any recent Medical Services you may have received from other than [facility] providers in  the past year (date may be approximate).     Assessment:   This is a routine wellness examination for Dorlene.  Hearing/Vision screen Hearing:  Weber lateralized the same to both ears.  Rinne AC greater than BC both ears.   Dietary issues and exercise activities discussed:    Goals   None    Depression Screen PHQ 2/9 Scores 04/04/2018 03/13/2018 01/14/2017 09/16/2016  PHQ - 2 Score 0 1 0 0  PHQ- 9 Score 9 13 - 10    Fall Risk Fall Risk  04/09/2018  Falls in the past year? 0  Follow up Falls evaluation completed;Education provided    Is the patient's home free of loose throw rugs in walkways, pet beds, electrical cords, etc?   no      Grab bars in the bathroom? yes      Handrails on the stairs?   yes      Adequate lighting?   yes  Timed Get Up and Go Performed   Cognitive Function:     6CIT Screen 04/04/2018  What Year? 0 points  What month? 0 points  What time? 0 points  Count back from 20 0 points  Months in reverse 0 points  Repeat phrase 4 points  Total Score 4    Screening Tests Health Maintenance  Topic Date Due  . COLONOSCOPY  12/27/1997  . MAMMOGRAM  01/04/2009  . INFLUENZA VACCINE  03/16/2019 (Originally 12/08/2017)  . PNA vac Low Risk Adult (1 of 2 - PCV13) 04/05/2019 (Originally 12/27/2012)  . TETANUS/TDAP  04/04/2028  . DEXA SCAN  Completed  . Hepatitis C Screening  Completed    Qualifies for Shingles Vaccine? Not done.   Cancer Screenings: Lung: Low Dose CT Chest recommended if Age 102-80 years, 30 pack-year currently smoking OR have quit w/in 15years. Patient does not qualify. Breast: Up to date on Mammogram? No   Up to date of Bone Density/Dexa? No Colorectal: no   Additional Screenings: done Hepatitis C Screening:      Plan:    I have personally reviewed and noted the following in the patient's chart:   . Medical and social history . Use of alcohol, tobacco or illicit drugs  . Current medications and supplements . Functional  ability and status . Nutritional status . Physical activity . Advanced directives . List of other physicians . Hospitalizations, surgeries, and ER visits in previous 12 months . Vitals . Screenings to include cognitive, depression, and  falls . Referrals and appointments  In addition, I have reviewed and discussed with patient certain preventive protocols, quality metrics, and best practice recommendations. A written personalized care plan for preventive services as well as general preventive health recommendations were provided to patient.    cologuard ordered.  Mammogram and bone density ordered.  Pt declined flu, pneumonia and shingrix vaccine.  Referral made for audiology.   Iran Planas, PA-C   04/09/2018

## 2018-04-04 NOTE — Patient Instructions (Signed)
Health Maintenance for Postmenopausal Women Menopause is a normal process in which your reproductive ability comes to an end. This process happens gradually over a span of months to years, usually between the ages of 22 and 9. Menopause is complete when you have missed 12 consecutive menstrual periods. It is important to talk with your health care provider about some of the most common conditions that affect postmenopausal women, such as heart disease, cancer, and bone loss (osteoporosis). Adopting a healthy lifestyle and getting preventive care can help to promote your health and wellness. Those actions can also lower your chances of developing some of these common conditions. What should I know about menopause? During menopause, you may experience a number of symptoms, such as:  Moderate-to-severe hot flashes.  Night sweats.  Decrease in sex drive.  Mood swings.  Headaches.  Tiredness.  Irritability.  Memory problems.  Insomnia.  Choosing to treat or not to treat menopausal changes is an individual decision that you make with your health care provider. What should I know about hormone replacement therapy and supplements? Hormone therapy products are effective for treating symptoms that are associated with menopause, such as hot flashes and night sweats. Hormone replacement carries certain risks, especially as you become older. If you are thinking about using estrogen or estrogen with progestin treatments, discuss the benefits and risks with your health care provider. What should I know about heart disease and stroke? Heart disease, heart attack, and stroke become more likely as you age. This may be due, in part, to the hormonal changes that your body experiences during menopause. These can affect how your body processes dietary fats, triglycerides, and cholesterol. Heart attack and stroke are both medical emergencies. There are many things that you can do to help prevent heart disease  and stroke:  Have your blood pressure checked at least every 1-2 years. High blood pressure causes heart disease and increases the risk of stroke.  If you are 70-70 years old, ask your health care provider if you should take aspirin to prevent a heart attack or a stroke.  Do not use any tobacco products, including cigarettes, chewing tobacco, or electronic cigarettes. If you need help quitting, ask your health care provider.  It is important to eat a healthy diet and maintain a healthy weight. ? Be sure to include plenty of vegetables, fruits, low-fat dairy products, and lean protein. ? Avoid eating foods that are high in solid fats, added sugars, or salt (sodium).  Get regular exercise. This is one of the most important things that you can do for your health. ? Try to exercise for at least 150 minutes each week. The type of exercise that you do should increase your heart rate and make you sweat. This is known as moderate-intensity exercise. ? Try to do strengthening exercises at least twice each week. Do these in addition to the moderate-intensity exercise.  Know your numbers.Ask your health care provider to check your cholesterol and your blood glucose. Continue to have your blood tested as directed by your health care provider.  What should I know about cancer screening? There are several types of cancer. Take the following steps to reduce your risk and to catch any cancer development as early as possible. Breast Cancer  Practice breast self-awareness. ? This means understanding how your breasts normally appear and feel. ? It also means doing regular breast self-exams. Let your health care provider know about any changes, no matter how small.  If you are 40  or older, have a clinician do a breast exam (clinical breast exam or CBE) every year. Depending on your age, family history, and medical history, it may be recommended that you also have a yearly breast X-ray (mammogram).  If you  have a family history of breast cancer, talk with your health care provider about genetic screening.  If you are at high risk for breast cancer, talk with your health care provider about having an MRI and a mammogram every year.  Breast cancer (BRCA) gene test is recommended for women who have family members with BRCA-related cancers. Results of the assessment will determine the need for genetic counseling and BRCA1 and for BRCA2 testing. BRCA-related cancers include these types: ? Breast. This occurs in males or females. ? Ovarian. ? Tubal. This may also be called fallopian tube cancer. ? Cancer of the abdominal or pelvic lining (peritoneal cancer). ? Prostate. ? Pancreatic.  Cervical, Uterine, and Ovarian Cancer Your health care provider may recommend that you be screened regularly for cancer of the pelvic organs. These include your ovaries, uterus, and vagina. This screening involves a pelvic exam, which includes checking for microscopic changes to the surface of your cervix (Pap test).  For women ages 21-65, health care providers may recommend a pelvic exam and a Pap test every three years. For women ages 79-65, they may recommend the Pap test and pelvic exam, combined with testing for human papilloma virus (HPV), every five years. Some types of HPV increase your risk of cervical cancer. Testing for HPV may also be done on women of any age who have unclear Pap test results.  Other health care providers may not recommend any screening for nonpregnant women who are considered low risk for pelvic cancer and have no symptoms. Ask your health care provider if a screening pelvic exam is right for you.  If you have had past treatment for cervical cancer or a condition that could lead to cancer, you need Pap tests and screening for cancer for at least 20 years after your treatment. If Pap tests have been discontinued for you, your risk factors (such as having a new sexual partner) need to be  reassessed to determine if you should start having screenings again. Some women have medical problems that increase the chance of getting cervical cancer. In these cases, your health care provider may recommend that you have screening and Pap tests more often.  If you have a family history of uterine cancer or ovarian cancer, talk with your health care provider about genetic screening.  If you have vaginal bleeding after reaching menopause, tell your health care provider.  There are currently no reliable tests available to screen for ovarian cancer.  Lung Cancer Lung cancer screening is recommended for adults 69-62 years old who are at high risk for lung cancer because of a history of smoking. A yearly low-dose CT scan of the lungs is recommended if you:  Currently smoke.  Have a history of at least 30 pack-years of smoking and you currently smoke or have quit within the past 15 years. A pack-year is smoking an average of one pack of cigarettes per day for one year.  Yearly screening should:  Continue until it has been 15 years since you quit.  Stop if you develop a health problem that would prevent you from having lung cancer treatment.  Colorectal Cancer  This type of cancer can be detected and can often be prevented.  Routine colorectal cancer screening usually begins at  age 42 and continues through age 45.  If you have risk factors for colon cancer, your health care provider may recommend that you be screened at an earlier age.  If you have a family history of colorectal cancer, talk with your health care provider about genetic screening.  Your health care provider may also recommend using home test kits to check for hidden blood in your stool.  A small camera at the end of a tube can be used to examine your colon directly (sigmoidoscopy or colonoscopy). This is done to check for the earliest forms of colorectal cancer.  Direct examination of the colon should be repeated every  5-10 years until age 71. However, if early forms of precancerous polyps or small growths are found or if you have a family history or genetic risk for colorectal cancer, you may need to be screened more often.  Skin Cancer  Check your skin from head to toe regularly.  Monitor any moles. Be sure to tell your health care provider: ? About any new moles or changes in moles, especially if there is a change in a mole's shape or color. ? If you have a mole that is larger than the size of a pencil eraser.  If any of your family members has a history of skin cancer, especially at a young age, talk with your health care provider about genetic screening.  Always use sunscreen. Apply sunscreen liberally and repeatedly throughout the day.  Whenever you are outside, protect yourself by wearing long sleeves, pants, a wide-brimmed hat, and sunglasses.  What should I know about osteoporosis? Osteoporosis is a condition in which bone destruction happens more quickly than new bone creation. After menopause, you may be at an increased risk for osteoporosis. To help prevent osteoporosis or the bone fractures that can happen because of osteoporosis, the following is recommended:  If you are 46-71 years old, get at least 1,000 mg of calcium and at least 600 mg of vitamin D per day.  If you are older than age 55 but younger than age 65, get at least 1,200 mg of calcium and at least 600 mg of vitamin D per day.  If you are older than age 54, get at least 1,200 mg of calcium and at least 800 mg of vitamin D per day.  Smoking and excessive alcohol intake increase the risk of osteoporosis. Eat foods that are rich in calcium and vitamin D, and do weight-bearing exercises several times each week as directed by your health care provider. What should I know about how menopause affects my mental health? Depression may occur at any age, but it is more common as you become older. Common symptoms of depression  include:  Low or sad mood.  Changes in sleep patterns.  Changes in appetite or eating patterns.  Feeling an overall lack of motivation or enjoyment of activities that you previously enjoyed.  Frequent crying spells.  Talk with your health care provider if you think that you are experiencing depression. What should I know about immunizations? It is important that you get and maintain your immunizations. These include:  Tetanus, diphtheria, and pertussis (Tdap) booster vaccine.  Influenza every year before the flu season begins.  Pneumonia vaccine.  Shingles vaccine.  Your health care provider may also recommend other immunizations. This information is not intended to replace advice given to you by your health care provider. Make sure you discuss any questions you have with your health care provider. Document Released: 06/18/2005  Document Revised: 11/14/2015 Document Reviewed: 01/28/2015 Elsevier Interactive Patient Education  2018 [employer] Inc.  

## 2018-04-05 LAB — COMPLETE METABOLIC PANEL WITH GFR
AG RATIO: 1.8 (calc) (ref 1.0–2.5)
ALT: 49 U/L — AB (ref 6–29)
AST: 53 U/L — ABNORMAL HIGH (ref 10–35)
Albumin: 4.2 g/dL (ref 3.6–5.1)
Alkaline phosphatase (APISO): 135 U/L — ABNORMAL HIGH (ref 33–130)
BILIRUBIN TOTAL: 0.8 mg/dL (ref 0.2–1.2)
BUN: 13 mg/dL (ref 7–25)
CHLORIDE: 103 mmol/L (ref 98–110)
CO2: 26 mmol/L (ref 20–32)
Calcium: 9.7 mg/dL (ref 8.6–10.4)
Creat: 0.63 mg/dL (ref 0.60–0.93)
GFR, EST AFRICAN AMERICAN: 105 mL/min/{1.73_m2} (ref >=60)
GFR, Est Non African American: 91 mL/min/{1.73_m2} (ref >=60)
GLUCOSE: 125 mg/dL — AB (ref 65–99)
Globulin: 2.3 g/dL (calc) (ref 1.9–3.7)
POTASSIUM: 4.5 mmol/L (ref 3.5–5.3)
Sodium: 138 mmol/L (ref 135–146)
TOTAL PROTEIN: 6.5 g/dL (ref 6.1–8.1)

## 2018-04-05 LAB — HEMOGLOBIN A1C
Hgb A1c MFr Bld: 6.3 % of total Hgb — ABNORMAL HIGH (ref <5.7)
MEAN PLASMA GLUCOSE: 134 (calc)
eAG (mmol/L): 7.4 (calc)

## 2018-04-05 LAB — LIPID PANEL W/REFLEX DIRECT LDL
CHOL/HDL RATIO: 5.7 (calc) — AB (ref <5.0)
Cholesterol: 210 mg/dL — ABNORMAL HIGH (ref <200)
HDL: 37 mg/dL — AB (ref >50)
LDL Cholesterol (Calc): 139 mg/dL (calc) — ABNORMAL HIGH
NON-HDL CHOLESTEROL (CALC): 173 mg/dL — AB (ref <130)
TRIGLYCERIDES: 199 mg/dL — AB (ref <150)

## 2018-04-05 NOTE — Progress Notes (Signed)
Call pt: CV risk is 12.3 percent in 10 years. This is elevated and needs a statin on board to reduce CV risk.   A!C is elevated but not quite diabetes yet. Usually we start with metformin to help lower this. What are your thoughts?   Liver enzymes are elevated as well. Are you taking a lot of tylenol? You have fatty liver which can cause it.

## 2018-04-11 NOTE — Progress Notes (Signed)
Start lipitor first then we can add metformin in later. Lipitor has a greater CV risk reduction.   Ok to send lipitor 40mg  I po qd #90 1 refill recheck labs in 6 months.   If tolerate then would consider adding metformin in the next month.

## 2018-04-12 ENCOUNTER — Other Ambulatory Visit: Payer: Self-pay | Admitting: Physician Assistant

## 2018-04-12 DIAGNOSIS — E7800 Pure hypercholesterolemia, unspecified: Secondary | ICD-10-CM

## 2018-04-12 MED ORDER — ATORVASTATIN CALCIUM 40 MG PO TABS: 40.0000 mg | ORAL_TABLET | Freq: Every day | ORAL | 1 refills | Status: DC

## 2018-04-12 NOTE — Progress Notes (Signed)
Per labs:  "Notes recorded by Donella Stade, PA-C on 04/11/2018 at 3:56 PM EST  Start lipitor first then we can add metformin in later. Lipitor has a greater CV risk reduction.   Ok to send lipitor 40mg  I po qd #90 1 refill recheck labs in 6 months.   If tolerate then would consider adding metformin in the next month. "

## 2018-04-19 ENCOUNTER — Ambulatory Visit (INDEPENDENT_AMBULATORY_CARE_PROVIDER_SITE_OTHER): Payer: Medicare HMO

## 2018-04-19 DIAGNOSIS — M85852 Other specified disorders of bone density and structure, left thigh: Secondary | ICD-10-CM | POA: Diagnosis not present

## 2018-04-19 DIAGNOSIS — R928 Other abnormal and inconclusive findings on diagnostic imaging of breast: Secondary | ICD-10-CM | POA: Diagnosis not present

## 2018-04-19 DIAGNOSIS — Z1231 Encounter for screening mammogram for malignant neoplasm of breast: Secondary | ICD-10-CM | POA: Diagnosis not present

## 2018-04-19 DIAGNOSIS — Z780 Asymptomatic menopausal state: Secondary | ICD-10-CM | POA: Diagnosis not present

## 2018-04-21 ENCOUNTER — Other Ambulatory Visit: Payer: Self-pay | Admitting: Physician Assistant

## 2018-04-21 DIAGNOSIS — R928 Other abnormal and inconclusive findings on diagnostic imaging of breast: Secondary | ICD-10-CM

## 2018-04-24 NOTE — Progress Notes (Signed)
Likely does not need 5,000 units twice a day. Usual supplementation is 1000-2000 units daily with calcium 1300mg  or 4 servings. You can actually buy calcium and vitamin D supplements together.

## 2018-05-01 ENCOUNTER — Ambulatory Visit
Admission: RE | Admit: 2018-05-01 | Discharge: 2018-05-01 | Disposition: A | Discharge status: Home / Self Care | Payer: Medicare HMO | Source: Ambulatory Visit | Attending: Physician Assistant | Admitting: Physician Assistant

## 2018-05-01 ENCOUNTER — Other Ambulatory Visit: Payer: Self-pay | Admitting: Physician Assistant

## 2018-05-01 DIAGNOSIS — R921 Mammographic calcification found on diagnostic imaging of breast: Secondary | ICD-10-CM

## 2018-05-01 DIAGNOSIS — R928 Other abnormal and inconclusive findings on diagnostic imaging of breast: Secondary | ICD-10-CM

## 2018-05-05 DIAGNOSIS — Z1211 Encounter for screening for malignant neoplasm of colon: Secondary | ICD-10-CM | POA: Diagnosis not present

## 2018-05-05 DIAGNOSIS — K635 Polyp of colon: Secondary | ICD-10-CM | POA: Diagnosis not present

## 2018-05-05 DIAGNOSIS — K5730 Diverticulosis of large intestine without perforation or abscess without bleeding: Secondary | ICD-10-CM | POA: Diagnosis not present

## 2018-05-05 DIAGNOSIS — D124 Benign neoplasm of descending colon: Secondary | ICD-10-CM | POA: Diagnosis not present

## 2018-05-05 DIAGNOSIS — K642 Third degree hemorrhoids: Secondary | ICD-10-CM | POA: Diagnosis not present

## 2018-05-05 LAB — HM COLONOSCOPY

## 2018-05-09 ENCOUNTER — Ambulatory Visit
Admission: RE | Admit: 2018-05-09 | Discharge: 2018-05-09 | Disposition: A | Discharge status: Home / Self Care | Payer: Medicare HMO | Source: Ambulatory Visit | Attending: Physician Assistant | Admitting: Physician Assistant

## 2018-05-09 DIAGNOSIS — R921 Mammographic calcification found on diagnostic imaging of breast: Secondary | ICD-10-CM

## 2018-05-09 DIAGNOSIS — D241 Benign neoplasm of right breast: Secondary | ICD-10-CM | POA: Diagnosis not present

## 2018-05-12 ENCOUNTER — Encounter: Payer: Self-pay | Admitting: Physician Assistant

## 2018-05-12 DIAGNOSIS — K648 Other hemorrhoids: Secondary | ICD-10-CM | POA: Insufficient documentation

## 2018-05-12 DIAGNOSIS — K635 Polyp of colon: Secondary | ICD-10-CM | POA: Insufficient documentation

## 2018-05-12 DIAGNOSIS — D241 Benign neoplasm of right breast: Secondary | ICD-10-CM | POA: Insufficient documentation

## 2018-05-12 DIAGNOSIS — K5790 Diverticulosis of intestine, part unspecified, without perforation or abscess without bleeding: Secondary | ICD-10-CM | POA: Insufficient documentation

## 2018-05-12 NOTE — Progress Notes (Signed)
GREAT news. Pathology of breast tissue showed fibroadenoma.

## 2018-05-18 ENCOUNTER — Encounter: Payer: Self-pay | Admitting: Physician Assistant

## 2018-06-07 DIAGNOSIS — H903 Sensorineural hearing loss, bilateral: Secondary | ICD-10-CM | POA: Diagnosis not present

## 2018-06-16 DIAGNOSIS — Z6841 Body Mass Index (BMI) 40.0 and over, adult: Secondary | ICD-10-CM | POA: Diagnosis not present

## 2018-06-16 DIAGNOSIS — E785 Hyperlipidemia, unspecified: Secondary | ICD-10-CM | POA: Diagnosis not present

## 2018-06-16 DIAGNOSIS — G4733 Obstructive sleep apnea (adult) (pediatric): Secondary | ICD-10-CM | POA: Diagnosis not present

## 2018-06-16 DIAGNOSIS — R7303 Prediabetes: Secondary | ICD-10-CM | POA: Diagnosis not present

## 2018-06-16 DIAGNOSIS — K760 Fatty (change of) liver, not elsewhere classified: Secondary | ICD-10-CM | POA: Diagnosis not present

## 2018-06-29 ENCOUNTER — Telehealth: Payer: Self-pay

## 2018-06-29 NOTE — Telephone Encounter (Signed)
Patient called today and wanted me to let you know that she has stopped taking her statin, because it is causing her to have joint pain. Is there anything else she can take? Or is this just a normal side effect of the medication? Please advise. Thanks!

## 2018-06-30 NOTE — Telephone Encounter (Signed)
Being on a statin is a really powerful way to reduce risk of CV disease. Yes some people do have muscle aches and pains with statins. Any amt of a statin can be very effective. We could switch to another statin or she could try taking it every other day or every 3 days to see if her numbners decrease and she does not have side effects. What are her thoughts?

## 2018-07-13 DIAGNOSIS — K760 Fatty (change of) liver, not elsewhere classified: Secondary | ICD-10-CM | POA: Diagnosis not present

## 2018-07-13 DIAGNOSIS — E7849 Other hyperlipidemia: Secondary | ICD-10-CM | POA: Diagnosis not present

## 2018-07-13 DIAGNOSIS — Z86711 Personal history of pulmonary embolism: Secondary | ICD-10-CM | POA: Diagnosis not present

## 2018-07-13 DIAGNOSIS — D6859 Other primary thrombophilia: Secondary | ICD-10-CM | POA: Diagnosis not present

## 2018-07-13 DIAGNOSIS — R7303 Prediabetes: Secondary | ICD-10-CM | POA: Diagnosis not present

## 2018-07-13 DIAGNOSIS — G4733 Obstructive sleep apnea (adult) (pediatric): Secondary | ICD-10-CM | POA: Diagnosis not present

## 2018-07-13 DIAGNOSIS — K219 Gastro-esophageal reflux disease without esophagitis: Secondary | ICD-10-CM | POA: Diagnosis not present

## 2018-07-20 DIAGNOSIS — M7989 Other specified soft tissue disorders: Secondary | ICD-10-CM | POA: Diagnosis not present

## 2018-07-20 DIAGNOSIS — Z86711 Personal history of pulmonary embolism: Secondary | ICD-10-CM | POA: Diagnosis not present

## 2018-07-20 DIAGNOSIS — M7122 Synovial cyst of popliteal space [Baker], left knee: Secondary | ICD-10-CM | POA: Diagnosis not present

## 2018-07-20 DIAGNOSIS — R2243 Localized swelling, mass and lump, lower limb, bilateral: Secondary | ICD-10-CM | POA: Diagnosis not present

## 2018-07-21 DIAGNOSIS — R0683 Snoring: Secondary | ICD-10-CM | POA: Diagnosis not present

## 2018-07-21 DIAGNOSIS — G4719 Other hypersomnia: Secondary | ICD-10-CM | POA: Diagnosis not present

## 2018-07-21 DIAGNOSIS — Z6841 Body Mass Index (BMI) 40.0 and over, adult: Secondary | ICD-10-CM | POA: Diagnosis not present

## 2018-08-16 DIAGNOSIS — G4710 Hypersomnia, unspecified: Secondary | ICD-10-CM | POA: Diagnosis not present

## 2018-08-17 DIAGNOSIS — G4733 Obstructive sleep apnea (adult) (pediatric): Secondary | ICD-10-CM | POA: Diagnosis not present

## 2018-08-17 DIAGNOSIS — R0683 Snoring: Secondary | ICD-10-CM | POA: Diagnosis not present

## 2018-08-17 DIAGNOSIS — Z9989 Dependence on other enabling machines and devices: Secondary | ICD-10-CM | POA: Diagnosis not present

## 2018-08-17 DIAGNOSIS — G4710 Hypersomnia, unspecified: Secondary | ICD-10-CM | POA: Diagnosis not present

## 2018-09-05 DIAGNOSIS — G4733 Obstructive sleep apnea (adult) (pediatric): Secondary | ICD-10-CM | POA: Diagnosis not present

## 2018-10-05 DIAGNOSIS — G4733 Obstructive sleep apnea (adult) (pediatric): Secondary | ICD-10-CM | POA: Diagnosis not present

## 2018-10-18 DIAGNOSIS — H18231 Secondary corneal edema, right eye: Secondary | ICD-10-CM | POA: Diagnosis not present

## 2018-10-18 DIAGNOSIS — H527 Unspecified disorder of refraction: Secondary | ICD-10-CM | POA: Diagnosis not present

## 2018-10-24 DIAGNOSIS — Z6841 Body Mass Index (BMI) 40.0 and over, adult: Secondary | ICD-10-CM | POA: Diagnosis not present

## 2018-10-24 DIAGNOSIS — R7303 Prediabetes: Secondary | ICD-10-CM | POA: Diagnosis not present

## 2018-10-24 DIAGNOSIS — Z713 Dietary counseling and surveillance: Secondary | ICD-10-CM | POA: Diagnosis not present

## 2018-11-05 DIAGNOSIS — G4733 Obstructive sleep apnea (adult) (pediatric): Secondary | ICD-10-CM | POA: Diagnosis not present

## 2018-11-07 DIAGNOSIS — R7989 Other specified abnormal findings of blood chemistry: Secondary | ICD-10-CM | POA: Diagnosis not present

## 2018-11-07 DIAGNOSIS — K219 Gastro-esophageal reflux disease without esophagitis: Secondary | ICD-10-CM | POA: Diagnosis not present

## 2018-11-07 DIAGNOSIS — Z87891 Personal history of nicotine dependence: Secondary | ICD-10-CM | POA: Diagnosis not present

## 2018-11-07 DIAGNOSIS — K760 Fatty (change of) liver, not elsewhere classified: Secondary | ICD-10-CM | POA: Diagnosis not present

## 2018-11-07 DIAGNOSIS — Z86711 Personal history of pulmonary embolism: Secondary | ICD-10-CM | POA: Diagnosis not present

## 2018-11-07 DIAGNOSIS — R7303 Prediabetes: Secondary | ICD-10-CM | POA: Diagnosis not present

## 2018-11-07 DIAGNOSIS — E7849 Other hyperlipidemia: Secondary | ICD-10-CM | POA: Diagnosis not present

## 2018-11-07 DIAGNOSIS — D7589 Other specified diseases of blood and blood-forming organs: Secondary | ICD-10-CM | POA: Diagnosis not present

## 2018-11-07 DIAGNOSIS — D6859 Other primary thrombophilia: Secondary | ICD-10-CM | POA: Diagnosis not present

## 2018-11-07 DIAGNOSIS — Z9989 Dependence on other enabling machines and devices: Secondary | ICD-10-CM | POA: Diagnosis not present

## 2018-11-07 DIAGNOSIS — G4733 Obstructive sleep apnea (adult) (pediatric): Secondary | ICD-10-CM | POA: Diagnosis not present

## 2018-11-20 DIAGNOSIS — K449 Diaphragmatic hernia without obstruction or gangrene: Secondary | ICD-10-CM | POA: Diagnosis not present

## 2018-11-22 DIAGNOSIS — M7121 Synovial cyst of popliteal space [Baker], right knee: Secondary | ICD-10-CM | POA: Insufficient documentation

## 2018-11-22 DIAGNOSIS — D7589 Other specified diseases of blood and blood-forming organs: Secondary | ICD-10-CM | POA: Diagnosis not present

## 2018-11-22 DIAGNOSIS — K760 Fatty (change of) liver, not elsewhere classified: Secondary | ICD-10-CM | POA: Diagnosis not present

## 2018-11-22 DIAGNOSIS — Z79899 Other long term (current) drug therapy: Secondary | ICD-10-CM | POA: Diagnosis not present

## 2018-11-22 DIAGNOSIS — D6859 Other primary thrombophilia: Secondary | ICD-10-CM | POA: Diagnosis not present

## 2018-11-22 DIAGNOSIS — Z6841 Body Mass Index (BMI) 40.0 and over, adult: Secondary | ICD-10-CM | POA: Diagnosis not present

## 2018-11-22 DIAGNOSIS — Z86711 Personal history of pulmonary embolism: Secondary | ICD-10-CM | POA: Diagnosis not present

## 2018-11-22 DIAGNOSIS — R7303 Prediabetes: Secondary | ICD-10-CM | POA: Diagnosis not present

## 2018-11-22 DIAGNOSIS — R7989 Other specified abnormal findings of blood chemistry: Secondary | ICD-10-CM | POA: Diagnosis not present

## 2018-11-27 DIAGNOSIS — K760 Fatty (change of) liver, not elsewhere classified: Secondary | ICD-10-CM | POA: Diagnosis not present

## 2018-11-27 DIAGNOSIS — R7989 Other specified abnormal findings of blood chemistry: Secondary | ICD-10-CM | POA: Diagnosis not present

## 2018-11-27 DIAGNOSIS — N281 Cyst of kidney, acquired: Secondary | ICD-10-CM | POA: Diagnosis not present

## 2018-11-27 DIAGNOSIS — R160 Hepatomegaly, not elsewhere classified: Secondary | ICD-10-CM | POA: Diagnosis not present

## 2018-11-27 DIAGNOSIS — Z9049 Acquired absence of other specified parts of digestive tract: Secondary | ICD-10-CM | POA: Diagnosis not present

## 2018-11-29 DIAGNOSIS — R7303 Prediabetes: Secondary | ICD-10-CM | POA: Diagnosis not present

## 2018-11-29 DIAGNOSIS — D6859 Other primary thrombophilia: Secondary | ICD-10-CM | POA: Diagnosis not present

## 2018-11-29 DIAGNOSIS — D7589 Other specified diseases of blood and blood-forming organs: Secondary | ICD-10-CM | POA: Diagnosis not present

## 2018-11-29 DIAGNOSIS — Z86711 Personal history of pulmonary embolism: Secondary | ICD-10-CM | POA: Diagnosis not present

## 2018-11-29 DIAGNOSIS — Z6841 Body Mass Index (BMI) 40.0 and over, adult: Secondary | ICD-10-CM | POA: Diagnosis not present

## 2018-11-29 DIAGNOSIS — K760 Fatty (change of) liver, not elsewhere classified: Secondary | ICD-10-CM | POA: Diagnosis not present

## 2018-11-29 DIAGNOSIS — Z79899 Other long term (current) drug therapy: Secondary | ICD-10-CM | POA: Diagnosis not present

## 2018-11-29 DIAGNOSIS — R7989 Other specified abnormal findings of blood chemistry: Secondary | ICD-10-CM | POA: Diagnosis not present

## 2018-12-01 DIAGNOSIS — Z86711 Personal history of pulmonary embolism: Secondary | ICD-10-CM | POA: Diagnosis not present

## 2018-12-01 DIAGNOSIS — D6859 Other primary thrombophilia: Secondary | ICD-10-CM | POA: Diagnosis not present

## 2018-12-01 DIAGNOSIS — Z79899 Other long term (current) drug therapy: Secondary | ICD-10-CM | POA: Diagnosis not present

## 2018-12-01 DIAGNOSIS — Z6841 Body Mass Index (BMI) 40.0 and over, adult: Secondary | ICD-10-CM | POA: Diagnosis not present

## 2018-12-01 DIAGNOSIS — R7989 Other specified abnormal findings of blood chemistry: Secondary | ICD-10-CM | POA: Diagnosis not present

## 2018-12-01 DIAGNOSIS — R7303 Prediabetes: Secondary | ICD-10-CM | POA: Diagnosis not present

## 2018-12-01 DIAGNOSIS — K760 Fatty (change of) liver, not elsewhere classified: Secondary | ICD-10-CM | POA: Diagnosis not present

## 2018-12-05 DIAGNOSIS — G4733 Obstructive sleep apnea (adult) (pediatric): Secondary | ICD-10-CM | POA: Diagnosis not present

## 2018-12-07 DIAGNOSIS — Z136 Encounter for screening for cardiovascular disorders: Secondary | ICD-10-CM | POA: Diagnosis not present

## 2018-12-07 DIAGNOSIS — K760 Fatty (change of) liver, not elsewhere classified: Secondary | ICD-10-CM | POA: Diagnosis not present

## 2018-12-07 DIAGNOSIS — Z87891 Personal history of nicotine dependence: Secondary | ICD-10-CM | POA: Diagnosis not present

## 2018-12-07 DIAGNOSIS — R7989 Other specified abnormal findings of blood chemistry: Secondary | ICD-10-CM | POA: Diagnosis not present

## 2018-12-07 DIAGNOSIS — E7849 Other hyperlipidemia: Secondary | ICD-10-CM | POA: Diagnosis not present

## 2018-12-07 DIAGNOSIS — F17210 Nicotine dependence, cigarettes, uncomplicated: Secondary | ICD-10-CM | POA: Diagnosis not present

## 2018-12-07 DIAGNOSIS — K219 Gastro-esophageal reflux disease without esophagitis: Secondary | ICD-10-CM | POA: Diagnosis not present

## 2018-12-07 DIAGNOSIS — R7303 Prediabetes: Secondary | ICD-10-CM | POA: Diagnosis not present

## 2018-12-07 DIAGNOSIS — Z6841 Body Mass Index (BMI) 40.0 and over, adult: Secondary | ICD-10-CM | POA: Diagnosis not present

## 2018-12-07 DIAGNOSIS — D6859 Other primary thrombophilia: Secondary | ICD-10-CM | POA: Diagnosis not present

## 2018-12-07 DIAGNOSIS — Z01818 Encounter for other preprocedural examination: Secondary | ICD-10-CM | POA: Diagnosis not present

## 2018-12-07 DIAGNOSIS — G4733 Obstructive sleep apnea (adult) (pediatric): Secondary | ICD-10-CM | POA: Diagnosis not present

## 2018-12-07 DIAGNOSIS — Z86711 Personal history of pulmonary embolism: Secondary | ICD-10-CM | POA: Diagnosis not present

## 2018-12-07 DIAGNOSIS — E785 Hyperlipidemia, unspecified: Secondary | ICD-10-CM | POA: Diagnosis not present

## 2018-12-07 LAB — HEMOGLOBIN A1C: Hemoglobin A1C: 7.3

## 2018-12-15 ENCOUNTER — Ambulatory Visit (INDEPENDENT_AMBULATORY_CARE_PROVIDER_SITE_OTHER): Payer: Medicare HMO | Admitting: Physician Assistant

## 2018-12-15 ENCOUNTER — Encounter: Payer: Self-pay | Admitting: Physician Assistant

## 2018-12-15 ENCOUNTER — Other Ambulatory Visit: Payer: Self-pay

## 2018-12-15 VITALS — BP 141/81 | HR 72 | Ht 59.0 in | Wt 218.0 lb

## 2018-12-15 DIAGNOSIS — E1165 Type 2 diabetes mellitus with hyperglycemia: Secondary | ICD-10-CM | POA: Diagnosis not present

## 2018-12-15 MED ORDER — OZEMPIC (0.25 OR 0.5 MG/DOSE) 2 MG/1.5ML ~~LOC~~ SOPN: 0.5000 mg | PEN_INJECTOR | SUBCUTANEOUS | 2 refills | Status: DC

## 2018-12-15 NOTE — Progress Notes (Signed)
Subjective:    Patient ID: Nicole Giles, female    DOB: August 20, 1945, 73 y.o.   MRN: [id number]  HPI  Pt is a 73 yo obese female with OSA, fatty liver disease, pre-diabetes who presents to the clinic to follow up on a1c. In November 2019 a1c was 6.3. we started metformin but she stopped it. 1 week ago a1c checked at [facility] and was 7.3. she is concerned now.   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    .. Active Ambulatory Problems    Diagnosis Date Noted  . HYPERLIPIDEMIA 08/28/2007  . Morbid obesity ([place]) 11/17/2006  . HEADACHE, TENSION 10/12/2006  . OBSTRUCTIVE SLEEP APNEA 09/21/2007  . VERTIGO, BENIGN PAROXYSMAL POSITION 02/15/2007  . 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 GERD 08/22/2008  . FATTY LIVER DISEASE 09/04/2008  . UNSPECIFIED DISORDER OF KIDNEY AND URETER 09/02/2008  . ARTHRITIS, KNEES, BILATERAL 06/01/2007  . CERVICALGIA 06/13/2008  . Pain in limb 11/13/2007  . DIZZINESS 08/22/2008  . FATIGUE 12/08/2006  . DEPENDENT EDEMA, LEGS 08/25/2007  . DYSPNEA 08/22/2008  . CHEST PAIN 06/30/2007  . FREQUENCY, URINARY 09/09/2008  . TRANSAMINASES, SERUM, ELEVATED 08/27/2008  . INJURY OTHER AND UNSPECIFIED FINGER 10/04/2008  . Hyperlipemia 12/17/2013  . Arthralgia 12/17/2013  . Body aches 12/17/2013  . Elevated fasting glucose 01/16/2014  . Pre-diabetes 01/23/2014  . Primary osteoarthritis of right knee 08/15/2014  . Peripheral edema 12/31/2015  . Elevated blood pressure 12/31/2015  . Polymyalgia ([place]) 08/11/2016  . No energy 08/19/2016  . Myalgia 08/19/2016  . Cough 08/19/2016  . 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 Fatigue 09/19/2016  . Elevated C-reactive protein (CRP) 09/19/2016  . Acute pain of left shoulder 09/19/2016  . Pain of left calf 01/16/2017  . 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  Diverticulosis 05/12/2018  . Internal hemorrhoids 05/12/2018  . Colon polyp 05/12/2018  . Controlled type 2 diabetes mellitus with hyperglycemia, without long-term current use of insulin ([place]) 12/15/2018   Resolved Ambulatory Problems    Diagnosis Date Noted  . INFLUENZA DUE TO ID NOVEL H1N1 INFLUENZA VIRUS 03/20/2008  . UTI 11/29/2008  . Pyelonephritis 08/19/2016   Past Medical History:  Diagnosis Date  . GERD (gastroesophageal reflux disease)   . H/O blood clots         Review of Systems  All other systems reviewed and are negative.      Objective:   Physical Exam Vitals signs reviewed.  Constitutional:      Appearance: Normal appearance. She is obese.  Cardiovascular:     Rate and Rhythm: Normal rate and regular rhythm.  Pulmonary:     Effort: Pulmonary effort is normal.     Breath sounds: Normal breath sounds.  Neurological:     General: No focal deficit present.     Mental Status: She is alert and oriented to person, place, and time.  Psychiatric:        Mood and Affect: Mood normal.        Behavior: Behavior normal.           Assessment & Plan:  Marland KitchenMarland KitchenAvyonna was seen today for diabetes.  Diagnoses and all orders for this visit:  Controlled type 2 diabetes mellitus with hyperglycemia, without long-term current use of insulin ([facility]) -     Semaglutide,0.25 or 0.5MG /DOS, (OZEMPIC, 0.25 OR 0.5 MG/DOSE,) 2 MG/1.5ML SOPN; Inject  0.5 mg into the skin once a week.  Morbid obesity ([place]) -     Semaglutide,0.25 or 0.5MG /DOS, (OZEMPIC, 0.25 OR 0.5 MG/DOSE,) 2 MG/1.5ML SOPN; Inject 0.5 mg into the skin once a week.   Results for orders placed or performed in visit on 05/18/18  HM COLONOSCOPY  Result Value Ref Range   [facility] Colonoscopy See Report (in chart) See Report (in chart), Patient Reported   A!C not controlled.  Pt is hesitant about metformin she feels like she didn't feel well when tried before.  Added ozempic for BS control and weight loss.  Discussed how  to use and side effects.  Can not tolerate statin.  BP not to goal. Not on ace or arb.  Discussed DM diet.  Pt declined pneumonia shot.  Needs eye exam.   Continue trying to get bariatric surgery approved. It alone could resolve DM.   Follow up in 3 months.

## 2018-12-15 NOTE — Patient Instructions (Addendum)
Ozempic once weekly shot.    Diabetes Mellitus and Nutrition, Adult When you have diabetes (diabetes mellitus), it is very important to have healthy eating habits because your blood sugar (glucose) levels are greatly affected by what you eat and drink. Eating healthy foods in the appropriate amounts, at about the same times every day, can help you:  Control your blood glucose.  Lower your risk of heart disease.  Improve your blood pressure.  Reach or maintain a healthy weight. Every person with diabetes is different, and each person has different needs for a meal plan. Your health care provider may recommend that you work with a diet and nutrition specialist (dietitian) to make a meal plan that is best for you. Your meal plan may vary depending on factors such as:  The calories you need.  The medicines you take.  Your weight.  Your blood glucose, blood pressure, and cholesterol levels.  Your activity level.  Other health conditions you have, such as heart or kidney disease. How do carbohydrates affect me? Carbohydrates, also called carbs, affect your blood glucose level more than any other type of food. Eating carbs naturally raises the amount of glucose in your blood. Carb counting is a method for keeping track of how many carbs you eat. Counting carbs is important to keep your blood glucose at a healthy level, especially if you use insulin or take certain oral diabetes medicines. It is important to know how many carbs you can safely have in each meal. This is different for every person. Your dietitian can help you calculate how many carbs you should have at each meal and for each snack. Foods that contain carbs include:  Bread, cereal, rice, pasta, and crackers.  Potatoes and corn.  Peas, beans, and lentils.  Milk and yogurt.  Fruit and juice.  Desserts, such as cakes, cookies, ice cream, and candy. How does alcohol affect me? Alcohol can cause a sudden decrease in blood  glucose (hypoglycemia), especially if you use insulin or take certain oral diabetes medicines. Hypoglycemia can be a life-threatening condition. Symptoms of hypoglycemia (sleepiness, dizziness, and confusion) are similar to symptoms of having too much alcohol. If your health care provider says that alcohol is safe for you, follow these guidelines:  Limit alcohol intake to no more than 1 drink per day for nonpregnant women and 2 drinks per day for men. One drink equals 12 oz of beer, 5 oz of wine, or 1 oz of hard liquor.  Do not drink on an empty stomach.  Keep yourself hydrated with water, diet soda, or unsweetened iced tea.  Keep in mind that regular soda, juice, and other mixers may contain a lot of sugar and must be counted as carbs. What are tips for following this plan?  Reading food labels  Start by checking the serving size on the "Nutrition Facts" label of packaged foods and drinks. The amount of calories, carbs, fats, and other nutrients listed on the label is based on one serving of the item. Many items contain more than one serving per package.  Check the total grams (g) of carbs in one serving. You can calculate the number of servings of carbs in one serving by dividing the total carbs by 15. For example, if a food has 30 g of total carbs, it would be equal to 2 servings of carbs.  Check the number of grams (g) of saturated and trans fats in one serving. Choose foods that have low or no amount of  these fats.  Check the number of milligrams (mg) of salt (sodium) in one serving. Most people should limit total sodium intake to less than 2,300 mg per day.  Always check the nutrition information of foods labeled as "low-fat" or "nonfat". These foods may be higher in added sugar or refined carbs and should be avoided.  Talk to your dietitian to identify your daily goals for nutrients listed on the label. Shopping  Avoid buying canned, premade, or processed foods. These foods tend to  be high in fat, sodium, and added sugar.  Shop around the outside edge of the grocery store. This includes fresh fruits and vegetables, bulk grains, fresh meats, and fresh dairy. Cooking  Use low-heat cooking methods, such as baking, instead of high-heat cooking methods like deep frying.  Cook using healthy oils, such as olive, canola, or sunflower oil.  Avoid cooking with butter, cream, or high-fat meats. Meal planning  Eat meals and snacks regularly, preferably at the same times every day. Avoid going long periods of time without eating.  Eat foods high in fiber, such as fresh fruits, vegetables, beans, and whole grains. Talk to your dietitian about how many servings of carbs you can eat at each meal.  Eat 4-6 ounces (oz) of lean protein each day, such as lean meat, chicken, fish, eggs, or tofu. One oz of lean protein is equal to: ? 1 oz of meat, chicken, or fish. ? 1 egg. ?  cup of tofu.  Eat some foods each day that contain healthy fats, such as avocado, nuts, seeds, and fish. Lifestyle  Check your blood glucose regularly.  Exercise regularly as told by your health care provider. This may include: ? 150 minutes of moderate-intensity or vigorous-intensity exercise each week. This could be brisk walking, biking, or water aerobics. ? Stretching and doing strength exercises, such as yoga or weightlifting, at least 2 times a week.  Take medicines as told by your health care provider.  Do not use any products that contain nicotine or tobacco, such as cigarettes and e-cigarettes. If you need help quitting, ask your health care provider.  Work with a Social worker or diabetes educator to identify strategies to manage stress and any emotional and social challenges. Questions to ask a health care provider  Do I need to meet with a diabetes educator?  Do I need to meet with a dietitian?  What number can I call if I have questions?  When are the best times to check my blood glucose?  Where to find more information:  American Diabetes Association: diabetes.org  Academy of Nutrition and Dietetics: www.eatright.[employer] of Diabetes and Digestive and Kidney Diseases (NIH): [employer] Summary  A healthy meal plan will help you control your blood glucose and maintain a healthy lifestyle.  Working with a diet and nutrition specialist (dietitian) can help you make a meal plan that is best for you.  Keep in mind that carbohydrates (carbs) and alcohol have immediate effects on your blood glucose levels. It is important to count carbs and to use alcohol carefully. This information is not intended to replace advice given to you by your health care provider. Make sure you discuss any questions you have with your health care provider. Document Released: 01/21/2005 Document Revised: 04/08/2017 Document Reviewed: 05/31/2016 Elsevier Patient Education  2020 [employer].

## 2018-12-18 ENCOUNTER — Encounter: Payer: Self-pay | Admitting: Physician Assistant

## 2018-12-18 ENCOUNTER — Telehealth: Payer: Self-pay | Admitting: Physician Assistant

## 2018-12-18 DIAGNOSIS — D7589 Other specified diseases of blood and blood-forming organs: Secondary | ICD-10-CM | POA: Diagnosis not present

## 2018-12-18 DIAGNOSIS — R945 Abnormal results of liver function studies: Secondary | ICD-10-CM | POA: Diagnosis not present

## 2018-12-18 DIAGNOSIS — K760 Fatty (change of) liver, not elsewhere classified: Secondary | ICD-10-CM | POA: Diagnosis not present

## 2018-12-18 DIAGNOSIS — Z6841 Body Mass Index (BMI) 40.0 and over, adult: Secondary | ICD-10-CM | POA: Diagnosis not present

## 2018-12-18 DIAGNOSIS — R7989 Other specified abnormal findings of blood chemistry: Secondary | ICD-10-CM | POA: Diagnosis not present

## 2018-12-18 DIAGNOSIS — G4733 Obstructive sleep apnea (adult) (pediatric): Secondary | ICD-10-CM | POA: Diagnosis not present

## 2018-12-18 DIAGNOSIS — Z9989 Dependence on other enabling machines and devices: Secondary | ICD-10-CM | POA: Diagnosis not present

## 2018-12-18 NOTE — Telephone Encounter (Signed)
Can you please abstract a1c from [facility]where into EMR.

## 2018-12-19 ENCOUNTER — Ambulatory Visit (INDEPENDENT_AMBULATORY_CARE_PROVIDER_SITE_OTHER): Payer: Medicare HMO | Admitting: Family Medicine

## 2018-12-19 ENCOUNTER — Other Ambulatory Visit: Payer: Self-pay

## 2018-12-19 DIAGNOSIS — E1165 Type 2 diabetes mellitus with hyperglycemia: Secondary | ICD-10-CM

## 2018-12-19 MED ORDER — ONETOUCH ULTRASOFT LANCETS MISC: 12 refills | Status: DC

## 2018-12-19 MED ORDER — ONETOUCH VERIO VI STRP: ORAL_STRIP | 12 refills | Status: DC

## 2018-12-19 NOTE — Progress Notes (Signed)
Established Patient Office Visit  Subjective:  Patient ID: Nicole Giles, female    DOB: 14-Sep-1945  Age: 73 y.o. MRN: [id number]  CC:  Chief Complaint  Patient presents with  . Diabetes    HPI Nicole Giles presents for instruction of injecting Ozempic. Also instruction for One Touch Verio Flex glucometer.   Past Medical History:  Diagnosis Date  . GERD (gastroesophageal reflux disease)   . H/O blood clots    in left leg after left arm surgery in 05/2006 ----- off coumadin  . Morbid obesity ([place]) 11/17/2006   Qualifier: Diagnosis of  By: Madilyn Fireman MD, Barnetta Chapel    . OBSTRUCTIVE SLEEP APNEA 09/21/2007   Qualifier: Diagnosis of  By: Madilyn Fireman MD, Barnetta Chapel    . Polymyalgia ([place]) 08/11/2016    Past Surgical History:  Procedure Laterality Date  . CHOLECYSTECTOMY    . CYSTECTOMY    . left arm fracture    . NECK SURGERY     cyst removal  . right knee surgery      Family History  Problem Relation Age of Onset  . Prostate cancer Other   . Diabetes Other   . Diabetes Brother     Social History   Socioeconomic History  . Marital status: Single    Spouse name: Not on file  . Number of children: Not on file  . Years of education: Not on file  . Highest education level: Not on file  Occupational History  . Not on file  Social Needs  . Financial resource strain: Not on file  . Food insecurity    Worry: Not on file    Inability: Not on file  . Transportation needs    Medical: Not on file    Non-medical: Not on file  Tobacco Use  . Smoking status: Former Research scientist (life sciences)  . Smokeless tobacco: Current User  Substance and Sexual Activity  . Alcohol use: No  . Drug use: No  . Sexual activity: Never  Lifestyle  . Physical activity    Days per week: Not on file    Minutes per session: Not on file  . Stress: Not on file  Relationships  . Social Herbalist on phone: Not on file    Gets together: Not on file    Attends religious service: Not on file    Active  member of club or organization: Not on file    Attends meetings of clubs or organizations: Not on file    Relationship status: Not on file  . Intimate partner violence    Fear of current or ex partner: Not on file    Emotionally abused: Not on file    Physically abused: Not on file    Forced sexual activity: Not on file  Other Topics Concern  . Not on file  Social History Narrative  . Not on file    Outpatient Medications Prior to Visit  Medication Sig Dispense Refill  . diclofenac sodium (VOLTAREN) 1 % GEL Apply 4 g topically 4 (four) times daily. To affected joint. 100 g 11  . esomeprazole (NEXIUM) 40 MG capsule Take 1 capsule (40 mg total) by mouth daily at 12 noon. 30 capsule 2  . ibuprofen (ADVIL,MOTRIN) 800 MG tablet Take 1 tablet (800 mg total) by mouth every 8 (eight) hours as needed. 60 tablet 0  . Semaglutide,0.25 or 0.5MG /DOS, (OZEMPIC, 0.25 OR 0.5 MG/DOSE,) 2 MG/1.5ML SOPN Inject 0.5 mg into the skin once a week. 1  pen 2   No facility-administered medications prior to visit.     Allergies  Allergen Reactions  . Codeine   . Lipitor [Atorvastatin Calcium]     myaglia  . Meperidine Hcl     ROS Review of Systems    Objective:    Physical Exam  There were no vitals taken for this visit. Wt Readings from Last 3 Encounters:  12/15/18 218 lb (98.9 kg)  04/04/18 220 lb (99.8 kg)  03/13/18 225 lb (102.1 kg)     Health Maintenance Due  Topic Date Due  . OPHTHALMOLOGY EXAM  12/27/1957  . URINE MICROALBUMIN  12/27/1957  . HEMOGLOBIN A1C  10/03/2018  . INFLUENZA VACCINE  12/09/2018    There are no preventive care reminders to display for this patient.  Lab Results  Component Value Date   TSH 3.34 09/16/2016   Lab Results  Component Value Date   WBC 6.9 02/04/2017   HGB 13.6 02/04/2017   HCT 39.4 02/04/2017   MCV 97.5 02/04/2017   PLT 287 02/04/2017   Lab Results  Component Value Date   NA 138 04/04/2018   K 4.5 04/04/2018   CO2 26 04/04/2018    GLUCOSE 125 (H) 04/04/2018   BUN 13 04/04/2018   CREATININE 0.63 04/04/2018   BILITOT 0.8 04/04/2018   ALKPHOS 100 08/09/2016   AST 53 (H) 04/04/2018   ALT 49 (H) 04/04/2018   PROT 6.5 04/04/2018   ALBUMIN 3.8 08/09/2016   CALCIUM 9.7 04/04/2018   Lab Results  Component Value Date   CHOL 210 (H) 04/04/2018   Lab Results  Component Value Date   HDL 37 (L) 04/04/2018   Lab Results  Component Value Date   LDLCALC 139 (H) 04/04/2018   Lab Results  Component Value Date   TRIG 199 (H) 04/04/2018   Lab Results  Component Value Date   CHOLHDL 5.7 (H) 04/04/2018   Lab Results  Component Value Date   HGBA1C 6.3 (H) 04/04/2018      Assessment & Plan:  DM - Education - instructed on proper procedure of injecting Ozempic. Instructed patient on proper procedure for using the OneTouch Verio glucometer. Gave DM diet plan and instructions for foot care and eating out.    Problem List Items Addressed This Visit    Controlled type 2 diabetes mellitus with hyperglycemia, without long-term current use of insulin ([place]) - Primary   Relevant Medications   glucose blood (ONETOUCH VERIO) test strip   Lancets (ONETOUCH ULTRASOFT) lancets      Meds ordered this encounter  Medications  . glucose blood (ONETOUCH VERIO) test strip    Sig: Dx DM E11.65. Check fasting blood sugar every morning.    Dispense:  100 each    Refill:  12  . Lancets (ONETOUCH ULTRASOFT) lancets    Sig: Dx DM E11.65. Check fasting blood sugar every morning.    Dispense:  100 each    Refill:  12    Follow-up: No follow-ups on file.    Lavell Luster, [place]

## 2018-12-19 NOTE — Progress Notes (Signed)
Agree with documentation as above.  Time spent by nursing staff 10 minutes to educate on how to properly use the glucometer.  Beatrice Lecher, MD

## 2018-12-21 NOTE — Telephone Encounter (Signed)
Abstracted

## 2018-12-25 DIAGNOSIS — D7589 Other specified diseases of blood and blood-forming organs: Secondary | ICD-10-CM | POA: Diagnosis not present

## 2018-12-25 DIAGNOSIS — Z6841 Body Mass Index (BMI) 40.0 and over, adult: Secondary | ICD-10-CM | POA: Diagnosis not present

## 2018-12-25 DIAGNOSIS — R945 Abnormal results of liver function studies: Secondary | ICD-10-CM | POA: Diagnosis not present

## 2018-12-25 DIAGNOSIS — K760 Fatty (change of) liver, not elsewhere classified: Secondary | ICD-10-CM | POA: Diagnosis not present

## 2018-12-25 DIAGNOSIS — G4733 Obstructive sleep apnea (adult) (pediatric): Secondary | ICD-10-CM | POA: Diagnosis not present

## 2018-12-25 DIAGNOSIS — R7989 Other specified abnormal findings of blood chemistry: Secondary | ICD-10-CM | POA: Diagnosis not present

## 2018-12-25 DIAGNOSIS — Z9989 Dependence on other enabling machines and devices: Secondary | ICD-10-CM | POA: Diagnosis not present

## 2019-01-01 DIAGNOSIS — K760 Fatty (change of) liver, not elsewhere classified: Secondary | ICD-10-CM | POA: Diagnosis not present

## 2019-01-01 DIAGNOSIS — D7589 Other specified diseases of blood and blood-forming organs: Secondary | ICD-10-CM | POA: Diagnosis not present

## 2019-01-01 DIAGNOSIS — Z6841 Body Mass Index (BMI) 40.0 and over, adult: Secondary | ICD-10-CM | POA: Diagnosis not present

## 2019-01-01 DIAGNOSIS — R7989 Other specified abnormal findings of blood chemistry: Secondary | ICD-10-CM | POA: Diagnosis not present

## 2019-01-01 DIAGNOSIS — G4733 Obstructive sleep apnea (adult) (pediatric): Secondary | ICD-10-CM | POA: Diagnosis not present

## 2019-01-01 DIAGNOSIS — Z9989 Dependence on other enabling machines and devices: Secondary | ICD-10-CM | POA: Diagnosis not present

## 2019-01-01 DIAGNOSIS — R945 Abnormal results of liver function studies: Secondary | ICD-10-CM | POA: Diagnosis not present

## 2019-01-04 DIAGNOSIS — D7589 Other specified diseases of blood and blood-forming organs: Secondary | ICD-10-CM | POA: Diagnosis not present

## 2019-01-04 DIAGNOSIS — K760 Fatty (change of) liver, not elsewhere classified: Secondary | ICD-10-CM | POA: Diagnosis not present

## 2019-01-04 DIAGNOSIS — G4733 Obstructive sleep apnea (adult) (pediatric): Secondary | ICD-10-CM | POA: Diagnosis not present

## 2019-01-04 DIAGNOSIS — R7989 Other specified abnormal findings of blood chemistry: Secondary | ICD-10-CM | POA: Diagnosis not present

## 2019-01-04 DIAGNOSIS — Z6841 Body Mass Index (BMI) 40.0 and over, adult: Secondary | ICD-10-CM | POA: Diagnosis not present

## 2019-01-04 DIAGNOSIS — R945 Abnormal results of liver function studies: Secondary | ICD-10-CM | POA: Diagnosis not present

## 2019-01-04 DIAGNOSIS — Z9989 Dependence on other enabling machines and devices: Secondary | ICD-10-CM | POA: Diagnosis not present

## 2019-01-05 DIAGNOSIS — G4733 Obstructive sleep apnea (adult) (pediatric): Secondary | ICD-10-CM | POA: Diagnosis not present

## 2019-01-09 DIAGNOSIS — Z6841 Body Mass Index (BMI) 40.0 and over, adult: Secondary | ICD-10-CM | POA: Diagnosis not present

## 2019-01-09 DIAGNOSIS — Z86711 Personal history of pulmonary embolism: Secondary | ICD-10-CM | POA: Diagnosis not present

## 2019-01-09 DIAGNOSIS — R7989 Other specified abnormal findings of blood chemistry: Secondary | ICD-10-CM | POA: Diagnosis not present

## 2019-01-09 DIAGNOSIS — D7589 Other specified diseases of blood and blood-forming organs: Secondary | ICD-10-CM | POA: Diagnosis not present

## 2019-01-09 DIAGNOSIS — D6859 Other primary thrombophilia: Secondary | ICD-10-CM | POA: Diagnosis not present

## 2019-01-09 DIAGNOSIS — K760 Fatty (change of) liver, not elsewhere classified: Secondary | ICD-10-CM | POA: Diagnosis not present

## 2019-01-09 DIAGNOSIS — G4733 Obstructive sleep apnea (adult) (pediatric): Secondary | ICD-10-CM | POA: Diagnosis not present

## 2019-01-17 DIAGNOSIS — G4733 Obstructive sleep apnea (adult) (pediatric): Secondary | ICD-10-CM | POA: Diagnosis not present

## 2019-01-17 DIAGNOSIS — Z6841 Body Mass Index (BMI) 40.0 and over, adult: Secondary | ICD-10-CM | POA: Diagnosis not present

## 2019-01-17 DIAGNOSIS — D6859 Other primary thrombophilia: Secondary | ICD-10-CM | POA: Diagnosis not present

## 2019-01-17 DIAGNOSIS — K760 Fatty (change of) liver, not elsewhere classified: Secondary | ICD-10-CM | POA: Diagnosis not present

## 2019-01-17 DIAGNOSIS — Z86711 Personal history of pulmonary embolism: Secondary | ICD-10-CM | POA: Diagnosis not present

## 2019-01-17 DIAGNOSIS — R7989 Other specified abnormal findings of blood chemistry: Secondary | ICD-10-CM | POA: Diagnosis not present

## 2019-01-17 DIAGNOSIS — D7589 Other specified diseases of blood and blood-forming organs: Secondary | ICD-10-CM | POA: Diagnosis not present

## 2019-01-22 DIAGNOSIS — Z86711 Personal history of pulmonary embolism: Secondary | ICD-10-CM | POA: Diagnosis not present

## 2019-01-22 DIAGNOSIS — K760 Fatty (change of) liver, not elsewhere classified: Secondary | ICD-10-CM | POA: Diagnosis not present

## 2019-01-22 DIAGNOSIS — D7589 Other specified diseases of blood and blood-forming organs: Secondary | ICD-10-CM | POA: Diagnosis not present

## 2019-01-22 DIAGNOSIS — D6859 Other primary thrombophilia: Secondary | ICD-10-CM | POA: Diagnosis not present

## 2019-01-22 DIAGNOSIS — R7989 Other specified abnormal findings of blood chemistry: Secondary | ICD-10-CM | POA: Diagnosis not present

## 2019-01-22 DIAGNOSIS — Z6841 Body Mass Index (BMI) 40.0 and over, adult: Secondary | ICD-10-CM | POA: Diagnosis not present

## 2019-01-22 DIAGNOSIS — G4733 Obstructive sleep apnea (adult) (pediatric): Secondary | ICD-10-CM | POA: Diagnosis not present

## 2019-01-29 DIAGNOSIS — R7989 Other specified abnormal findings of blood chemistry: Secondary | ICD-10-CM | POA: Diagnosis not present

## 2019-01-29 DIAGNOSIS — G4733 Obstructive sleep apnea (adult) (pediatric): Secondary | ICD-10-CM | POA: Diagnosis not present

## 2019-01-29 DIAGNOSIS — Z86711 Personal history of pulmonary embolism: Secondary | ICD-10-CM | POA: Diagnosis not present

## 2019-01-29 DIAGNOSIS — D6859 Other primary thrombophilia: Secondary | ICD-10-CM | POA: Diagnosis not present

## 2019-01-29 DIAGNOSIS — K760 Fatty (change of) liver, not elsewhere classified: Secondary | ICD-10-CM | POA: Diagnosis not present

## 2019-01-29 DIAGNOSIS — D7589 Other specified diseases of blood and blood-forming organs: Secondary | ICD-10-CM | POA: Diagnosis not present

## 2019-01-29 DIAGNOSIS — Z6841 Body Mass Index (BMI) 40.0 and over, adult: Secondary | ICD-10-CM | POA: Diagnosis not present

## 2019-02-05 DIAGNOSIS — G4733 Obstructive sleep apnea (adult) (pediatric): Secondary | ICD-10-CM | POA: Diagnosis not present

## 2019-02-07 DIAGNOSIS — Z6841 Body Mass Index (BMI) 40.0 and over, adult: Secondary | ICD-10-CM | POA: Diagnosis not present

## 2019-02-07 DIAGNOSIS — D6859 Other primary thrombophilia: Secondary | ICD-10-CM | POA: Diagnosis not present

## 2019-02-07 DIAGNOSIS — K760 Fatty (change of) liver, not elsewhere classified: Secondary | ICD-10-CM | POA: Diagnosis not present

## 2019-02-07 DIAGNOSIS — D7589 Other specified diseases of blood and blood-forming organs: Secondary | ICD-10-CM | POA: Diagnosis not present

## 2019-02-07 DIAGNOSIS — Z9989 Dependence on other enabling machines and devices: Secondary | ICD-10-CM | POA: Diagnosis not present

## 2019-02-07 DIAGNOSIS — Z86711 Personal history of pulmonary embolism: Secondary | ICD-10-CM | POA: Diagnosis not present

## 2019-02-07 DIAGNOSIS — G4733 Obstructive sleep apnea (adult) (pediatric): Secondary | ICD-10-CM | POA: Diagnosis not present

## 2019-02-07 DIAGNOSIS — R7989 Other specified abnormal findings of blood chemistry: Secondary | ICD-10-CM | POA: Diagnosis not present

## 2019-02-12 DIAGNOSIS — R7989 Other specified abnormal findings of blood chemistry: Secondary | ICD-10-CM | POA: Diagnosis not present

## 2019-02-19 DIAGNOSIS — R7989 Other specified abnormal findings of blood chemistry: Secondary | ICD-10-CM | POA: Diagnosis not present

## 2019-02-19 DIAGNOSIS — Z79899 Other long term (current) drug therapy: Secondary | ICD-10-CM | POA: Diagnosis not present

## 2019-03-05 DIAGNOSIS — R7989 Other specified abnormal findings of blood chemistry: Secondary | ICD-10-CM | POA: Diagnosis not present

## 2019-03-07 DIAGNOSIS — G4733 Obstructive sleep apnea (adult) (pediatric): Secondary | ICD-10-CM | POA: Diagnosis not present

## 2019-03-12 ENCOUNTER — Other Ambulatory Visit: Payer: Self-pay | Admitting: Physician Assistant

## 2019-03-12 DIAGNOSIS — E1165 Type 2 diabetes mellitus with hyperglycemia: Secondary | ICD-10-CM

## 2019-03-12 DIAGNOSIS — R7989 Other specified abnormal findings of blood chemistry: Secondary | ICD-10-CM | POA: Diagnosis not present

## 2019-03-19 DIAGNOSIS — R945 Abnormal results of liver function studies: Secondary | ICD-10-CM | POA: Diagnosis not present

## 2019-03-19 DIAGNOSIS — R7989 Other specified abnormal findings of blood chemistry: Secondary | ICD-10-CM | POA: Diagnosis not present

## 2019-03-19 DIAGNOSIS — K760 Fatty (change of) liver, not elsewhere classified: Secondary | ICD-10-CM | POA: Diagnosis not present

## 2019-03-19 DIAGNOSIS — Z86711 Personal history of pulmonary embolism: Secondary | ICD-10-CM | POA: Diagnosis not present

## 2019-03-26 ENCOUNTER — Ambulatory Visit (INDEPENDENT_AMBULATORY_CARE_PROVIDER_SITE_OTHER): Payer: Medicare HMO | Admitting: Physician Assistant

## 2019-03-26 ENCOUNTER — Ambulatory Visit (INDEPENDENT_AMBULATORY_CARE_PROVIDER_SITE_OTHER): Payer: Medicare HMO

## 2019-03-26 ENCOUNTER — Encounter: Payer: Self-pay | Admitting: Physician Assistant

## 2019-03-26 ENCOUNTER — Other Ambulatory Visit: Payer: Self-pay | Admitting: Physician Assistant

## 2019-03-26 ENCOUNTER — Other Ambulatory Visit: Payer: Self-pay

## 2019-03-26 VITALS — BP 130/68 | HR 80 | Ht 59.0 in | Wt 209.0 lb

## 2019-03-26 DIAGNOSIS — R0600 Dyspnea, unspecified: Secondary | ICD-10-CM | POA: Diagnosis not present

## 2019-03-26 DIAGNOSIS — R05 Cough: Secondary | ICD-10-CM

## 2019-03-26 DIAGNOSIS — T887XXA Unspecified adverse effect of drug or medicament, initial encounter: Secondary | ICD-10-CM

## 2019-03-26 DIAGNOSIS — Z13220 Encounter for screening for lipoid disorders: Secondary | ICD-10-CM | POA: Diagnosis not present

## 2019-03-26 DIAGNOSIS — Z79899 Other long term (current) drug therapy: Secondary | ICD-10-CM | POA: Diagnosis not present

## 2019-03-26 DIAGNOSIS — L309 Dermatitis, unspecified: Secondary | ICD-10-CM

## 2019-03-26 DIAGNOSIS — R7989 Other specified abnormal findings of blood chemistry: Secondary | ICD-10-CM | POA: Diagnosis not present

## 2019-03-26 DIAGNOSIS — R0602 Shortness of breath: Secondary | ICD-10-CM | POA: Diagnosis not present

## 2019-03-26 DIAGNOSIS — E785 Hyperlipidemia, unspecified: Secondary | ICD-10-CM

## 2019-03-26 DIAGNOSIS — R110 Nausea: Secondary | ICD-10-CM | POA: Diagnosis not present

## 2019-03-26 DIAGNOSIS — E1165 Type 2 diabetes mellitus with hyperglycemia: Secondary | ICD-10-CM

## 2019-03-26 DIAGNOSIS — Z1231 Encounter for screening mammogram for malignant neoplasm of breast: Secondary | ICD-10-CM

## 2019-03-26 DIAGNOSIS — R059 Cough, unspecified: Secondary | ICD-10-CM

## 2019-03-26 LAB — POCT GLYCOSYLATED HEMOGLOBIN (HGB A1C): Hemoglobin A1C: 4.8 % (ref 4.0–5.6)

## 2019-03-26 MED ORDER — OZEMPIC (0.25 OR 0.5 MG/DOSE) 2 MG/1.5ML ~~LOC~~ SOPN: 0.2500 mg | PEN_INJECTOR | SUBCUTANEOUS | 0 refills | Status: DC

## 2019-03-26 MED ORDER — ROSUVASTATIN CALCIUM 5 MG PO TABS: 5.0000 mg | ORAL_TABLET | Freq: Every day | ORAL | 1 refills | Status: DC

## 2019-03-26 MED ORDER — TRIAMCINOLONE ACETONIDE 0.1 % EX CREA: 1.0000 "application " | TOPICAL_CREAM | Freq: Two times a day (BID) | CUTANEOUS | 0 refills | Status: DC

## 2019-03-26 MED ORDER — ESOMEPRAZOLE MAGNESIUM 40 MG PO CPDR: 40.0000 mg | DELAYED_RELEASE_CAPSULE | Freq: Every day | ORAL | 3 refills | Status: DC

## 2019-03-26 NOTE — Progress Notes (Signed)
Subjective:    Patient ID: Nicole Giles, female    DOB: 09-Aug-1945, 73 y.o.   MRN: [id number]  HPI  Pt is a 73 yo female with T2DM, OSA, hereditary hemochromotosis  .3 months ago patient's A1c was 7.3.  We discussed options for treatment.  Patient is actively been working on weight loss so we decided to start with a GLP-1.  We started with Ozempic.  She is taking 0.5 mg weekly.  She does admit to having nausea and reflux symptoms since starting medication.  Seems to be worse right after she takes the injection and gets a little better.  She denies any diarrhea.  In fact she has had more constipation.  Otherwise she feels really good.  She is losing some weight which makes her happy.  This also relieves the pressure off her knees.  As far as her hemochromatosis she has been monitored by hematology and given blood every week at the hospital.  Patient does have a dry scaly circular rash on her right shoulder that comes and goes.  Right now she is doing is treating with alcohol when he gets itchy and occasionally putting some moisturizers on there.  Patient is also having formed shortness of breath with exertion.  She has noticed this for at least 6 months.  She denies any swelling or orthopnea.  She denies any chest pains or palpitations.  She does not have any persistent cough.  She does not have a history of asthma or COPD.  She has never smoked.  She denies any wheezing.  She denies any fever, chills, upper respiratory symptoms.  .. Active Ambulatory Problems    Diagnosis Date Noted  . HYPERLIPIDEMIA 08/28/2007  . Morbid obesity ([place]) 11/17/2006  . HEADACHE, TENSION 10/12/2006  . OBSTRUCTIVE SLEEP APNEA 09/21/2007  . VERTIGO, BENIGN PAROXYSMAL POSITION 02/15/2007  . 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 GERD 08/22/2008  . FATTY LIVER DISEASE 09/04/2008  . UNSPECIFIED DISORDER OF KIDNEY AND URETER 09/02/2008  . ARTHRITIS, KNEES, BILATERAL 06/01/2007  . CERVICALGIA 06/13/2008  . Pain in  limb 11/13/2007  . DIZZINESS 08/22/2008  . FATIGUE 12/08/2006  . DEPENDENT EDEMA, LEGS 08/25/2007  . Dyspnea 08/22/2008  . CHEST PAIN 06/30/2007  . FREQUENCY, URINARY 09/09/2008  . TRANSAMINASES, SERUM, ELEVATED 08/27/2008  . INJURY OTHER AND UNSPECIFIED FINGER 10/04/2008  . Hyperlipemia 12/17/2013  . Arthralgia 12/17/2013  . Body aches 12/17/2013  . Elevated fasting glucose 01/16/2014  . Pre-diabetes 01/23/2014  . Primary osteoarthritis of right knee 08/15/2014  . Peripheral edema 12/31/2015  . Elevated blood pressure 12/31/2015  . Polymyalgia ([place]) 08/11/2016  . No energy 08/19/2016  . Myalgia 08/19/2016  . Cough 08/19/2016  . 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 Fatigue 09/19/2016  . Elevated C-reactive protein (CRP) 09/19/2016  . Acute pain of left shoulder 09/19/2016  . Pain of left calf 01/16/2017  . 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 Diverticulosis 05/12/2018  . Internal hemorrhoids 05/12/2018  . Colon polyp 05/12/2018  . Controlled type 2 diabetes mellitus with hyperglycemia, without long-term current use of insulin ([place]) 12/15/2018  . Hereditary hemochromatosis ([place]) 04/02/2019  . Nausea 04/02/2019   Resolved Ambulatory Problems    Diagnosis Date Noted  . INFLUENZA DUE TO ID NOVEL H1N1 INFLUENZA VIRUS 03/20/2008  . UTI 11/29/2008  . Pyelonephritis 08/19/2016   Past Medical History:  Diagnosis Date  . GERD (gastroesophageal reflux disease)   . H/O blood  clots      Review of Systems See HPI.     Objective:   Physical Exam Vitals signs reviewed.  Constitutional:      Appearance: Normal appearance. She is obese.  HENT:     Head: Normocephalic.  Cardiovascular:     Rate and Rhythm: Normal rate and regular rhythm.     Pulses: Normal pulses.     Heart sounds: No murmur.  Pulmonary:     Effort: Pulmonary effort is normal.     Breath sounds: Normal breath sounds.  No wheezing or rhonchi.  Musculoskeletal:     Right lower leg: No edema.     Left lower leg: No edema.  Neurological:     General: No focal deficit present.     Mental Status: She is alert and oriented to person, place, and time.  Psychiatric:        Mood and Affect: Mood normal.        Behavior: Behavior normal.           Assessment & Plan:  Marland KitchenMarland KitchenMarabeth was seen today for diabetes.  Diagnoses and all orders for this visit:  Controlled type 2 diabetes mellitus with hyperglycemia, without long-term current use of insulin (HCC) -     POCT glycosylated hemoglobin (Hb A1C) -     triamcinolone cream (KENALOG) 0.1 %; Apply 1 application topically 2 (two) times daily. -     Semaglutide,0.25 or 0.5MG /DOS, (OZEMPIC, 0.25 OR 0.5 MG/DOSE,) 2 MG/1.5ML SOPN; Inject 0.25 mg into the skin once a week.  Screening for lipid disorders -     Lipid Panel w/reflex Direct LDL  Medication management -     COMPLETE METABOLIC PANEL WITH GFR  Morbid obesity (HCC) -     Semaglutide,0.25 or 0.5MG /DOS, (OZEMPIC, 0.25 OR 0.5 MG/DOSE,) 2 MG/1.5ML SOPN; Inject 0.25 mg into the skin once a week.  Cough -     esomeprazole (NEXIUM) 40 MG capsule; Take 1 capsule (40 mg total) by mouth daily at 12 noon. -     CXR 2 view  Dyspnea, unspecified type -     CXR 2 view -     EKG 12-Lead  Nausea  Medication side effect  Hereditary hemochromatosis (HCC)  Eczema, unspecified type -     triamcinolone cream (KENALOG) 0.1 %; Apply 1 application topically 2 (two) times daily.  Hyperlipidemia LDL goal <70 -     rosuvastatin (CRESTOR) 5 MG tablet; Take 1 tablet (5 mg total) by mouth daily.   .. Results for orders placed or performed in visit on 03/26/19  POCT glycosylated hemoglobin (Hb A1C)  Result Value Ref Range   Hemoglobin A1C 4.8 4.0 - 5.6 %   HbA1c POC (<> result, manual entry)     HbA1c, POC (prediabetic range)     HbA1c, POC (controlled diabetic range)     A!C is great.  Pt is having symptoms on  ozempic.  Decrease to .25mg  weekly dose.  BP to goal. LDL at 103 not to goal. Discussed statin benefits even if takes once or twice a week. Prior side effects to liptior. Low dost crestor sent to use lowest dose and frequency that she can tolerate.  Eye exam UTD.  Denies flu and pneumonia shot.   Rash appears like dry skin eczema. Given topical steroid to use prn.   Unclear etiology of SOB.  Could be physical deconditioning.  Question of this could be some side effects to her hemochromatosis. Normal 6 minute walk test.  EKG- NSR at 76. No ST elevation or Depression. ?left axis deviation.  CXR-normal.  No edema.  No signs of COPD/asthma. Will get echo.  Consider lung function studies.

## 2019-03-26 NOTE — Progress Notes (Signed)
Normal CXR

## 2019-03-26 NOTE — Patient Instructions (Addendum)
Decrease ozempic to .25mg  weekly.  Will order Echo.

## 2019-03-28 ENCOUNTER — Other Ambulatory Visit: Payer: Self-pay | Admitting: Physician Assistant

## 2019-03-28 DIAGNOSIS — E1165 Type 2 diabetes mellitus with hyperglycemia: Secondary | ICD-10-CM

## 2019-04-02 ENCOUNTER — Encounter (HOSPITAL_COMMUNITY): Payer: Self-pay | Admitting: Physician Assistant

## 2019-04-02 DIAGNOSIS — R945 Abnormal results of liver function studies: Secondary | ICD-10-CM | POA: Diagnosis not present

## 2019-04-02 DIAGNOSIS — Z86711 Personal history of pulmonary embolism: Secondary | ICD-10-CM | POA: Diagnosis not present

## 2019-04-02 DIAGNOSIS — R7989 Other specified abnormal findings of blood chemistry: Secondary | ICD-10-CM | POA: Diagnosis not present

## 2019-04-02 DIAGNOSIS — R110 Nausea: Secondary | ICD-10-CM | POA: Insufficient documentation

## 2019-04-02 DIAGNOSIS — K760 Fatty (change of) liver, not elsewhere classified: Secondary | ICD-10-CM | POA: Diagnosis not present

## 2019-04-04 ENCOUNTER — Telehealth: Payer: Self-pay | Admitting: Physician Assistant

## 2019-04-04 NOTE — Telephone Encounter (Signed)
Received fax for prior authorization on Verio Strips sent through cover my meds waiting on determination. - CF

## 2019-04-04 NOTE — Telephone Encounter (Signed)
Received fax from [facility] and they denied coverage on onetouch verio strip due to it does not meet requirement for medical necessity. Placing in providers box. - CF

## 2019-04-07 DIAGNOSIS — G4733 Obstructive sleep apnea (adult) (pediatric): Secondary | ICD-10-CM | POA: Diagnosis not present

## 2019-04-11 NOTE — Addendum Note (Signed)
Addended byAnnamaria Helling on: 04/11/2019 10:04 AM   Modules accepted: Orders

## 2019-04-12 ENCOUNTER — Telehealth: Payer: Self-pay | Admitting: Neurology

## 2019-04-12 ENCOUNTER — Other Ambulatory Visit (HOSPITAL_COMMUNITY): Payer: Medicare HMO

## 2019-04-12 DIAGNOSIS — E1165 Type 2 diabetes mellitus with hyperglycemia: Secondary | ICD-10-CM

## 2019-04-12 MED ORDER — TRUE METRIX METER W/DEVICE KIT: 1.0000 | PACK | Freq: Every day | 0 refills | Status: DC

## 2019-04-12 MED ORDER — GLUCOSE BLOOD VI STRP: ORAL_STRIP | 12 refills | Status: DC

## 2019-04-12 NOTE — Telephone Encounter (Signed)
Denial received for one touch verio test strips. Insurance company will pay for [employer] New RX sent to device and strips.

## 2019-04-24 DIAGNOSIS — R945 Abnormal results of liver function studies: Secondary | ICD-10-CM | POA: Diagnosis not present

## 2019-04-24 DIAGNOSIS — K760 Fatty (change of) liver, not elsewhere classified: Secondary | ICD-10-CM | POA: Diagnosis not present

## 2019-04-24 DIAGNOSIS — R7989 Other specified abnormal findings of blood chemistry: Secondary | ICD-10-CM | POA: Diagnosis not present

## 2019-04-24 DIAGNOSIS — D7589 Other specified diseases of blood and blood-forming organs: Secondary | ICD-10-CM | POA: Diagnosis not present

## 2019-04-24 DIAGNOSIS — Z6841 Body Mass Index (BMI) 40.0 and over, adult: Secondary | ICD-10-CM | POA: Diagnosis not present

## 2019-04-24 DIAGNOSIS — Z7901 Long term (current) use of anticoagulants: Secondary | ICD-10-CM | POA: Diagnosis not present

## 2019-04-24 DIAGNOSIS — Z86711 Personal history of pulmonary embolism: Secondary | ICD-10-CM | POA: Diagnosis not present

## 2019-04-26 ENCOUNTER — Ambulatory Visit: Payer: Medicare HMO

## 2019-04-26 ENCOUNTER — Other Ambulatory Visit (HOSPITAL_COMMUNITY): Payer: Medicare HMO

## 2019-05-01 DIAGNOSIS — Z7901 Long term (current) use of anticoagulants: Secondary | ICD-10-CM | POA: Diagnosis not present

## 2019-05-01 DIAGNOSIS — R519 Headache, unspecified: Secondary | ICD-10-CM | POA: Diagnosis not present

## 2019-05-01 DIAGNOSIS — D7589 Other specified diseases of blood and blood-forming organs: Secondary | ICD-10-CM | POA: Diagnosis not present

## 2019-05-01 DIAGNOSIS — R945 Abnormal results of liver function studies: Secondary | ICD-10-CM | POA: Diagnosis not present

## 2019-05-01 DIAGNOSIS — K760 Fatty (change of) liver, not elsewhere classified: Secondary | ICD-10-CM | POA: Diagnosis not present

## 2019-05-01 DIAGNOSIS — R7989 Other specified abnormal findings of blood chemistry: Secondary | ICD-10-CM | POA: Diagnosis not present

## 2019-05-01 DIAGNOSIS — D6859 Other primary thrombophilia: Secondary | ICD-10-CM | POA: Diagnosis not present

## 2019-05-01 DIAGNOSIS — Z86711 Personal history of pulmonary embolism: Secondary | ICD-10-CM | POA: Diagnosis not present

## 2019-05-01 DIAGNOSIS — Z6841 Body Mass Index (BMI) 40.0 and over, adult: Secondary | ICD-10-CM | POA: Diagnosis not present

## 2019-05-02 ENCOUNTER — Telehealth (HOSPITAL_COMMUNITY): Payer: Self-pay | Admitting: Radiology

## 2019-05-02 NOTE — Telephone Encounter (Signed)
Just an FYI. We have made several attempts to contact this patient including sending a letter to schedule or reschedule their echocardiogram. We will be removing the patient from the echo [place].   04-30-19 [facility] to call and resch echo.dp  12.17.20 no show  11.23.20 @ 10:47am lm on home vm - gesila  11.23.20 mail reminder letter Jari Sportsman   Thank you

## 2019-05-03 ENCOUNTER — Ambulatory Visit: Payer: Medicare HMO

## 2019-05-06 NOTE — Telephone Encounter (Signed)
Nicole Giles try once from our office to see why she is not answering?

## 2019-05-07 DIAGNOSIS — G4733 Obstructive sleep apnea (adult) (pediatric): Secondary | ICD-10-CM | POA: Diagnosis not present

## 2019-05-08 NOTE — Telephone Encounter (Signed)
Left message on machine for patient encouraging her to call scheduling back to set up echocardiogram.

## 2019-05-15 ENCOUNTER — Other Ambulatory Visit (HOSPITAL_COMMUNITY): Payer: Medicare HMO

## 2019-05-15 ENCOUNTER — Telehealth (HOSPITAL_COMMUNITY): Payer: Self-pay | Admitting: Radiology

## 2019-05-15 NOTE — Telephone Encounter (Signed)
Thanks for the effort.

## 2019-05-15 NOTE — Telephone Encounter (Signed)
Just an FYI. We have made several attempts to contact this patient including sending a letter to schedule or reschedule their echocardiogram. Patient has also had multiple no shows for appointments. We will be removing the patient from the echo [facility].   1.5.21 1134 No show-evd  04-30-19 [facility] to call and resch echo.dp  12.17.20 no show  11.23.20 @ 10:47am lm on home vm - gesila  11.23.20 mail reminder letter Jari Sportsman  Thank you

## 2019-05-24 ENCOUNTER — Other Ambulatory Visit: Payer: Self-pay

## 2019-05-24 ENCOUNTER — Ambulatory Visit (HOSPITAL_COMMUNITY): Payer: Medicare HMO | Attending: Cardiology

## 2019-05-24 DIAGNOSIS — R05 Cough: Secondary | ICD-10-CM | POA: Insufficient documentation

## 2019-05-24 DIAGNOSIS — R0600 Dyspnea, unspecified: Secondary | ICD-10-CM | POA: Diagnosis not present

## 2019-05-24 NOTE — Progress Notes (Signed)
Call pt: Echo looks good. Great EF of 60-65 percent. Mild calcification of mitral valve. No concerning features.

## 2019-05-28 NOTE — Progress Notes (Signed)
Yes lets get her scheduled for spirometry in office.

## 2019-06-01 ENCOUNTER — Other Ambulatory Visit: Payer: Self-pay

## 2019-06-01 ENCOUNTER — Ambulatory Visit (INDEPENDENT_AMBULATORY_CARE_PROVIDER_SITE_OTHER): Payer: Medicare HMO | Admitting: Physician Assistant

## 2019-06-01 ENCOUNTER — Ambulatory Visit: Payer: Medicare HMO | Admitting: Physician Assistant

## 2019-06-01 DIAGNOSIS — Z6839 Body mass index (BMI) 39.0-39.9, adult: Secondary | ICD-10-CM

## 2019-06-01 DIAGNOSIS — R0600 Dyspnea, unspecified: Secondary | ICD-10-CM | POA: Diagnosis not present

## 2019-06-01 DIAGNOSIS — Z780 Asymptomatic menopausal state: Secondary | ICD-10-CM | POA: Diagnosis not present

## 2019-06-01 DIAGNOSIS — L659 Nonscarring hair loss, unspecified: Secondary | ICD-10-CM | POA: Diagnosis not present

## 2019-06-01 DIAGNOSIS — N3001 Acute cystitis with hematuria: Secondary | ICD-10-CM | POA: Diagnosis not present

## 2019-06-01 DIAGNOSIS — R8290 Unspecified abnormal findings in urine: Secondary | ICD-10-CM

## 2019-06-01 DIAGNOSIS — R531 Weakness: Secondary | ICD-10-CM

## 2019-06-01 DIAGNOSIS — E785 Hyperlipidemia, unspecified: Secondary | ICD-10-CM

## 2019-06-01 DIAGNOSIS — E1165 Type 2 diabetes mellitus with hyperglycemia: Secondary | ICD-10-CM

## 2019-06-01 LAB — POCT URINALYSIS DIP (CLINITEK)
Bilirubin, UA: NEGATIVE
Glucose, UA: NEGATIVE mg/dL
Ketones, POC UA: NEGATIVE mg/dL
Nitrite, UA: NEGATIVE
POC PROTEIN,UA: NEGATIVE
Spec Grav, UA: <=1.005 — AB (ref 1.010–1.025)
Urobilinogen, UA: 0.2 E.U./dL
pH, UA: 5 (ref 5.0–8.0)

## 2019-06-01 LAB — POCT UA - MICROALBUMIN
Albumin/Creatinine Ratio, Urine, POC: <30
Creatinine, POC: 200 mg/dL
Microalbumin Ur, POC: 30 mg/L

## 2019-06-01 MED ORDER — NITROFURANTOIN MONOHYD MACRO 100 MG PO CAPS: 100.0000 mg | ORAL_CAPSULE | Freq: Two times a day (BID) | ORAL | 0 refills | Status: DC

## 2019-06-01 MED ORDER — ALBUTEROL SULFATE HFA 108 (90 BASE) MCG/ACT IN AERS
2.0000 | INHALATION_SPRAY | Freq: Once | RESPIRATORY_TRACT | Status: AC
  Administered 2019-06-01: 2 via RESPIRATORY_TRACT

## 2019-06-01 NOTE — Progress Notes (Signed)
Patient in clinic for a pre and post spirometry. Results given to Provider assistant.

## 2019-06-01 NOTE — Progress Notes (Signed)
Subjective:    Patient ID: Nicole Giles, female    DOB: Sep 22, 1945, 74 y.o.   MRN: [id number]  HPI  Pt is a 74 yo female with OSA, T2DM, hemochromatosis who presents to the clinic for further evaluation of SOB and weakness.   For hemochromatosis she has Blood draws 500cc a week. Recently decided to decrease interval.   Pt continues to be SOB and easiliy fatigue with exertion. Recent echo looked great.   New urinary symptoms for last few days. Noticed a bad urine odor and some discomfort with urination. No fever, chills, n/v/d, flank pain.   .. Active Ambulatory Problems    Diagnosis Date Noted  . HYPERLIPIDEMIA 08/28/2007  . Morbid obesity ([place]) 11/17/2006  . HEADACHE, TENSION 10/12/2006  . OBSTRUCTIVE SLEEP APNEA 09/21/2007  . VERTIGO, BENIGN PAROXYSMAL POSITION 02/15/2007  . GERD 08/22/2008  . FATTY LIVER DISEASE 09/04/2008  . UNSPECIFIED DISORDER OF KIDNEY AND URETER 09/02/2008  . ARTHRITIS, KNEES, BILATERAL 06/01/2007  . CERVICALGIA 06/13/2008  . Pain in limb 11/13/2007  . DIZZINESS 08/22/2008  . FATIGUE 12/08/2006  . DEPENDENT EDEMA, LEGS 08/25/2007  . Dyspnea 08/22/2008  . CHEST PAIN 06/30/2007  . FREQUENCY, URINARY 09/09/2008  . TRANSAMINASES, SERUM, ELEVATED 08/27/2008  . INJURY OTHER AND UNSPECIFIED FINGER 10/04/2008  . Hyperlipemia 12/17/2013  . Arthralgia 12/17/2013  . Body aches 12/17/2013  . Elevated fasting glucose 01/16/2014  . Primary osteoarthritis of right knee 08/15/2014  . Peripheral edema 12/31/2015  . Elevated blood pressure 12/31/2015  . Polymyalgia ([place]) 08/11/2016  . No energy 08/19/2016  . Myalgia 08/19/2016  . Cough 08/19/2016  . 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 Weakness 09/19/2016  . Elevated C-reactive protein (CRP) 09/19/2016  . Acute pain of left shoulder 09/19/2016  . Pain of left calf 01/16/2017  . 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  Diverticulosis 05/12/2018  . Internal hemorrhoids 05/12/2018  . Colon polyp 05/12/2018  . Controlled type 2 diabetes mellitus with hyperglycemia, without long-term current use of insulin ([place]) 12/15/2018  . Hereditary hemochromatosis ([place]) 04/02/2019  . Nausea 04/02/2019   Resolved Ambulatory Problems    Diagnosis Date Noted  . 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 INFLUENZA DUE TO ID NOVEL H1N1 INFLUENZA VIRUS 03/20/2008  . UTI 11/29/2008  . Pre-diabetes 01/23/2014  . Pyelonephritis 08/19/2016   Past Medical History:  Diagnosis Date  . GERD (gastroesophageal reflux disease)   . H/O blood clots       Review of Systems See HPI.     Objective:   Physical Exam Vitals reviewed.  Constitutional:      Appearance: Normal appearance. She is obese.  HENT:     Head: Normocephalic.  Cardiovascular:     Rate and Rhythm: Normal rate and regular rhythm.  Pulmonary:     Effort: Pulmonary effort is normal.  Abdominal:     General: There is no distension.     Palpations: Abdomen is soft.     Tenderness: There is no abdominal tenderness. There is no right CVA tenderness or left CVA tenderness.  Neurological:     General: No focal deficit present.     Mental Status: She is alert and oriented to person, place, and time.  Psychiatric:        Mood and Affect: Mood normal.       .. Results for orders placed or performed in visit on 06/01/19  Urine Culture   Specimen: Urine  Result Value Ref Range   MICRO NUMBER: [id number]    SPECIMEN QUALITY: Adequate    Sample Source URINE    STATUS: FINAL    ISOLATE 1: Escherichia coli (A)       Susceptibility   Escherichia coli - URINE CULTURE, REFLEX    AMOX/CLAVULANIC <=2 Sensitive     AMPICILLIN <=2 Sensitive     AMPICILLIN/SULBACTAM <=2 Sensitive     CEFAZOLIN* <=4 Not Reportable      * For infections other than uncomplicated UTIcaused by E. coli, K. pneumoniae or P. mirabilis:Cefazolin is resistant if MIC > or = 8 mcg/mL.(Distinguishing  susceptible versus intermediatefor isolates with MIC < or = 4 mcg/mL requiresadditional testing.)For uncomplicated UTI caused by E. coli,K. pneumoniae or P. mirabilis: Cefazolin issusceptible if MIC <32 mcg/mL and predictssusceptible to the oral agents cefaclor, cefdinir,cefpodoxime, cefprozil, cefuroxime, cephalexinand loracarbef.    CEFEPIME <=1 Sensitive     CEFTRIAXONE <=1 Sensitive     CIPROFLOXACIN <=0.25 Sensitive     LEVOFLOXACIN <=0.12 Sensitive     ERTAPENEM <=0.5 Sensitive     GENTAMICIN <=1 Sensitive     IMIPENEM <=0.25 Sensitive     NITROFURANTOIN <=16 Sensitive     PIP/TAZO <=4 Sensitive     TOBRAMYCIN <=1 Sensitive     TRIMETH/SULFA* <=20 Sensitive      * For infections other than uncomplicated UTIcaused by E. coli, K. pneumoniae or P. mirabilis:Cefazolin is resistant if MIC > or = 8 mcg/mL.(Distinguishing susceptible versus intermediatefor isolates with MIC < or = 4 mcg/mL requiresadditional testing.)For uncomplicated UTI caused by E. coli,K. pneumoniae or P. mirabilis: Cefazolin issusceptible if MIC <32 mcg/mL and predictssusceptible to the oral agents cefaclor, cefdinir,cefpodoxime, cefprozil, cefuroxime, cephalexinand loracarbef.Legend:S = Susceptible  I = IntermediateR = Resistant  NS = Not susceptible* = Not tested  NR = Not reported**NN = See antimicrobic comments  POCT URINALYSIS DIP (CLINITEK)  Result Value Ref Range   Color, UA yellow yellow   Clarity, UA clear clear   Glucose, UA negative negative mg/dL   Bilirubin, UA negative negative   Ketones, POC UA negative negative mg/dL   Spec Grav, UA <=1.005 (A) 1.010 - 1.025   Blood, UA trace-intact (A) negative   pH, UA 5.0 5.0 - 8.0   POC PROTEIN,UA negative negative, trace   Urobilinogen, UA 0.2 0.2 or 1.0 E.U./dL   Nitrite, UA Negative Negative   Leukocytes, UA Moderate (2+) (A) Negative  POCT UA - Microalbumin  Result Value Ref Range   Microalbumin Ur, POC 30 mg/L   Creatinine, POC 200 mg/dL    Albumin/Creatinine Ratio, Urine, POC <30        Assessment & Plan:  Marland KitchenMarland KitchenChardee was seen today for spirometry.  Diagnoses and all orders for this visit:  Dyspnea, unspecified type -     PR EVAL OF BRONCHOSPASM -     albuterol (VENTOLIN HFA) 108 (90 Base) MCG/ACT inhaler 2 puff  Bad odor of urine -     POCT URINALYSIS DIP (CLINITEK) -     Urine Culture -     nitrofurantoin, macrocrystal-monohydrate, (MACROBID) 100 MG capsule; Take 1 capsule (100 mg total) by mouth 2 (two) times daily.  Controlled type 2 diabetes mellitus with hyperglycemia, without long-term current use of insulin (HCC) -     POCT UA - Microalbumin -     COMPLETE METABOLIC PANEL WITH GFR -     TSH  Acute cystitis with hematuria -     COMPLETE METABOLIC PANEL WITH GFR -  nitrofurantoin, macrocrystal-monohydrate, (MACROBID) 100 MG capsule; Take 1 capsule (100 mg total) by mouth 2 (two) times daily.  Hyperlipidemia LDL goal <70 -     Lipid Panel w/reflex Direct LDL  Hereditary hemochromatosis (HCC) -     CBC with Differential/Platelet  Post-menopausal -     VITAMIN D 25 Hydroxy (Vit-D Deficiency, Fractures) -     B12 and Folate Panel -     TSH  Class 2 severe obesity due to excess calories with serious comorbidity and body mass index (BMI) of 39.0 to 39.9 in adult (HCC)  Hair loss -     CBC with Differential/Platelet -     VITAMIN D 25 Hydroxy (Vit-D Deficiency, Fractures) -     B12 and Folate Panel -     TSH  Weakness    FEV1 88 percent FVC 78 percent.  On her post test she did much worse.  I think that was due to test fatigue.  Walk test was normal, echo normal, EKG normal, CXR normal.  I suspect that SOB is physical deconditioning and possible blood draws for hemochromatosis. Decided to decrease interval. Will see if that helps energy in 6 weeks.   Having some urinary symptoms. UA positive for leukocytes and blood. Treated with macrobid empirically. Will culture. Discussed symptomatic care.  Follow up as needed.

## 2019-06-03 LAB — URINE CULTURE
MICRO NUMBER:: 10071668
SPECIMEN QUALITY:: ADEQUATE

## 2019-06-04 NOTE — Progress Notes (Signed)
Call pt: E.coli found in urine. Macrobid given last week should work. Continue medication and finish all of it.

## 2019-06-07 DIAGNOSIS — G4733 Obstructive sleep apnea (adult) (pediatric): Secondary | ICD-10-CM | POA: Diagnosis not present

## 2019-06-11 ENCOUNTER — Encounter: Payer: Self-pay | Admitting: Physician Assistant

## 2019-07-04 DIAGNOSIS — Z86711 Personal history of pulmonary embolism: Secondary | ICD-10-CM | POA: Diagnosis not present

## 2019-07-04 DIAGNOSIS — Z79899 Other long term (current) drug therapy: Secondary | ICD-10-CM | POA: Diagnosis not present

## 2019-07-04 DIAGNOSIS — Z87891 Personal history of nicotine dependence: Secondary | ICD-10-CM | POA: Diagnosis not present

## 2019-07-04 DIAGNOSIS — E119 Type 2 diabetes mellitus without complications: Secondary | ICD-10-CM | POA: Diagnosis not present

## 2019-07-08 DIAGNOSIS — G4733 Obstructive sleep apnea (adult) (pediatric): Secondary | ICD-10-CM | POA: Diagnosis not present

## 2019-08-05 DIAGNOSIS — G4733 Obstructive sleep apnea (adult) (pediatric): Secondary | ICD-10-CM | POA: Diagnosis not present

## 2019-08-06 DIAGNOSIS — Z87891 Personal history of nicotine dependence: Secondary | ICD-10-CM | POA: Diagnosis not present

## 2019-08-06 DIAGNOSIS — Z86711 Personal history of pulmonary embolism: Secondary | ICD-10-CM | POA: Diagnosis not present

## 2019-08-06 DIAGNOSIS — K219 Gastro-esophageal reflux disease without esophagitis: Secondary | ICD-10-CM | POA: Diagnosis not present

## 2019-08-06 DIAGNOSIS — E119 Type 2 diabetes mellitus without complications: Secondary | ICD-10-CM | POA: Diagnosis not present

## 2019-08-14 ENCOUNTER — Other Ambulatory Visit: Payer: Self-pay | Admitting: Physician Assistant

## 2019-08-14 DIAGNOSIS — E1165 Type 2 diabetes mellitus with hyperglycemia: Secondary | ICD-10-CM

## 2019-09-05 DIAGNOSIS — G4733 Obstructive sleep apnea (adult) (pediatric): Secondary | ICD-10-CM | POA: Diagnosis not present

## 2019-09-13 ENCOUNTER — Other Ambulatory Visit: Payer: Self-pay | Admitting: Physician Assistant

## 2019-09-13 DIAGNOSIS — E1165 Type 2 diabetes mellitus with hyperglycemia: Secondary | ICD-10-CM

## 2019-09-25 DIAGNOSIS — R7989 Other specified abnormal findings of blood chemistry: Secondary | ICD-10-CM | POA: Diagnosis not present

## 2019-09-25 DIAGNOSIS — Z6841 Body Mass Index (BMI) 40.0 and over, adult: Secondary | ICD-10-CM | POA: Diagnosis not present

## 2019-09-25 DIAGNOSIS — Z86711 Personal history of pulmonary embolism: Secondary | ICD-10-CM | POA: Diagnosis not present

## 2019-09-25 DIAGNOSIS — D6859 Other primary thrombophilia: Secondary | ICD-10-CM | POA: Diagnosis not present

## 2019-09-25 DIAGNOSIS — D6869 Other thrombophilia: Secondary | ICD-10-CM | POA: Diagnosis not present

## 2019-09-25 DIAGNOSIS — K760 Fatty (change of) liver, not elsewhere classified: Secondary | ICD-10-CM | POA: Diagnosis not present

## 2019-09-25 DIAGNOSIS — G4733 Obstructive sleep apnea (adult) (pediatric): Secondary | ICD-10-CM | POA: Diagnosis not present

## 2019-09-25 DIAGNOSIS — Z9989 Dependence on other enabling machines and devices: Secondary | ICD-10-CM | POA: Diagnosis not present

## 2019-10-17 ENCOUNTER — Other Ambulatory Visit: Payer: Self-pay | Admitting: Physician Assistant

## 2019-10-17 DIAGNOSIS — E1165 Type 2 diabetes mellitus with hyperglycemia: Secondary | ICD-10-CM

## 2019-10-25 ENCOUNTER — Other Ambulatory Visit: Payer: Self-pay | Admitting: Neurology

## 2019-10-25 DIAGNOSIS — E1165 Type 2 diabetes mellitus with hyperglycemia: Secondary | ICD-10-CM

## 2019-10-26 ENCOUNTER — Other Ambulatory Visit: Payer: Self-pay | Admitting: Physician Assistant

## 2019-10-26 DIAGNOSIS — E1165 Type 2 diabetes mellitus with hyperglycemia: Secondary | ICD-10-CM

## 2019-10-29 ENCOUNTER — Other Ambulatory Visit: Payer: Self-pay | Admitting: Physician Assistant

## 2019-10-29 DIAGNOSIS — E1165 Type 2 diabetes mellitus with hyperglycemia: Secondary | ICD-10-CM

## 2019-10-30 ENCOUNTER — Ambulatory Visit (INDEPENDENT_AMBULATORY_CARE_PROVIDER_SITE_OTHER): Payer: Medicare HMO | Admitting: Physician Assistant

## 2019-10-30 ENCOUNTER — Telehealth: Payer: Self-pay | Admitting: Neurology

## 2019-10-30 ENCOUNTER — Encounter: Payer: Self-pay | Admitting: Physician Assistant

## 2019-10-30 ENCOUNTER — Ambulatory Visit: Payer: Medicare HMO | Admitting: Nurse Practitioner

## 2019-10-30 VITALS — BP 133/71 | HR 81 | Ht 59.0 in | Wt 206.0 lb

## 2019-10-30 DIAGNOSIS — E785 Hyperlipidemia, unspecified: Secondary | ICD-10-CM | POA: Diagnosis not present

## 2019-10-30 DIAGNOSIS — E1165 Type 2 diabetes mellitus with hyperglycemia: Secondary | ICD-10-CM

## 2019-10-30 DIAGNOSIS — Z9989 Dependence on other enabling machines and devices: Secondary | ICD-10-CM

## 2019-10-30 DIAGNOSIS — G4733 Obstructive sleep apnea (adult) (pediatric): Secondary | ICD-10-CM

## 2019-10-30 LAB — POCT GLYCOSYLATED HEMOGLOBIN (HGB A1C): Hemoglobin A1C: 5.7 % — AB (ref 4.0–5.6)

## 2019-10-30 MED ORDER — OZEMPIC (0.25 OR 0.5 MG/DOSE) 2 MG/1.5ML ~~LOC~~ SOPN: 0.5000 mg | PEN_INJECTOR | SUBCUTANEOUS | 5 refills | Status: DC

## 2019-10-30 MED ORDER — ROSUVASTATIN CALCIUM 5 MG PO TABS: 5.0000 mg | ORAL_TABLET | Freq: Every day | ORAL | 3 refills | Status: DC

## 2019-10-30 NOTE — Progress Notes (Signed)
Subjective:    Patient ID: Nicole Giles, female    DOB: 09/26/1945, 74 y.o.   MRN: [id number]  HPI Patient is a 74 year old obese female with type 2 diabetes, hereditary hemochromatosis, polymyalgia, hyperlipidemia, OSA on CPAP who presents to the clinic for follow-up and medication refill.  Patient is only on Ozempic for diabetes.  She is found that this helps her with her appetite control as well.  She likes this medication and is tolerating it well.  She is not checking her sugars.  She denies any open wounds or sores.  She denies any hypoglycemic events.  She is staying active with working a lot but no exercise.  She does watch what she eats and she pays special attention to portion size.  Patient denies any chest pain, shortness of breath, headache, vision changes, dizziness.  Patient continues to use her CPAP nightly.  She does report that sometimes in the middle the night she takes it off without knowing.  Hematology is managing her hemochromatosis.  Her ferritin levels have improved.  She is feeling better with this regulation.  .. Active Ambulatory Problems    Diagnosis Date Noted  . HYPERLIPIDEMIA 08/28/2007  . Morbid obesity ([place]) 11/17/2006  . HEADACHE, TENSION 10/12/2006  . OSA on CPAP 09/21/2007  . VERTIGO, BENIGN PAROXYSMAL POSITION 02/15/2007  . GERD 08/22/2008  . FATTY LIVER DISEASE 09/04/2008  . UNSPECIFIED DISORDER OF KIDNEY AND URETER 09/02/2008  . ARTHRITIS, KNEES, BILATERAL 06/01/2007  . CERVICALGIA 06/13/2008  . Pain in limb 11/13/2007  . DIZZINESS 08/22/2008  . FATIGUE 12/08/2006  . DEPENDENT EDEMA, LEGS 08/25/2007  . Dyspnea 08/22/2008  . CHEST PAIN 06/30/2007  . FREQUENCY, URINARY 09/09/2008  . TRANSAMINASES, SERUM, ELEVATED 08/27/2008  . INJURY OTHER AND UNSPECIFIED FINGER 10/04/2008  . Hyperlipidemia LDL goal <70 12/17/2013  . Arthralgia 12/17/2013  . Body aches 12/17/2013  . Elevated fasting glucose 01/16/2014  . Primary osteoarthritis of  right knee 08/15/2014  . Peripheral edema 12/31/2015  . Elevated blood pressure 12/31/2015  . Polymyalgia ([place]) 08/11/2016  . No energy 08/19/2016  . Myalgia 08/19/2016  . Cough 08/19/2016  . 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 Weakness 09/19/2016  . Elevated C-reactive protein (CRP) 09/19/2016  . Acute pain of left shoulder 09/19/2016  . Pain of left calf 01/16/2017  . 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 Diverticulosis 05/12/2018  . Internal hemorrhoids 05/12/2018  . Colon polyp 05/12/2018  . Controlled type 2 diabetes mellitus with hyperglycemia, without long-term current use of insulin ([place]) 12/15/2018  . Hereditary hemochromatosis ([place]) 04/02/2019  . Nausea 04/02/2019   Resolved Ambulatory Problems    Diagnosis Date Noted  . 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 INFLUENZA DUE TO ID NOVEL H1N1 INFLUENZA VIRUS 03/20/2008  . UTI 11/29/2008  . Pre-diabetes 01/23/2014  . Pyelonephritis 08/19/2016   Past Medical History:  Diagnosis Date  . GERD (gastroesophageal reflux disease)   . H/O blood clots   . OBSTRUCTIVE SLEEP APNEA 09/21/2007      Review of Systems  All other systems reviewed and are negative.      Objective:   Physical Exam Vitals reviewed.  Constitutional:      Appearance: Normal appearance. She is obese.  HENT:     Head: Normocephalic.  Cardiovascular:     Rate and Rhythm: Normal rate and regular rhythm.     Pulses: Normal pulses.     Heart sounds: Normal heart  sounds.  Pulmonary:     Effort: Pulmonary effort is normal.     Breath sounds: Normal breath sounds.  Musculoskeletal:     Right lower leg: No edema.     Left lower leg: No edema.  Neurological:     General: No focal deficit present.     Mental Status: She is alert and oriented to person, place, and time.  Psychiatric:        Mood and Affect: Mood normal.        Behavior: Behavior normal.        .. Depression screen [facility] 2/9 10/30/2019 04/04/2018 03/13/2018 01/14/2017 09/16/2016  Decreased Interest 0 0 0 0 0  Down, Depressed, Hopeless 0 0 1 0 0  PHQ - 2 Score 0 0 1 0 0  Altered sleeping 3 3 3  - 3  Tired, decreased energy 1 3 3  - 3  Change in appetite 3 3 1  - 3  Feeling bad or failure about yourself  0 - 0 - 0  Trouble concentrating 0 - 3 - 1  Moving slowly or fidgety/restless 0 0 2 - 0  Suicidal thoughts 0 0 0 - 0  PHQ-9 Score 7 9 13  - 10  Difficult doing work/chores Somewhat difficult Somewhat difficult Somewhat difficult - -   .[name] GAD 7 : Generalized Anxiety Score 10/30/2019 04/04/2018 03/13/2018  Nervous, Anxious, on Edge 0 0 0  Control/stop worrying 0 0 0  Worry too much - different things 0 0 0  Trouble relaxing 0 0 0  Restless 0 0 0  Easily annoyed or irritable 0 0 0  Afraid - awful might happen 0 0 0  Total GAD 7 Score 0 0 0  Anxiety Difficulty Not difficult at all Not difficult at all Not difficult at all        Assessment & Plan:  Marland KitchenMarland KitchenFumiko was seen today for diabetes.  Diagnoses and all orders for this visit:  Controlled type 2 diabetes mellitus with hyperglycemia, without long-term current use of insulin (HCC) -     POCT glycosylated hemoglobin (Hb A1C) -     Semaglutide,0.25 or 0.5MG /DOS, (OZEMPIC, 0.25 OR 0.5 MG/DOSE,) 2 MG/1.5ML SOPN; Inject 0.375 mLs (0.5 mg total) into the skin once a week. -     COMPLETE METABOLIC PANEL WITH GFR  Morbid obesity (HCC) -     Semaglutide,0.25 or 0.5MG /DOS, (OZEMPIC, 0.25 OR 0.5 MG/DOSE,) 2 MG/1.5ML SOPN; Inject 0.375 mLs (0.5 mg total) into the skin once a week.  Hyperlipidemia LDL goal <70 -     rosuvastatin (CRESTOR) 5 MG tablet; Take 1 tablet (5 mg total) by mouth daily. -     Lipid Panel w/reflex Direct LDL  OSA on CPAP    Results for orders placed or performed in visit on 10/30/19  POCT glycosylated hemoglobin (Hb A1C)  Result Value Ref Range   Hemoglobin A1C 5.7 (A) 4.0 - 5.6 %   HbA1c POC (<>  result, manual entry)     HbA1c, POC (prediabetic range)     HbA1c, POC (controlled diabetic range)     A1c looks fabulous. Continue with Ozempic.  There is some concern about cost.  I did give her 1 sample in office today.  We did get the paperwork started for patient assistance to see if we can make this affordable.  At this time she does not want to try another GLP-1 in this class.  She has tolerated this well and likes the effects. BP to goal.  On STATIN. Recheck Lipid.  Declined PNA. Eye exam and foot exam UTD.   [name].Discussed low carb diet with 1500 calories and 80g of protein.  Exercising at least 150 minutes a week.  [employer] could be a Microbiologist.

## 2019-10-30 NOTE — Telephone Encounter (Signed)
Patient left 3 vm since yesterday afternoon about filling Ozempic, this was denied due to patient being overdue for appt (was to follow up in February, medication has been slowly tapered down with notes to call for appt since that time).   After review of chart, it looks like patient moved appt to today with Clarise Cruz to discuss.

## 2019-10-31 ENCOUNTER — Encounter: Payer: Self-pay | Admitting: Physician Assistant

## 2019-12-13 DIAGNOSIS — G4733 Obstructive sleep apnea (adult) (pediatric): Secondary | ICD-10-CM | POA: Diagnosis not present

## 2020-02-07 IMAGING — MG DIGITAL DIAGNOSTIC UNILATERAL RIGHT MAMMOGRAM
3 series · 3 of 3 positions shown · non-contrast
Comparison: Previous exam(s).

CLINICAL DATA: The patient was called back for right breast
calcifications.

EXAM:
DIGITAL DIAGNOSTIC RIGHT MAMMOGRAM WITH CAD

[R CC]
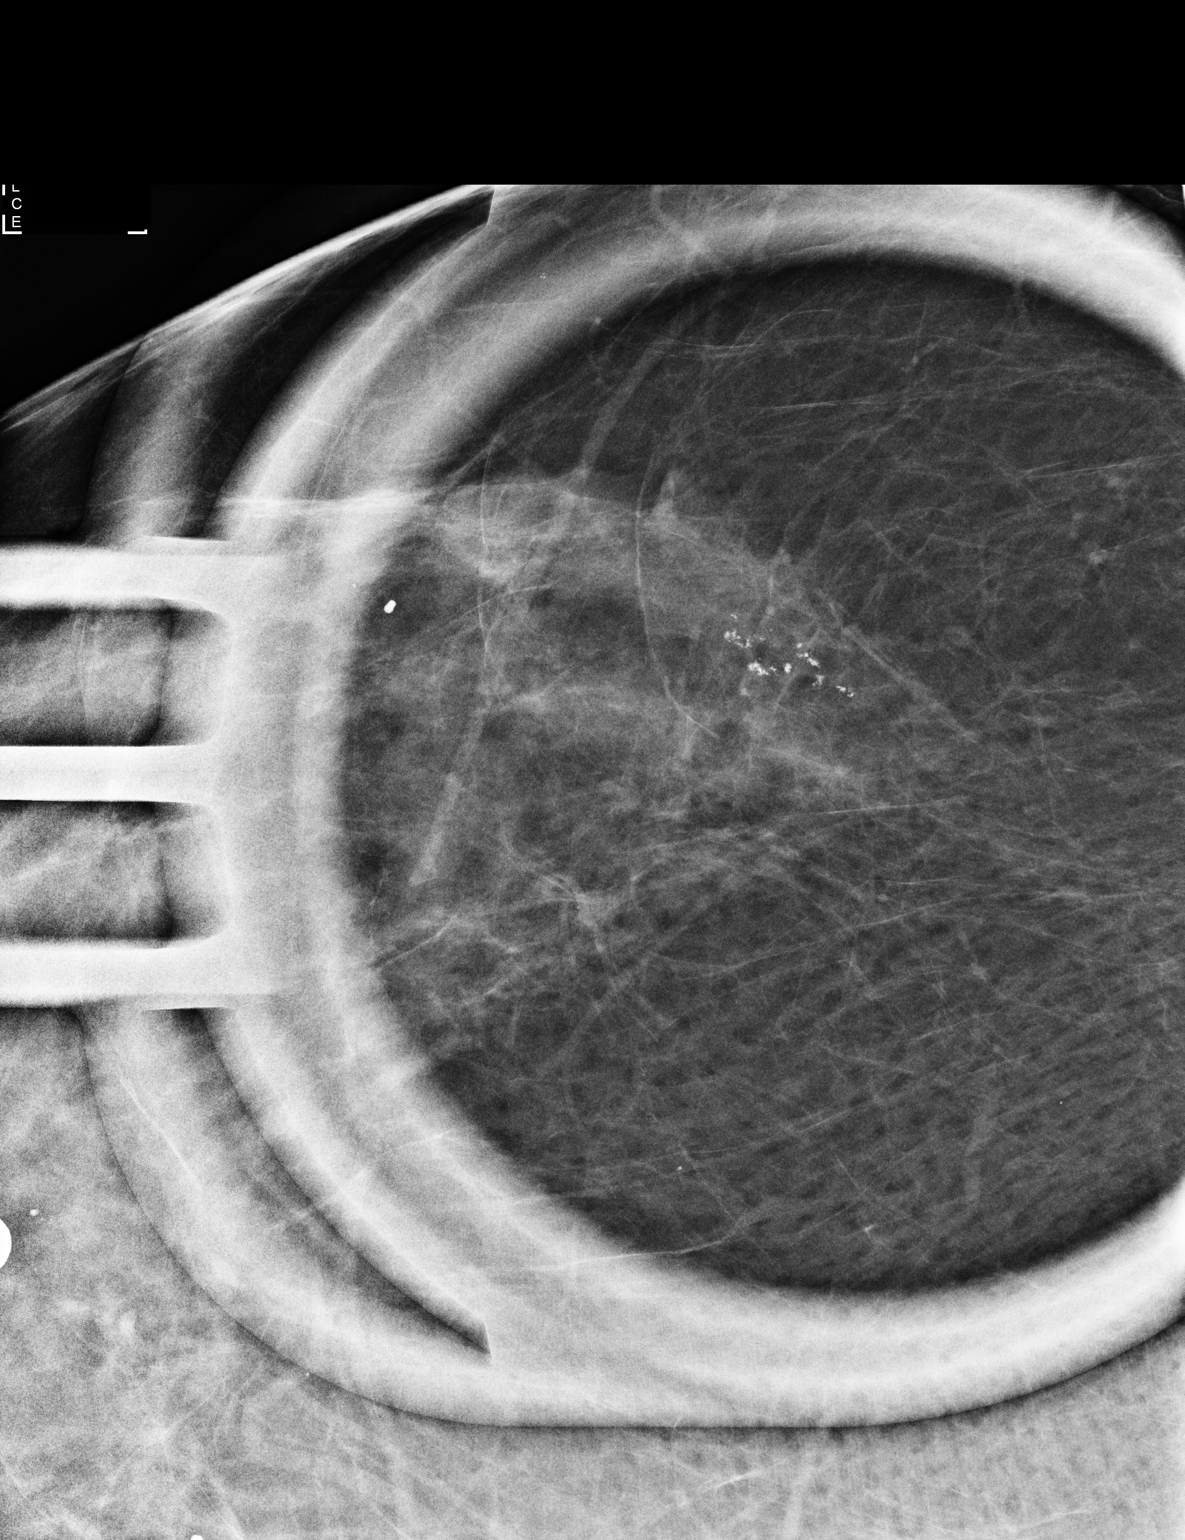

[R ML (1 of 2)]
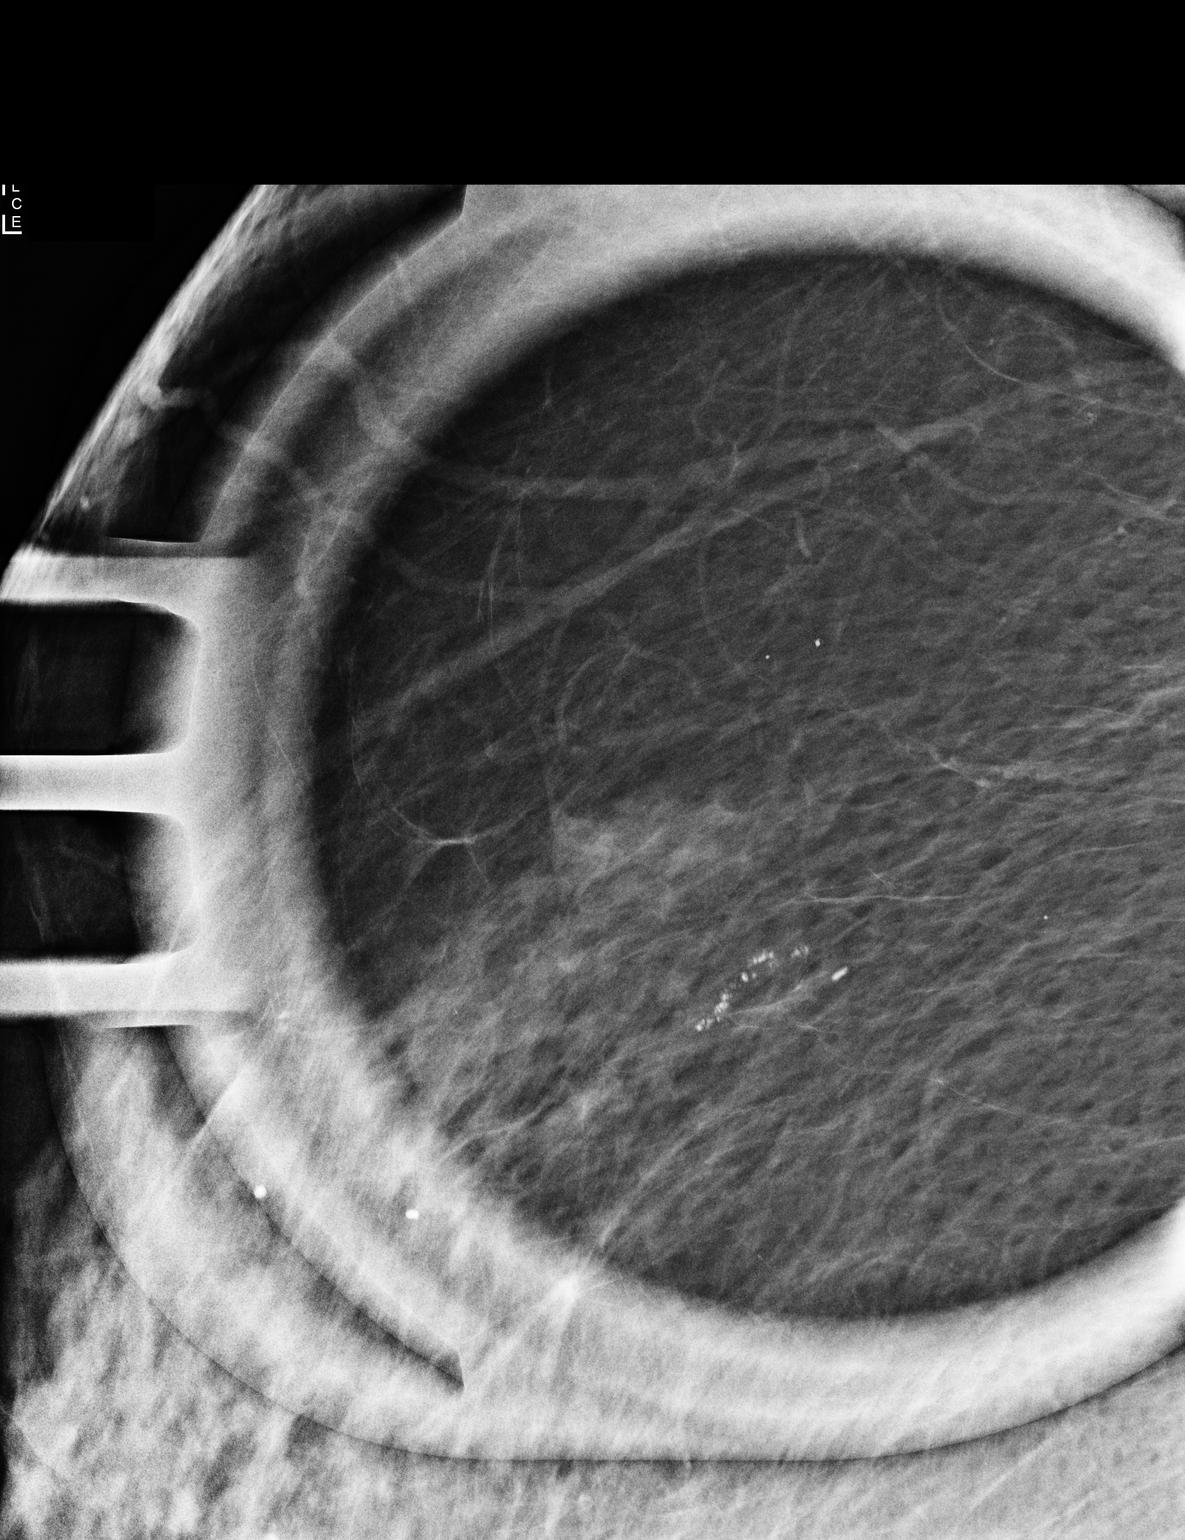

[R ML (2 of 2)]
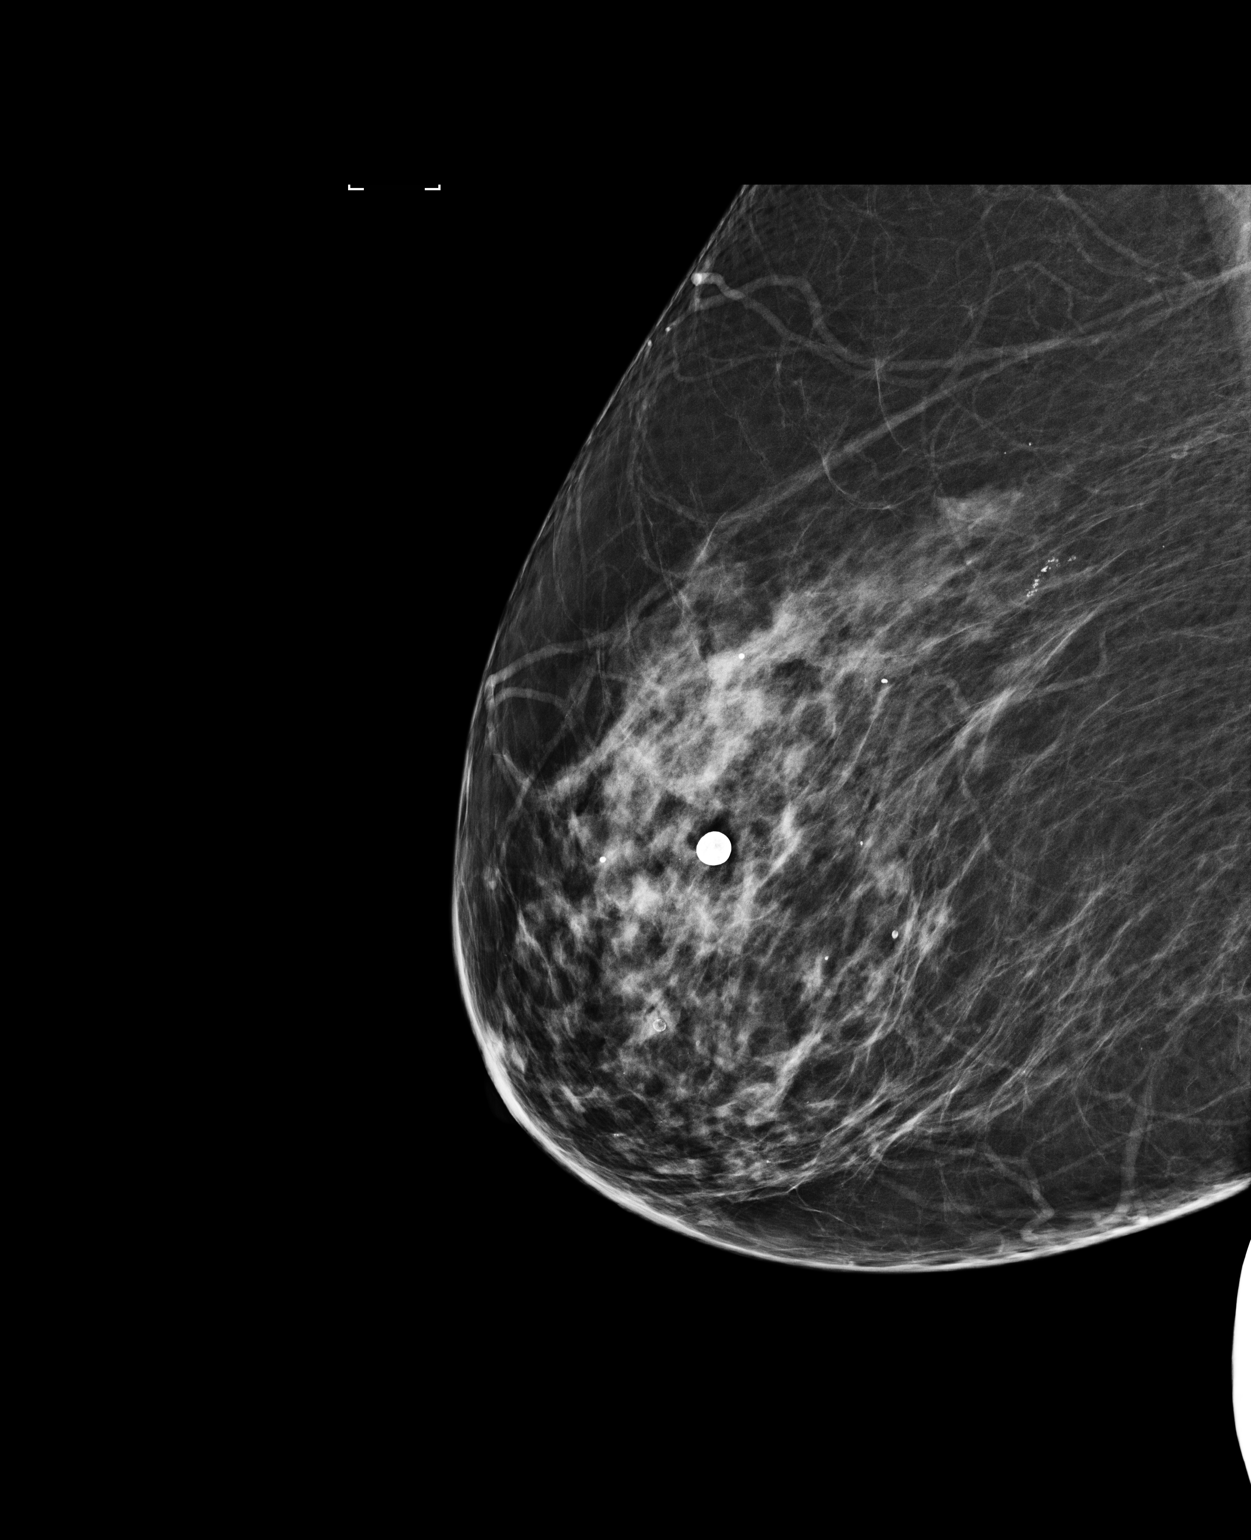

[3 of 3 positions shown; findings below may reference images not displayed]

ACR Breast Density Category c: The breast tissue is heterogeneously
dense, which may obscure small masses.
FINDINGS: There are coarsened heterogeneous right breast calcifications
spanning 13 mm which represent a change compared to previous
studies.

Mammographic images were processed with CAD.
IMPRESSION: Coarsened heterogeneous right breast calcifications.

RECOMMENDATION:
While fibroadenomatoid changes are considered most likely, the right
breast calcifications are indeterminate and represent a change.
Recommend stereotactic biopsy of the calcifications.

I have discussed the findings and recommendations with the patient.
Results were also provided in writing at the conclusion of the
visit. If applicable, a reminder letter will be sent to the patient
regarding the next appointment.

BI-RADS CATEGORY  4: Suspicious.

## 2020-02-26 ENCOUNTER — Ambulatory Visit: Payer: Medicare HMO | Admitting: Physician Assistant

## 2020-03-05 DIAGNOSIS — H25813 Combined forms of age-related cataract, bilateral: Secondary | ICD-10-CM | POA: Diagnosis not present

## 2020-03-05 DIAGNOSIS — H524 Presbyopia: Secondary | ICD-10-CM | POA: Diagnosis not present

## 2020-03-05 DIAGNOSIS — E119 Type 2 diabetes mellitus without complications: Secondary | ICD-10-CM | POA: Diagnosis not present

## 2020-03-05 DIAGNOSIS — H5213 Myopia, bilateral: Secondary | ICD-10-CM | POA: Diagnosis not present

## 2020-03-13 DIAGNOSIS — R7989 Other specified abnormal findings of blood chemistry: Secondary | ICD-10-CM | POA: Diagnosis not present

## 2020-03-13 DIAGNOSIS — R7303 Prediabetes: Secondary | ICD-10-CM | POA: Diagnosis not present

## 2020-03-13 LAB — IRON,TIBC AND FERRITIN PANEL
%SAT: 17
Ferritin: 53
Iron: 48
TIBC: 276
UIBC: 228

## 2020-03-13 LAB — CBC AND DIFFERENTIAL
HCT: 40 (ref 36–46)
Hemoglobin: 13.4 (ref 12.0–16.0)
Platelets: 92 — AB (ref 150–399)
WBC: 6.5

## 2020-03-13 LAB — HEMOGLOBIN A1C: Hemoglobin A1C: 6.5

## 2020-03-13 LAB — CBC: RBC: 4.23 (ref 3.87–5.11)

## 2020-03-18 DIAGNOSIS — R200 Anesthesia of skin: Secondary | ICD-10-CM | POA: Diagnosis not present

## 2020-03-18 DIAGNOSIS — Z86711 Personal history of pulmonary embolism: Secondary | ICD-10-CM | POA: Diagnosis not present

## 2020-03-18 DIAGNOSIS — R29898 Other symptoms and signs involving the musculoskeletal system: Secondary | ICD-10-CM | POA: Diagnosis not present

## 2020-03-18 DIAGNOSIS — M4712 Other spondylosis with myelopathy, cervical region: Secondary | ICD-10-CM | POA: Diagnosis not present

## 2020-03-18 DIAGNOSIS — G459 Transient cerebral ischemic attack, unspecified: Secondary | ICD-10-CM | POA: Diagnosis not present

## 2020-03-18 DIAGNOSIS — M542 Cervicalgia: Secondary | ICD-10-CM | POA: Diagnosis not present

## 2020-03-18 DIAGNOSIS — I080 Rheumatic disorders of both mitral and aortic valves: Secondary | ICD-10-CM | POA: Diagnosis not present

## 2020-03-18 DIAGNOSIS — E785 Hyperlipidemia, unspecified: Secondary | ICD-10-CM | POA: Diagnosis not present

## 2020-03-18 DIAGNOSIS — R079 Chest pain, unspecified: Secondary | ICD-10-CM | POA: Diagnosis not present

## 2020-03-18 DIAGNOSIS — E161 Other hypoglycemia: Secondary | ICD-10-CM | POA: Diagnosis not present

## 2020-03-18 DIAGNOSIS — I10 Essential (primary) hypertension: Secondary | ICD-10-CM | POA: Diagnosis not present

## 2020-03-18 DIAGNOSIS — Z20822 Contact with and (suspected) exposure to covid-19: Secondary | ICD-10-CM | POA: Diagnosis not present

## 2020-03-18 DIAGNOSIS — M4802 Spinal stenosis, cervical region: Secondary | ICD-10-CM | POA: Diagnosis not present

## 2020-03-18 DIAGNOSIS — R9082 White matter disease, unspecified: Secondary | ICD-10-CM | POA: Diagnosis not present

## 2020-03-18 DIAGNOSIS — R0789 Other chest pain: Secondary | ICD-10-CM | POA: Diagnosis not present

## 2020-03-18 DIAGNOSIS — M50822 Other cervical disc disorders at C5-C6 level: Secondary | ICD-10-CM | POA: Diagnosis not present

## 2020-03-18 DIAGNOSIS — G992 Myelopathy in diseases classified elsewhere: Secondary | ICD-10-CM | POA: Diagnosis not present

## 2020-03-18 DIAGNOSIS — Z888 Allergy status to other drugs, medicaments and biological substances status: Secondary | ICD-10-CM | POA: Diagnosis not present

## 2020-03-18 DIAGNOSIS — Z885 Allergy status to narcotic agent status: Secondary | ICD-10-CM | POA: Diagnosis not present

## 2020-03-18 DIAGNOSIS — R4781 Slurred speech: Secondary | ICD-10-CM | POA: Diagnosis not present

## 2020-03-18 DIAGNOSIS — K219 Gastro-esophageal reflux disease without esophagitis: Secondary | ICD-10-CM | POA: Diagnosis not present

## 2020-03-18 DIAGNOSIS — M5412 Radiculopathy, cervical region: Secondary | ICD-10-CM | POA: Diagnosis not present

## 2020-03-18 DIAGNOSIS — I454 Nonspecific intraventricular block: Secondary | ICD-10-CM | POA: Diagnosis not present

## 2020-03-18 DIAGNOSIS — R0902 Hypoxemia: Secondary | ICD-10-CM | POA: Diagnosis not present

## 2020-03-18 DIAGNOSIS — R9431 Abnormal electrocardiogram [ECG] [EKG]: Secondary | ICD-10-CM | POA: Diagnosis not present

## 2020-03-18 DIAGNOSIS — M50321 Other cervical disc degeneration at C4-C5 level: Secondary | ICD-10-CM | POA: Diagnosis not present

## 2020-03-18 DIAGNOSIS — M5000 Cervical disc disorder with myelopathy, unspecified cervical region: Secondary | ICD-10-CM | POA: Diagnosis not present

## 2020-03-18 DIAGNOSIS — R531 Weakness: Secondary | ICD-10-CM | POA: Diagnosis not present

## 2020-03-18 DIAGNOSIS — R410 Disorientation, unspecified: Secondary | ICD-10-CM | POA: Diagnosis not present

## 2020-03-18 DIAGNOSIS — R42 Dizziness and giddiness: Secondary | ICD-10-CM | POA: Diagnosis not present

## 2020-03-18 DIAGNOSIS — Z87891 Personal history of nicotine dependence: Secondary | ICD-10-CM | POA: Diagnosis not present

## 2020-03-18 DIAGNOSIS — E162 Hypoglycemia, unspecified: Secondary | ICD-10-CM | POA: Diagnosis not present

## 2020-03-18 DIAGNOSIS — E119 Type 2 diabetes mellitus without complications: Secondary | ICD-10-CM | POA: Diagnosis not present

## 2020-03-18 DIAGNOSIS — R059 Cough, unspecified: Secondary | ICD-10-CM | POA: Diagnosis not present

## 2020-03-18 DIAGNOSIS — M47812 Spondylosis without myelopathy or radiculopathy, cervical region: Secondary | ICD-10-CM | POA: Diagnosis not present

## 2020-03-18 DIAGNOSIS — G959 Disease of spinal cord, unspecified: Secondary | ICD-10-CM | POA: Diagnosis not present

## 2020-03-18 DIAGNOSIS — N3000 Acute cystitis without hematuria: Secondary | ICD-10-CM | POA: Diagnosis not present

## 2020-03-18 DIAGNOSIS — K760 Fatty (change of) liver, not elsewhere classified: Secondary | ICD-10-CM | POA: Diagnosis not present

## 2020-03-18 DIAGNOSIS — Z6841 Body Mass Index (BMI) 40.0 and over, adult: Secondary | ICD-10-CM | POA: Diagnosis not present

## 2020-03-18 DIAGNOSIS — G4733 Obstructive sleep apnea (adult) (pediatric): Secondary | ICD-10-CM | POA: Diagnosis not present

## 2020-03-19 DIAGNOSIS — G459 Transient cerebral ischemic attack, unspecified: Secondary | ICD-10-CM | POA: Diagnosis not present

## 2020-03-19 DIAGNOSIS — E161 Other hypoglycemia: Secondary | ICD-10-CM | POA: Diagnosis not present

## 2020-03-19 DIAGNOSIS — E162 Hypoglycemia, unspecified: Secondary | ICD-10-CM | POA: Diagnosis not present

## 2020-03-19 DIAGNOSIS — R531 Weakness: Secondary | ICD-10-CM | POA: Diagnosis not present

## 2020-03-19 DIAGNOSIS — R0902 Hypoxemia: Secondary | ICD-10-CM | POA: Diagnosis not present

## 2020-03-19 DIAGNOSIS — N3000 Acute cystitis without hematuria: Secondary | ICD-10-CM | POA: Diagnosis not present

## 2020-03-19 DIAGNOSIS — M542 Cervicalgia: Secondary | ICD-10-CM | POA: Diagnosis not present

## 2020-03-19 DIAGNOSIS — R4781 Slurred speech: Secondary | ICD-10-CM | POA: Diagnosis not present

## 2020-03-20 ENCOUNTER — Inpatient Hospital Stay: Payer: Medicare HMO | Admitting: Family Medicine

## 2020-03-20 DIAGNOSIS — M50321 Other cervical disc degeneration at C4-C5 level: Secondary | ICD-10-CM | POA: Diagnosis not present

## 2020-03-20 DIAGNOSIS — I080 Rheumatic disorders of both mitral and aortic valves: Secondary | ICD-10-CM | POA: Diagnosis not present

## 2020-03-20 DIAGNOSIS — M50822 Other cervical disc disorders at C5-C6 level: Secondary | ICD-10-CM | POA: Diagnosis not present

## 2020-03-20 DIAGNOSIS — M47812 Spondylosis without myelopathy or radiculopathy, cervical region: Secondary | ICD-10-CM | POA: Diagnosis not present

## 2020-03-20 DIAGNOSIS — M4802 Spinal stenosis, cervical region: Secondary | ICD-10-CM | POA: Diagnosis not present

## 2020-03-20 DIAGNOSIS — N3000 Acute cystitis without hematuria: Secondary | ICD-10-CM | POA: Diagnosis not present

## 2020-03-20 DIAGNOSIS — R531 Weakness: Secondary | ICD-10-CM | POA: Diagnosis not present

## 2020-03-20 DIAGNOSIS — R29898 Other symptoms and signs involving the musculoskeletal system: Secondary | ICD-10-CM | POA: Diagnosis not present

## 2020-03-21 DIAGNOSIS — M4802 Spinal stenosis, cervical region: Secondary | ICD-10-CM | POA: Diagnosis not present

## 2020-03-22 DIAGNOSIS — M4802 Spinal stenosis, cervical region: Secondary | ICD-10-CM | POA: Diagnosis not present

## 2020-03-23 DIAGNOSIS — R059 Cough, unspecified: Secondary | ICD-10-CM | POA: Diagnosis not present

## 2020-03-23 DIAGNOSIS — M4802 Spinal stenosis, cervical region: Secondary | ICD-10-CM | POA: Diagnosis not present

## 2020-03-24 DIAGNOSIS — Z6841 Body Mass Index (BMI) 40.0 and over, adult: Secondary | ICD-10-CM | POA: Diagnosis not present

## 2020-03-24 DIAGNOSIS — M4802 Spinal stenosis, cervical region: Secondary | ICD-10-CM | POA: Diagnosis not present

## 2020-03-24 DIAGNOSIS — M5412 Radiculopathy, cervical region: Secondary | ICD-10-CM | POA: Diagnosis not present

## 2020-03-24 DIAGNOSIS — G4733 Obstructive sleep apnea (adult) (pediatric): Secondary | ICD-10-CM | POA: Diagnosis not present

## 2020-03-24 DIAGNOSIS — K219 Gastro-esophageal reflux disease without esophagitis: Secondary | ICD-10-CM | POA: Diagnosis not present

## 2020-03-24 DIAGNOSIS — Z87891 Personal history of nicotine dependence: Secondary | ICD-10-CM | POA: Diagnosis not present

## 2020-03-24 DIAGNOSIS — G959 Disease of spinal cord, unspecified: Secondary | ICD-10-CM | POA: Diagnosis not present

## 2020-03-24 DIAGNOSIS — M5000 Cervical disc disorder with myelopathy, unspecified cervical region: Secondary | ICD-10-CM | POA: Diagnosis not present

## 2020-03-24 DIAGNOSIS — G992 Myelopathy in diseases classified elsewhere: Secondary | ICD-10-CM | POA: Diagnosis not present

## 2020-03-24 DIAGNOSIS — Z86711 Personal history of pulmonary embolism: Secondary | ICD-10-CM | POA: Diagnosis not present

## 2020-03-27 ENCOUNTER — Other Ambulatory Visit: Payer: Self-pay

## 2020-03-27 NOTE — Patient Outreach (Signed)
[place] [facility]) Care Management  03/27/2020  Nicole Giles 1945/06/05 [id number]   Referral Date: 03/27/20 Referral Source: [employer] Report Date of Discharge: 03/26/20 Facility: [place] attempt: Spoke with patient she reports doing ok after getting home last night. She is utilizing her walker for ambulation and is independent with care at this time. Patient has restrictions of no lifting over 5 lbs and no bending.  Discussed post-op back care with patient. Patient has follow up with surgeon and PCP. She states she will get family to transport  Social: Patient lives alone but has support from her family.  She states her granddaughter is there with her right now for assistance.    Conditions:Patient with past medical history of Diabetes mellitus, DVT (deep venous thrombosis), fatty liver, GERD, Hyperlipidemia, migraines, pulmonary Embolism, and sleep apnea.  Patient recent underwent surgery to her back due to Cervical stenosis.  Home health has been ordered through [facility] and patient has contact information.     Medications:Patient has all her medications and able to review on call.  No discrepancies noted.  Appointments:  Consent: RN CM reviewed [facility] services with patient. Patient declined further follow up at this time.    Plan: RN CM will send letter and close case.     Jone Baseman, RN, MSN [facility] Care Management Care Management Coordinator Direct Line [phone] Toll Free: [phone]  Fax: [phone]

## 2020-03-31 ENCOUNTER — Ambulatory Visit (INDEPENDENT_AMBULATORY_CARE_PROVIDER_SITE_OTHER): Payer: Medicare HMO | Admitting: Physician Assistant

## 2020-03-31 DIAGNOSIS — Z5329 Procedure and treatment not carried out because of patient's decision for other reasons: Secondary | ICD-10-CM

## 2020-03-31 NOTE — Progress Notes (Signed)
Patient ID: Nicole Giles, female   DOB: 1946-02-01, 74 y.o.   MRN: [id number] No show.

## 2020-04-04 DIAGNOSIS — K760 Fatty (change of) liver, not elsewhere classified: Secondary | ICD-10-CM | POA: Diagnosis not present

## 2020-04-04 DIAGNOSIS — E119 Type 2 diabetes mellitus without complications: Secondary | ICD-10-CM | POA: Diagnosis not present

## 2020-04-04 DIAGNOSIS — M4802 Spinal stenosis, cervical region: Secondary | ICD-10-CM | POA: Diagnosis not present

## 2020-04-04 DIAGNOSIS — G43909 Migraine, unspecified, not intractable, without status migrainosus: Secondary | ICD-10-CM | POA: Diagnosis not present

## 2020-04-04 DIAGNOSIS — M4712 Other spondylosis with myelopathy, cervical region: Secondary | ICD-10-CM | POA: Diagnosis not present

## 2020-04-04 DIAGNOSIS — G4733 Obstructive sleep apnea (adult) (pediatric): Secondary | ICD-10-CM | POA: Diagnosis not present

## 2020-04-04 DIAGNOSIS — M5412 Radiculopathy, cervical region: Secondary | ICD-10-CM | POA: Diagnosis not present

## 2020-04-04 DIAGNOSIS — M1990 Unspecified osteoarthritis, unspecified site: Secondary | ICD-10-CM | POA: Diagnosis not present

## 2020-04-04 DIAGNOSIS — Z4789 Encounter for other orthopedic aftercare: Secondary | ICD-10-CM | POA: Diagnosis not present

## 2020-04-09 DIAGNOSIS — I10 Essential (primary) hypertension: Secondary | ICD-10-CM | POA: Diagnosis not present

## 2020-04-09 DIAGNOSIS — E119 Type 2 diabetes mellitus without complications: Secondary | ICD-10-CM | POA: Diagnosis not present

## 2020-04-09 DIAGNOSIS — I088 Other rheumatic multiple valve diseases: Secondary | ICD-10-CM | POA: Diagnosis not present

## 2020-04-09 DIAGNOSIS — R0600 Dyspnea, unspecified: Secondary | ICD-10-CM | POA: Diagnosis not present

## 2020-04-09 DIAGNOSIS — R7401 Elevation of levels of liver transaminase levels: Secondary | ICD-10-CM | POA: Diagnosis not present

## 2020-04-09 DIAGNOSIS — I2699 Other pulmonary embolism without acute cor pulmonale: Secondary | ICD-10-CM | POA: Diagnosis not present

## 2020-04-09 DIAGNOSIS — R918 Other nonspecific abnormal finding of lung field: Secondary | ICD-10-CM | POA: Diagnosis not present

## 2020-04-09 DIAGNOSIS — R0902 Hypoxemia: Secondary | ICD-10-CM | POA: Diagnosis not present

## 2020-04-09 DIAGNOSIS — Z6841 Body Mass Index (BMI) 40.0 and over, adult: Secondary | ICD-10-CM | POA: Diagnosis not present

## 2020-04-09 DIAGNOSIS — I517 Cardiomegaly: Secondary | ICD-10-CM | POA: Diagnosis not present

## 2020-04-09 DIAGNOSIS — E7849 Other hyperlipidemia: Secondary | ICD-10-CM | POA: Diagnosis not present

## 2020-04-09 DIAGNOSIS — M7989 Other specified soft tissue disorders: Secondary | ICD-10-CM | POA: Diagnosis not present

## 2020-04-09 DIAGNOSIS — I2694 Multiple subsegmental pulmonary emboli without acute cor pulmonale: Secondary | ICD-10-CM | POA: Diagnosis not present

## 2020-04-09 DIAGNOSIS — Z20822 Contact with and (suspected) exposure to covid-19: Secondary | ICD-10-CM | POA: Diagnosis not present

## 2020-04-09 DIAGNOSIS — K760 Fatty (change of) liver, not elsewhere classified: Secondary | ICD-10-CM | POA: Diagnosis not present

## 2020-04-09 DIAGNOSIS — J9601 Acute respiratory failure with hypoxia: Secondary | ICD-10-CM | POA: Diagnosis not present

## 2020-04-09 DIAGNOSIS — R0689 Other abnormalities of breathing: Secondary | ICD-10-CM | POA: Diagnosis not present

## 2020-04-09 DIAGNOSIS — K219 Gastro-esophageal reflux disease without esophagitis: Secondary | ICD-10-CM | POA: Diagnosis not present

## 2020-04-09 DIAGNOSIS — R069 Unspecified abnormalities of breathing: Secondary | ICD-10-CM | POA: Diagnosis not present

## 2020-04-09 DIAGNOSIS — R0602 Shortness of breath: Secondary | ICD-10-CM | POA: Diagnosis not present

## 2020-04-10 DIAGNOSIS — I088 Other rheumatic multiple valve diseases: Secondary | ICD-10-CM | POA: Diagnosis not present

## 2020-04-10 DIAGNOSIS — I517 Cardiomegaly: Secondary | ICD-10-CM | POA: Diagnosis not present

## 2020-04-12 DIAGNOSIS — I2699 Other pulmonary embolism without acute cor pulmonale: Secondary | ICD-10-CM | POA: Diagnosis not present

## 2020-04-13 DIAGNOSIS — I2699 Other pulmonary embolism without acute cor pulmonale: Secondary | ICD-10-CM | POA: Diagnosis not present

## 2020-04-14 DIAGNOSIS — I2699 Other pulmonary embolism without acute cor pulmonale: Secondary | ICD-10-CM | POA: Diagnosis not present

## 2020-04-15 ENCOUNTER — Other Ambulatory Visit: Payer: Self-pay

## 2020-04-15 NOTE — Patient Outreach (Signed)
[place] [facility]) Care Management  04/15/2020  ARYN SAFRAN 06/16/1945 [id number]   Referral Date: 04/15/20  Referral Source: [employer] Report Date of Admission: 04/09/20 Diagnosis: Bilateral Pulmonary Embolism Date of Discharge: 04/14/20 Facility: [place] attempt:No answer.  HIPAA compliant voice message left.    Plan: RN CM will wait return call. If no return call will attempt again within 4 business days and send a letter.   Jone Baseman, RN, MSN [facility] Care Management Care Management Coordinator Direct Line [phone] Toll Free: [phone]  Fax: [phone]

## 2020-04-16 ENCOUNTER — Other Ambulatory Visit: Payer: Self-pay

## 2020-04-16 NOTE — Patient Outreach (Signed)
[place] [facility]) Care Management  04/16/2020  Nicole Giles Feb 01, 1946 [id number]   Referral Date: 04/15/20  Referral Source: [employer] Report Date of Admission: 04/09/20 Diagnosis: Bilateral Pulmonary Embolism Date of Discharge: 04/14/20 Facility: [place] attempt:No answer.  HIPAA compliant voice message left.    Plan: RN CM will wait return call. If no return call will attempt again within 4 business days.  Jone Baseman, RN, MSN [facility] Management Care Management Coordinator Direct Line [phone] Cell [phone] Toll Free: [phone]  Fax: [phone]

## 2020-04-17 ENCOUNTER — Other Ambulatory Visit: Payer: Self-pay

## 2020-04-17 DIAGNOSIS — M4802 Spinal stenosis, cervical region: Secondary | ICD-10-CM | POA: Diagnosis not present

## 2020-04-17 NOTE — Patient Outreach (Signed)
[place] [facility]) Care Management  04/17/2020  ALEXIUS ELLINGTON [birth year] [id number]   Referral Date:04/15/20 Referral Source:[employer] Report Date of Admission:04/09/20 Diagnosis:Bilateral Pulmonary Embolism Date of Discharge:04/14/20 Facility:[facility] Insurance:[employer]  Outreach attempt:No answer. HIPAA compliant voice message left.    Plan: RN CM willwait return call. If no return call will attempt again in the month of December.    Jone Baseman, RN, MSN [place] Management Care Management Coordinator Direct Line [phone] Cell [phone] Toll Free: [phone]  Fax: [phone]

## 2020-04-18 ENCOUNTER — Other Ambulatory Visit: Payer: Self-pay

## 2020-04-18 ENCOUNTER — Encounter: Payer: Self-pay | Admitting: Physician Assistant

## 2020-04-18 ENCOUNTER — Ambulatory Visit (INDEPENDENT_AMBULATORY_CARE_PROVIDER_SITE_OTHER): Payer: Medicare HMO | Admitting: Physician Assistant

## 2020-04-18 VITALS — BP 131/74 | HR 89 | Ht 59.0 in | Wt 207.0 lb

## 2020-04-18 DIAGNOSIS — Z8673 Personal history of transient ischemic attack (TIA), and cerebral infarction without residual deficits: Secondary | ICD-10-CM

## 2020-04-18 DIAGNOSIS — E1165 Type 2 diabetes mellitus with hyperglycemia: Secondary | ICD-10-CM | POA: Diagnosis not present

## 2020-04-18 DIAGNOSIS — Z86711 Personal history of pulmonary embolism: Secondary | ICD-10-CM

## 2020-04-18 DIAGNOSIS — M4802 Spinal stenosis, cervical region: Secondary | ICD-10-CM

## 2020-04-18 LAB — POCT GLYCOSYLATED HEMOGLOBIN (HGB A1C): Hemoglobin A1C: 6.6 % — AB (ref 4.0–5.6)

## 2020-04-18 MED ORDER — TRULICITY 0.75 MG/0.5ML ~~LOC~~ SOAJ: 0.7500 mg | SUBCUTANEOUS | 2 refills | Status: DC

## 2020-04-18 MED ORDER — APIXABAN 5 MG PO TABS
5.0000 mg | ORAL_TABLET | Freq: Two times a day (BID) | ORAL | 2 refills | Status: DC
Start: 2020-04-18 — End: 2020-05-08

## 2020-04-18 NOTE — Progress Notes (Addendum)
Established Patient Office Visit  Subjective:  Patient ID: Nicole Giles, female    DOB: 12-14-1945  Age: 74 y.o. MRN: [id number]  CC:  Chief Complaint  Patient presents with  . Hospitalization Follow-up    HPI Nicole Giles presents for hospital f/u for two separate encounters. PMH significant for hyperlipidemia, diabetes mellitus II, DVT, cervical spine arthritis, polymyalgia, OSA. She first went to ED on 03/19/2020 for TIA like symptoms and was found to have severe arthritic degeneration in her neck. She underwent c-spine discectomy with fusion on 03/24/2020. Pt presented to ED again on 04/09/2020 with SOB and was found to have bilateral pulmonary embolisms.   Patient says she is feeling well. She has appt scheduled for 05/23/2019 with neurosurgery. She saw neurosurgery NP yesterday, 04/18/2020, who said everything looked good and wrote her out of work for another week.  Patient reports that she is not taking ozempic because she can't afford it. She stopped taking it in September after two months. Patient is taking nexium, cough syrup, eliquis BID. Patient says that prior to going in the hospital she was only taking rosuvastatin a couple times a week but since being in the hospital she has been taking it every day with no adverse effects. Patient is not using CPAP machine at night because she doesn't like mask.  Patient asking about home health assistance at today's visit. She is concerned about not being able to clean her house due to restrictions on her bending and turning her neck.  HM: Patient is UTD with eye exam (03/19/2020).    Past Medical History:  Diagnosis Date  . GERD (gastroesophageal reflux disease)   . H/O blood clots    in left leg after left arm surgery in 05/2006 ----- off coumadin  . Morbid obesity ([place]) 11/17/2006   Qualifier: Diagnosis of  By: Madilyn Fireman MD, Barnetta Chapel    . OBSTRUCTIVE SLEEP APNEA 09/21/2007   Qualifier: Diagnosis of  By: Madilyn Fireman MD, Barnetta Chapel     . Polymyalgia ([place]) 08/11/2016    Past Surgical History:  Procedure Laterality Date  . CHOLECYSTECTOMY    . CYSTECTOMY    . left arm fracture    . NECK SURGERY     cyst removal  . right knee surgery      Family History  Problem Relation Age of Onset  . Prostate cancer Other   . Diabetes Other   . Diabetes Brother     Social History   Socioeconomic History  . Marital status: Single    Spouse name: Not on file  . Number of children: Not on file  . Years of education: Not on file  . Highest education level: Not on file  Occupational History  . Not on file  Tobacco Use  . Smoking status: Former Smoker    Quit date: 03/25/1972    Years since quitting: 48.0  . Smokeless tobacco: Never Used  Vaping Use  . Vaping Use: Never used  Substance and Sexual Activity  . Alcohol use: No  . Drug use: No  . Sexual activity: Never  Other Topics Concern  . Not on file  Social History Narrative  . Not on file   Social Determinants of Health   Financial Resource Strain: Not on file  Food Insecurity: Not on file  Transportation Needs: Not on file  Physical Activity: Not on file  Stress: Not on file  Social Connections: Not on file  Intimate Partner Violence: Not on file  Outpatient Medications Prior to Visit  Medication Sig Dispense Refill  . apixaban (ELIQUIS) 5 MG TABS tablet 10 mg (2 tablets) oral 2 times a day x 2 days , Starting 04/17/2020 begin 5 mg (1 tablet) 2 times a day for completion of therapy duration.    [name] dextromethorphan (DELSYM) 30 MG/5ML liquid Take by mouth.    . Blood Glucose Monitoring Suppl (TRUE METRIX METER) w/Device KIT 1 Device by Does not apply route daily. Check blood sugar every morning Dx DM E11.65. 1 kit 0  . cyclobenzaprine (FLEXERIL) 10 MG tablet Take by mouth.    . diclofenac sodium (VOLTAREN) 1 % GEL Apply 4 g topically 4 (four) times daily. To affected joint. 100 g 11  . esomeprazole (NEXIUM) 40 MG capsule Take 1 capsule (40 mg total) by  mouth daily at 12 noon. 90 capsule 3  . glucose blood test strip Use as instructed Dx DM E11.65. 100 each 12  . HYDROcodone-acetaminophen (NORCO/VICODIN) 5-325 MG tablet Take by mouth.    [name] ibuprofen (ADVIL,MOTRIN) 800 MG tablet Take 1 tablet (800 mg total) by mouth every 8 (eight) hours as needed. (Patient not taking: Reported on 03/27/2020) 60 tablet 0  . Lancets (ONETOUCH ULTRASOFT) lancets Dx DM E11.65. Check fasting blood sugar every morning. 100 each 12  . phenol (CHLORASEPTIC) 1.4 % LIQD Use as directed two sprays in the mouth or throat every 4 (four) hours as needed.    . rosuvastatin (CRESTOR) 5 MG tablet Take 1 tablet (5 mg total) by mouth daily. 90 tablet 3  . Semaglutide,0.25 or 0.5MG/DOS, (OZEMPIC, 0.25 OR 0.5 MG/DOSE,) 2 MG/1.5ML SOPN Inject 0.375 mLs (0.5 mg total) into the skin once a week. (Patient not taking: Reported on 03/27/2020) 2 pen 5  . triamcinolone cream (KENALOG) 0.1 % Apply 1 application topically 2 (two) times daily. 80 g 0  . aspirin 325 MG tablet Take by mouth.     No facility-administered medications prior to visit.    Allergies  Allergen Reactions  . Codeine   . Lipitor [Atorvastatin Calcium]     myaglia  . Meperidine Hcl     ROS Review of Systems  Constitutional: Negative for fever.  Respiratory: Negative for cough, chest tightness and shortness of breath.   Cardiovascular: Positive for leg swelling (mild swelling, non pitting edema). Negative for chest pain.  Musculoskeletal: Negative for neck pain (wearing soft C-collar during visit) and neck stiffness.  Neurological: Negative for weakness and numbness.  Hematological: Does not bruise/bleed easily.      Objective:    Physical Exam Constitutional:      General: She is not in acute distress.    Appearance: Normal appearance.  Neck:     Vascular: No carotid bruit.      Comments: Surgical incision  Cardiovascular:     Rate and Rhythm: Regular rhythm.     Heart sounds: Normal heart sounds.   Pulmonary:     Breath sounds: Normal breath sounds.  Musculoskeletal:     Right lower leg: No edema.     Left lower leg: No edema.  Psychiatric:        Mood and Affect: Mood normal.     BP 131/74   Pulse 89   Ht 4' 11" (1.499 m)   Wt 207 lb (93.9 kg)   SpO2 93%   BMI 41.81 kg/m  Wt Readings from Last 3 Encounters:  04/18/20 207 lb (93.9 kg)  10/30/19 206 lb (93.4 kg)  03/26/19 209 lb (  94.8 kg)     Health Maintenance Due  Topic Date Due  . FOOT EXAM  12/15/2019  . URINE MICROALBUMIN  05/31/2020    There are no preventive care reminders to display for this patient.    Assessment & Plan:  Diabetes Mellitus T2 - Concerned for patient's sugar levels as she has not been taking anything since September and A1C was 6.6 in office today. Plan to start trulicity. Instructed patient to reach out/follow up if this medication is not affordable as she needs to be on something to treat her hyperglycemia.  Hyperlipidemia - Patient had significant myalgias with lipitor which is why crestor dose was started at low dose (77m). Start 510mcrestor every day and f/u if there are any SEs.  Hx of DVT and PE - Patient is already being seen by hematology for hemochromatosis. Encouraged patient to f/u there to get input on how long to continue eliquis for. I feel like pt may need to stay on eliquis a little longer due to her history or at least always go on it after surgeries since this is the 2nd time blood clots have formed after surgery. Informed patient that eliquis will need to be taken for at least 3 months but hematology may want it to be taken longer. S/p cervical discectomy with fusion due to cervical stenosis - Patient is approximately 3.5 weeks out from this procedure and is progressing well according to neurosurgery. Continue f/u as instructed. - Bending/lifting/twisting restrictions are preventing patient from performing necessary tasks around her house where she lives alone. Provided  contact information for [facility]  Had long conversation with patient about importance of reducing risk factors (hypertension, obesity, hyperlipidemia, elevated A1C) for prevention of further thrombotic/embolic events. Encouraged weight loss via low carb/balanced diet. Made sure that patient had enough eliquis available and encouraged f/u with hematology. Reviewed symptoms of TIA with patient and instructed her to seek immediate care if she is experiencing any. Overall patient seems to be doing as well as expected given events of the last 6 weeks.  Follow-up: Follow up as needed for any new or worsening symptoms or in 3 months.     ..Marland KitchenVernetta HoneyA-C, have reviewed and agree with the above documentation in it's entirety.

## 2020-04-18 NOTE — Patient Instructions (Signed)
[facility] [place] [facility] [phone] Located in [place]

## 2020-04-21 ENCOUNTER — Encounter: Payer: Self-pay | Admitting: Physician Assistant

## 2020-04-21 DIAGNOSIS — Z8673 Personal history of transient ischemic attack (TIA), and cerebral infarction without residual deficits: Secondary | ICD-10-CM | POA: Insufficient documentation

## 2020-04-21 DIAGNOSIS — M4802 Spinal stenosis, cervical region: Secondary | ICD-10-CM | POA: Insufficient documentation

## 2020-04-23 DIAGNOSIS — I2699 Other pulmonary embolism without acute cor pulmonale: Secondary | ICD-10-CM | POA: Diagnosis not present

## 2020-04-23 DIAGNOSIS — Z6841 Body Mass Index (BMI) 40.0 and over, adult: Secondary | ICD-10-CM | POA: Diagnosis not present

## 2020-04-23 DIAGNOSIS — Z09 Encounter for follow-up examination after completed treatment for conditions other than malignant neoplasm: Secondary | ICD-10-CM | POA: Diagnosis not present

## 2020-04-23 DIAGNOSIS — D7589 Other specified diseases of blood and blood-forming organs: Secondary | ICD-10-CM | POA: Diagnosis not present

## 2020-04-23 DIAGNOSIS — Z86711 Personal history of pulmonary embolism: Secondary | ICD-10-CM | POA: Diagnosis not present

## 2020-04-23 DIAGNOSIS — Z7902 Long term (current) use of antithrombotics/antiplatelets: Secondary | ICD-10-CM | POA: Diagnosis not present

## 2020-04-23 DIAGNOSIS — R7989 Other specified abnormal findings of blood chemistry: Secondary | ICD-10-CM | POA: Diagnosis not present

## 2020-05-06 ENCOUNTER — Other Ambulatory Visit: Payer: Self-pay

## 2020-05-06 NOTE — Patient Outreach (Signed)
[facility]) Care Management  05/06/2020  Nicole Giles 10-12-1945 [id number]   Referral Date:04/15/20 Referral Source:[employer] Report Date of Admission:04/09/20 Diagnosis:Bilateral Pulmonary Embolism Date of Discharge:04/14/20 Facility:[facility] Insurance:[employer]  Outreach attempt:No answer. HIPAA compliant voice message left.    Plan: RN CM willclose case.   Bary Leriche, RN, MSN [facility] Care Management Care Management Coordinator Direct Line [phone] Cell [phone] Toll Free: [phone]  Fax: [phone]

## 2020-05-08 ENCOUNTER — Other Ambulatory Visit: Payer: Self-pay

## 2020-05-08 ENCOUNTER — Ambulatory Visit (INDEPENDENT_AMBULATORY_CARE_PROVIDER_SITE_OTHER): Payer: Medicare HMO | Admitting: Family Medicine

## 2020-05-08 ENCOUNTER — Encounter: Payer: Self-pay | Admitting: Family Medicine

## 2020-05-08 ENCOUNTER — Ambulatory Visit: Payer: Medicare HMO | Admitting: Family Medicine

## 2020-05-08 VITALS — BP 132/78 | HR 87 | Ht 59.0 in | Wt 210.0 lb

## 2020-05-08 DIAGNOSIS — M25472 Effusion, left ankle: Secondary | ICD-10-CM

## 2020-05-08 DIAGNOSIS — N3000 Acute cystitis without hematuria: Secondary | ICD-10-CM | POA: Diagnosis not present

## 2020-05-08 DIAGNOSIS — R350 Frequency of micturition: Secondary | ICD-10-CM | POA: Diagnosis not present

## 2020-05-08 LAB — POCT URINALYSIS DIP (CLINITEK)
Bilirubin, UA: NEGATIVE
Glucose, UA: NEGATIVE mg/dL
Ketones, POC UA: NEGATIVE mg/dL
Nitrite, UA: NEGATIVE
POC PROTEIN,UA: NEGATIVE
Spec Grav, UA: <=1.005 — AB (ref 1.010–1.025)
Urobilinogen, UA: 0.2 E.U./dL
pH, UA: 6 (ref 5.0–8.0)

## 2020-05-08 MED ORDER — NITROFURANTOIN MONOHYD MACRO 100 MG PO CAPS: 100.0000 mg | ORAL_CAPSULE | Freq: Two times a day (BID) | ORAL | 0 refills | Status: DC

## 2020-05-08 NOTE — Progress Notes (Signed)
Acute Office Visit  Subjective:    Patient ID: Nicole Giles, female    DOB: 1945-05-17, 74 y.o.   MRN: [id number]  Chief Complaint  Patient presents with  . Urinary Tract Infection    HPI Patient is in today for Urinary frequency and odor.  No fever, sweats or chills.  No vomiting or back pain.  He was not catheterized during her surgery last month.  She had cervical spine surgery on 03/24/2020 she still has a lingering  Cough. She says it is dry but it is keeping her awake at night.  She is taking her cough syrup twice a day.  Not been able to wear her CPAP consistently.  Also complains of some left ankle swelling that she has noticed over the last week or so.  She says it is mild she not getting any pain in that calf or leg.  She is already on Eliquis 5 mg daily she has been more sedentary since her surgery.  She is really not able to do more than 20 minutes of activity or work at a time before she has to sit down and rest.  Past Medical History:  Diagnosis Date  . GERD (gastroesophageal reflux disease)   . H/O blood clots    in left leg after left arm surgery in 05/2006 ----- off coumadin  . Morbid obesity ([place]) 11/17/2006   Qualifier: Diagnosis of  By: Madilyn Fireman MD, Barnetta Chapel    . OBSTRUCTIVE SLEEP APNEA 09/21/2007   Qualifier: Diagnosis of  By: Madilyn Fireman MD, Barnetta Chapel    . Polymyalgia ([place]) 08/11/2016    Past Surgical History:  Procedure Laterality Date  . CHOLECYSTECTOMY    . CYSTECTOMY    . left arm fracture    . NECK SURGERY     cyst removal  . right knee surgery      Family History  Problem Relation Age of Onset  . Prostate cancer Other   . Diabetes Other   . Diabetes Brother     Social History   Socioeconomic History  . Marital status: Single    Spouse name: Not on file  . Number of children: Not on file  . Years of education: Not on file  . Highest education level: Not on file  Occupational History  . Not on file  Tobacco Use  . Smoking status: Former  Smoker    Quit date: 03/25/1972    Years since quitting: 48.1  . Smokeless tobacco: Never Used  Vaping Use  . Vaping Use: Never used  Substance and Sexual Activity  . Alcohol use: No  . Drug use: No  . Sexual activity: Never  Other Topics Concern  . Not on file  Social History Narrative  . Not on file   Social Determinants of Health   Financial Resource Strain: Not on file  Food Insecurity: Not on file  Transportation Needs: Not on file  Physical Activity: Not on file  Stress: Not on file  Social Connections: Not on file  Intimate Partner Violence: Not on file    Outpatient Medications Prior to Visit  Medication Sig Dispense Refill  . apixaban (ELIQUIS) 5 MG TABS tablet Take by mouth.    . Blood Glucose Monitoring Suppl (TRUE METRIX METER) w/Device KIT 1 Device by Does not apply route daily. Check blood sugar every morning Dx DM E11.65. 1 kit 0  . Dulaglutide (TRULICITY) 5.79 UX/8.3FX SOPN Inject 0.75 mg into the skin once a week. 2 mL 2  .  esomeprazole (NEXIUM) 40 MG capsule Take 1 capsule (40 mg total) by mouth daily at 12 noon. 90 capsule 3  . glucose blood test strip Use as instructed Dx DM E11.65. 100 each 12  . Lancets (ONETOUCH ULTRASOFT) lancets Dx DM E11.65. Check fasting blood sugar every morning. 100 each 12  . rosuvastatin (CRESTOR) 5 MG tablet Take 1 tablet (5 mg total) by mouth daily. 90 tablet 3  . apixaban (ELIQUIS) 5 MG TABS tablet 10 mg (2 tablets) oral 2 times a day x 2 days , Starting 04/17/2020 begin 5 mg (1 tablet) 2 times a day for completion of therapy duration.    [name] apixaban (ELIQUIS) 5 MG TABS tablet Take 1 tablet (5 mg total) by mouth 2 (two) times daily. 60 tablet 2  . cyclobenzaprine (FLEXERIL) 10 MG tablet Take by mouth.    . diclofenac sodium (VOLTAREN) 1 % GEL Apply 4 g topically 4 (four) times daily. To affected joint. 100 g 11  . HYDROcodone-acetaminophen (NORCO/VICODIN) 5-325 MG tablet Take by mouth.    . phenol (CHLORASEPTIC) 1.4 % LIQD Use  as directed two sprays in the mouth or throat every 4 (four) hours as needed.    . triamcinolone cream (KENALOG) 0.1 % Apply 1 application topically 2 (two) times daily. 80 g 0   No facility-administered medications prior to visit.    Allergies  Allergen Reactions  . Codeine   . Lipitor [Atorvastatin Calcium]     myaglia  . Meperidine Hcl     Review of Systems     Objective:    Physical Exam Vitals reviewed.  Constitutional:      Appearance: She is well-developed and well-nourished.  HENT:     Head: Normocephalic and atraumatic.  Eyes:     Extraocular Movements: EOM normal.     Conjunctiva/sclera: Conjunctivae normal.  Cardiovascular:     Rate and Rhythm: Normal rate.  Pulmonary:     Effort: Pulmonary effort is normal.  Musculoskeletal:     Comments: Trace edema of the left ankle.  With just trace pitting.  Skin:    General: Skin is dry.     Coloration: Skin is not pale.  Neurological:     Mental Status: She is alert and oriented to person, place, and time.  Psychiatric:        Mood and Affect: Mood and affect normal.        Behavior: Behavior normal.     BP 132/78   Pulse 87   Ht 4' 11"  (1.499 m)   Wt 210 lb (95.3 kg)   SpO2 96%   BMI 42.41 kg/m  Wt Readings from Last 3 Encounters:  05/08/20 210 lb (95.3 kg)  04/18/20 207 lb (93.9 kg)  10/30/19 206 lb (93.4 kg)    Health Maintenance Due  Topic Date Due  . COVID-19 Vaccine (2 - Moderna risk 4-dose series) 10/22/2019  . FOOT EXAM  12/15/2019  . MAMMOGRAM  05/01/2020  . URINE MICROALBUMIN  05/31/2020    There are no preventive care reminders to display for this patient.   Lab Results  Component Value Date   TSH 3.34 09/16/2016   Lab Results  Component Value Date   WBC 6.9 02/04/2017   HGB 13.6 02/04/2017   HCT 39.4 02/04/2017   MCV 97.5 02/04/2017   PLT 287 02/04/2017   Lab Results  Component Value Date   NA 138 04/04/2018   K 4.5 04/04/2018   CO2 26 04/04/2018   GLUCOSE  125 (H)  04/04/2018   BUN 13 04/04/2018   CREATININE 0.63 04/04/2018   BILITOT 0.8 04/04/2018   ALKPHOS 100 08/09/2016   AST 53 (H) 04/04/2018   ALT 49 (H) 04/04/2018   PROT 6.5 04/04/2018   ALBUMIN 3.8 08/09/2016   CALCIUM 9.7 04/04/2018   Lab Results  Component Value Date   CHOL 210 (H) 04/04/2018   Lab Results  Component Value Date   HDL 37 (L) 04/04/2018   Lab Results  Component Value Date   LDLCALC 139 (H) 04/04/2018   Lab Results  Component Value Date   TRIG 199 (H) 04/04/2018   Lab Results  Component Value Date   CHOLHDL 5.7 (H) 04/04/2018   Lab Results  Component Value Date   HGBA1C 6.6 (A) 04/18/2020       Assessment & Plan:   Problem List Items Addressed This Visit   None   Visit Diagnoses    Frequency of urination    -  Primary   Relevant Orders   POCT URINALYSIS DIP (CLINITEK) (Completed)   Acute cystitis without hematuria       Left ankle swelling         Urinary tract infection-urinalysis positive for blood and leukocytes we will go ahead and treat with Macrobid will send for culture for confirmation that she just had a UTI about 6 weeks ago she says she was diagnosed with one during her hospitalization.  Left ankle swelling-I think is just from being more sedentary since her surgery.  She not having any pain or discomfort and the swelling is mild.  We did discuss further work-up if she starts to get pain in her calf or the swelling gets worse.  She is already on Eliquis so DVT is less likely.  She never received her second Covid vaccine so encouraged her to get that scheduled.  Cough status post cervical surgery-discussed that this is typical.  No worsening symptoms or red flag symptoms at this point in time.  Did encourage her to use a humidifier at night on her nightstand especially if she starts to mouth breathe and she is not able to wear her CPAP right now.  Okay to continue cough syrup as well.  And trying to get in the right position to decrease  any drainage or postnasal drip.  Meds ordered this encounter  Medications  . nitrofurantoin, macrocrystal-monohydrate, (MACROBID) 100 MG capsule    Sig: Take 1 capsule (100 mg total) by mouth 2 (two) times daily.    Dispense:  10 capsule    Refill:  0     Beatrice Lecher, MD

## 2020-05-22 DIAGNOSIS — M4802 Spinal stenosis, cervical region: Secondary | ICD-10-CM | POA: Diagnosis not present

## 2020-05-22 DIAGNOSIS — Z981 Arthrodesis status: Secondary | ICD-10-CM | POA: Diagnosis not present

## 2020-06-04 ENCOUNTER — Ambulatory Visit (INDEPENDENT_AMBULATORY_CARE_PROVIDER_SITE_OTHER): Payer: Medicare HMO | Admitting: Physician Assistant

## 2020-06-04 DIAGNOSIS — Z0000 Encounter for general adult medical examination without abnormal findings: Secondary | ICD-10-CM

## 2020-06-04 NOTE — Progress Notes (Signed)
MEDICARE ANNUAL WELLNESS VISIT  06/04/2020  Telephone Visit Disclaimer This Medicare AWV was conducted by telephone due to national recommendations for restrictions regarding the COVID-19 Pandemic (e.g. social distancing).  I verified, using two identifiers, that I am speaking with Nicole Giles or their authorized healthcare agent. I discussed the limitations, risks, security, and privacy concerns of performing an evaluation and management service by telephone and the potential availability of an in-person appointment in the future. The patient expressed understanding and agreed to proceed.  Location of Patient: [facility] Location of Provider (nurse):  In the office.  Subjective:    Nicole Giles is a 75 y.o. female patient of Alden Hipp, Royetta Car, PA-C who had a Medicare Annual Wellness Visit today via telephone. Nicole Giles is Working full time (limited work due to lifting restrictions) and lives alone. she has 3 children (two living and one deceased). she reports that she is socially active and does interact with friends/family regularly. she is minimally physically active and enjoys watching tv.  Patient Care Team: Lavada Mesi as PCP - General (Family Medicine)  Advanced Directives 06/04/2020 03/27/2020  Does Patient Have a Medical Advance Directive? No No  Would patient like information on creating a medical advance directive? No - Patient declined No - Patient declined    Hospital Utilization Over the Past 12 Months: # of hospitalizations or ER visits: 1 # of surgeries: 1  Review of Systems    Patient reports that her overall health is worse compared to last year.  History obtained from chart review and the patient  Patient Reported Readings (BP, Pulse, CBG, Weight, etc) none  Pain Assessment Pain : 0-10 Pain Score: 5  Pain Type: Acute pain Pain Location: Head Pain Radiating Towards: neck and back (due to surgery in november) Pain Descriptors / Indicators:  Aching Pain Onset: More than a month ago Pain Frequency: Intermittent Pain Relieving Factors: tyelnol Effect of Pain on Daily Activities: none  Pain Relieving Factors: tyelnol  Current Medications & Allergies (verified) Allergies as of 06/04/2020      Reactions   Codeine    Lipitor [atorvastatin Calcium]    myaglia   Meperidine Hcl       Medication List       Accurate as of June 04, 2020 11:20 AM. If you have any questions, ask your nurse or doctor.        apixaban 5 MG Tabs tablet Commonly known as: ELIQUIS Take by mouth.   esomeprazole 40 MG capsule Commonly known as: NexIUM Take 1 capsule (40 mg total) by mouth daily at 12 noon.   glucose blood test strip Use as instructed Dx DM E11.65.   nitrofurantoin (macrocrystal-monohydrate) 100 MG capsule Commonly known as: Macrobid Take 1 capsule (100 mg total) by mouth 2 (two) times daily.   onetouch ultrasoft lancets Dx DM E11.65. Check fasting blood sugar every morning.   rosuvastatin 5 MG tablet Commonly known as: Crestor Take 1 tablet (5 mg total) by mouth daily.   True Metrix Meter w/Device Kit 1 Device by Does not apply route daily. Check blood sugar every morning Dx DM E11.65.   Trulicity 1.47 WG/9.5AO Sopn Generic drug: Dulaglutide Inject 0.75 mg into the skin once a week.       History (reviewed): Past Medical History:  Diagnosis Date  . GERD (gastroesophageal reflux disease)   . H/O blood clots    in left leg after left arm surgery in 05/2006 ----- off coumadin  .  Morbid obesity ([place]) 11/17/2006   Qualifier: Diagnosis of  By: Madilyn Fireman MD, Barnetta Chapel    . OBSTRUCTIVE SLEEP APNEA 09/21/2007   Qualifier: Diagnosis of  By: Madilyn Fireman MD, Barnetta Chapel    . Polymyalgia ([place]) 08/11/2016   Past Surgical History:  Procedure Laterality Date  . CHOLECYSTECTOMY    . CYSTECTOMY    . left arm fracture    . NECK SURGERY     cyst removal  . right knee surgery     Family History  Problem Relation Age of Onset   . Prostate cancer Other   . Diabetes Other   . Diabetes Brother    Social History   Socioeconomic History  . Marital status: Single    Spouse name: Not on file  . Number of children: 3  . Years of education: 47  . Highest education level: Associate degree: occupational, Hotel manager, or vocational program  Occupational History  . Occupation: Health and safety inspector    Comment: [employer]  Tobacco Use  . Smoking status: Former Smoker    Quit date: 03/25/1972    Years since quitting: 48.2  . Smokeless tobacco: Never Used  Vaping Use  . Vaping Use: Never used  Substance and Sexual Activity  . Alcohol use: No  . Drug use: No  . Sexual activity: Never  Other Topics Concern  . Not on file  Social History Narrative   Lives alone. Recently had neck surgery, she is not able to lift over 5 lbs; she can't look at the computer for a long time without her neck aching or headaches. She is not able to do much exercise due to that. She has two living children, and one passed away in 03/29/19.   Social Determinants of Health   Financial Resource Strain: Low Risk   . Difficulty of Paying Living Expenses: Not hard at all  Food Insecurity: No Food Insecurity  . Worried About Charity fundraiser in the Last Year: Never true  . Ran Out of Food in the Last Year: Never true  Transportation Needs: No Transportation Needs  . Lack of Transportation (Medical): No  . Lack of Transportation (Non-Medical): No  Physical Activity: Inactive  . Days of Exercise per Week: 0 days  . Minutes of Exercise per Session: 0 min  Stress: No Stress Concern Present  . Feeling of Stress : Only a little  Social Connections: Socially Isolated  . Frequency of Communication with Friends and Family: More than three times a week  . Frequency of Social Gatherings with Friends and Family: More than three times a week  . Attends Religious Services: Never  . Active Member of Clubs or Organizations: No  . Attends Theatre manager Meetings: Never  . Marital Status: Divorced    Activities of Daily Living In your present state of health, do you have any difficulty performing the following activities: 06/04/2020  Hearing? Y  Comment unable to afford the hearing aids. had a hearing exam 2020.  Vision? Y  Comment she is waiting to have cataract surgery.  Difficulty concentrating or making decisions? N  Walking or climbing stairs? Y  Comment due to her right knee issues; she has trouble climbing stairs.  Dressing or bathing? N  Doing errands, shopping? Y  Comment due to her lifting restrictions; she is not able to go shopping alone  Preparing Food and eating ? N  Using the Toilet? N  In the past six months, have you accidently leaked urine? Y  Comment  she doesn't make it to the bathroom on time  Do you have problems with loss of bowel control? N  Managing your Medications? N  Managing your Finances? N  Housekeeping or managing your Housekeeping? Y  Comment due to her surgery and lifting restrictions, you are not to keep up with housekeeping  Some recent data might be hidden    Patient Education/ Literacy How often do you need to have someone help you when you read instructions, pamphlets, or other written materials from your doctor or pharmacy?: 1 - Never What is the last grade level you completed in school?: Associate Degree  Exercise Current Exercise Habits: The patient does not participate in regular exercise at present, Exercise limited by: orthopedic condition(s)  Diet Patient reports consuming 1 meals a day and 2 snack(s) a day Patient reports that her primary diet is: Regular Patient reports that she does have regular access to food.   Depression Screen PHQ 2/9 Scores 06/04/2020 10/30/2019 04/04/2018 03/13/2018 01/14/2017 09/16/2016  PHQ - 2 Score 0 0 0 1 0 0  PHQ- 9 Score 0 7 9 13  - 10     Fall Risk Fall Risk  06/04/2020 10/30/2019 04/09/2018  Falls in the past year? 0 0 0  Number falls in  past yr: 0 0 -  Injury with Fall? 0 0 -  Risk for fall due to : No Fall Risks - -  Follow up Falls evaluation completed Falls evaluation completed Falls evaluation completed;Education provided     Objective:  Nicole Giles seemed alert and oriented and she participated appropriately during our telephone visit.  Blood Pressure Weight BMI  BP Readings from Last 3 Encounters:  05/08/20 132/78  04/18/20 131/74  10/30/19 133/71   Wt Readings from Last 3 Encounters:  05/08/20 210 lb (95.3 kg)  04/18/20 207 lb (93.9 kg)  10/30/19 206 lb (93.4 kg)   BMI Readings from Last 1 Encounters:  05/08/20 42.41 kg/m    *Unable to obtain current vital signs, weight, and BMI due to telephone visit type  Hearing/Vision  . Nicole Giles did not seem to have difficulty with hearing/understanding during the telephone conversation . Reports that she has had a formal eye exam by an eye care professional within the past year . Reports that she has not had a formal hearing evaluation within the past year *Unable to fully assess hearing and vision during telephone visit type  Cognitive Function: 6CIT Screen 06/04/2020 04/04/2018  What Year? 0 points 0 points  What month? 0 points 0 points  What time? 0 points 0 points  Count back from 20 0 points 0 points  Months in reverse 2 points 0 points  Repeat phrase 2 points 4 points  Total Score 4 4   (Normal:0-7, Significant for Dysfunction: >8)  Normal Cognitive Function Screening: Yes   Immunization & Health Maintenance Record Immunization History  Administered Date(s) Administered  . Moderna Sars-Covid-2 Vaccination 09/24/2019  . Tdap 04/04/2018    Health Maintenance  Topic Date Due  . COVID-19 Vaccine (2 - Moderna risk 4-dose series) 06/20/2020 (Originally 10/22/2019)  . URINE MICROALBUMIN  07/05/2020 (Originally 05/31/2020)  . INFLUENZA VACCINE  08/07/2020 (Originally 12/09/2019)  . MAMMOGRAM  08/07/2020 (Originally 05/01/2020)  . PNA vac Low Risk  Adult (1 of 2 - PCV13) 10/29/2020 (Originally 12/27/2012)  . FOOT EXAM  06/04/2021 (Originally 12/15/2019)  . HEMOGLOBIN A1C  10/17/2020  . OPHTHALMOLOGY EXAM  03/19/2021  . COLONOSCOPY (Pts 45-68yr Insurance coverage will need to be confirmed)  05/06/2023  . TETANUS/TDAP  04/04/2028  . DEXA SCAN  Completed  . Hepatitis C Screening  Completed       Assessment  This is a routine wellness examination for Nicole Giles.  Health Maintenance: Due or Overdue There are no preventive care reminders to display for this patient.  Nicole Giles does not need a referral for Community Assistance: Care Management:   no Social Work:    no Prescription Assistance:  no Nutrition/Diabetes Education:  no   Plan:  Personalized Goals Goals Addressed              This Visit's Progress   .  Patient Stated (pt-stated)        06/04/2020 AWV Goal: Exercise for General Health   Patient will verbalize understanding of the benefits of increased physical activity:  Exercising regularly is important. It will improve your overall fitness, flexibility, and endurance.  Regular exercise also will improve your overall health. It can help you control your weight, reduce stress, and improve your bone density.  Over the next year, patient will increase physical activity as tolerated with a goal of at least 150 minutes of moderate physical activity per week.   You can tell that you are exercising at a moderate intensity if your heart starts beating faster and you start breathing faster but can still hold a conversation.  Moderate-intensity exercise ideas include:  Walking 1 mile (1.6 km) in about 15 minutes  Biking  Hiking  Golfing  Dancing  Water aerobics  Patient will verbalize understanding of everyday activities that increase physical activity by providing examples like the following: ? Yard work, such as: ? Pushing a Conservation officer, nature ? Raking and bagging leaves ? Washing your car ? Pushing a  stroller ? Shoveling snow ? Gardening ? Washing windows or floors  Patient will be able to explain general safety guidelines for exercising:   Before you start a new exercise program, talk with your health care provider.  Do not exercise so much that you hurt yourself, feel dizzy, or get very short of breath.  Wear comfortable clothes and wear shoes with good support.  Drink plenty of water while you exercise to prevent dehydration or heat stroke.  Work out until your breathing and your heartbeat get faster.   06/04/2020  AWV Goal: Diabetes Management   . Patient will maintain an A1C level below 8.0 . Patient will not develop any diabetic foot complications . Patient will not experience any hypoglycemic episodes over the next 3 months . Patient will notify our office of any CBG readings outside of the provider recommended range by calling [phone] . Patient will adhere to provider recommendations for diabetes management    Patient Self Management Activities . take all medications as prescribed and report any negative side effects . monitor and record blood sugar readings as directed . adhere to a low carbohydrate diet that incorporates lean proteins, vegetables, whole grains, low glycemic fruits . check feet daily noting any sores, cracks, injuries, or callous formations . see PCP or podiatrist if she notices any changes in her legs, feet, or toenails . Patient will visit PCP and have an A1C level checked every 3 to 6 months as directed  . have a yearly eye exam to monitor for vascular changes associated with diabetes and will request that the report be sent to her pcp.  . consult with her PCP regarding any changes in her health or new or worsening symptoms  Personalized Health Maintenance & Screening Recommendations  Pneumococcal vaccine  Influenza vaccine Screening mammography Bone densitometry screening  Shingles vaccine  Lung Cancer Screening Recommended:  no (Low Dose CT Chest recommended if Age 54-80 years, 30 pack-year currently smoking OR have quit w/in past 15 years) Hepatitis C Screening recommended: no HIV Screening recommended: no  Advanced Directives: Written information was not prepared per patient's request.  Referrals & Orders Mammogram and Dexa scan referral can be sent after you discuss your concerns with Iran Planas, PA-C.  Follow-up Plan . Follow-up with Donella Stade, PA-C as planned . Schedule your appointment for shingles vaccine, pneumonia vaccine and covid vaccine.   I have personally reviewed and noted the following in the patient's chart:   . Medical and social history . Use of alcohol, tobacco or illicit drugs  . Current medications and supplements . Functional ability and status . Nutritional status . Physical activity . Advanced directives . List of other physicians . Hospitalizations, surgeries, and ER visits in previous 12 months . Vitals . Screenings to include cognitive, depression, and falls . Referrals and appointments  In addition, I have reviewed and discussed with Nicole Giles certain preventive protocols, quality metrics, and best practice recommendations. A written personalized care plan for preventive services as well as general preventive health recommendations is available and can be mailed to the patient at her request.      Tinnie Gens, RN  06/04/2020

## 2020-06-04 NOTE — Patient Instructions (Addendum)
Bone Density Test A bone density test uses a type of X-ray to measure the amount of calcium and other minerals in a person's bones. It can measure bone density in the hip and the spine. The test is similar to having a regular X-ray. This test may also be called:  Bone densitometry.  Bone mineral density test.  Dual-energy X-ray absorptiometry (DEXA). You may have this test to:  Diagnose a condition that causes weak or thin bones (osteoporosis).  Screen you for osteoporosis.  Predict your risk for a broken bone (fracture).  Determine how well your osteoporosis treatment is working. Tell a health care provider about:  Any allergies you have.  All medicines you are taking, including vitamins, herbs, eye drops, creams, and over-the-counter medicines.  Any problems you or family members have had with anesthetic medicines.  Any blood disorders you have.  Any surgeries you have had.  Any medical conditions you have.  Whether you are pregnant or may be pregnant.  Any medical tests you have had within the past 14 days that used contrast material. What are the risks? Generally, this is a safe test. However, it does expose you to a small amount of radiation, which can slightly increase your cancer risk. What happens before the test?  Do not take any calcium supplements within the 24 hours before your test.  You will need to remove all metal jewelry, eyeglasses, removable dental appliances, and any other metal objects on your body. What happens during the test?  You will lie down on an exam table. There will be an X-ray generator below you and an imaging device above you.  Other devices, such as boxes or braces, may be used to position your body properly for the scan.  The machine will slowly scan your body. You will need to keep very still while the machine does the scan.  The images will show up on a screen in the room. Images will be examined by a specialist after your  test is finished. The procedure may vary among health care providers and hospitals.   What can I expect after the test? It is up to you to get the results of your test. Ask your health care provider, or the department that is doing the test, when your results will be ready. Summary  A bone density test is an imaging test that uses a type of X-ray to measure the amount of calcium and other minerals in your bones.  The test may be used to diagnose or screen you for a condition that causes weak or thin bones (osteoporosis), predict your risk for a broken bone (fracture), or determine how well your osteoporosis treatment is working.  Do not take any calcium supplements within 24 hours before your test.  Ask your health care provider, or the department that is doing the test, when your results will be ready. This information is not intended to replace advice given to you by your health care provider. Make sure you discuss any questions you have with your health care provider. Document Revised: 10/11/2019 Document Reviewed: 10/11/2019 Elsevier Patient Education  2021 [place].   Diabetes Mellitus and [place] care is an important part of your health, especially when you have diabetes. Diabetes may cause you to have problems because of poor blood flow (circulation) to your feet and legs, which can cause your skin to:  Become thinner and drier.  Break more easily.  Heal more slowly.  Peel and crack.  You may also have nerve damage (neuropathy) in your legs and feet, causing decreased feeling in them. This means that you may not notice minor injuries to your feet that could lead to more serious problems. Noticing and addressing any potential problems early is the best way to prevent future foot problems. How to care for your feet Foot hygiene  Wash your feet daily with warm water and mild soap. Do not use hot water. Then, pat your feet and the areas between your toes until they are  completely dry. Do not soak your feet as this can dry your skin.  Trim your toenails straight across. Do not dig under them or around the cuticle. File the edges of your nails with an emery board or nail file.  Apply a moisturizing lotion or petroleum jelly to the skin on your feet and to dry, brittle toenails. Use lotion that does not contain alcohol and is unscented. Do not apply lotion between your toes.   Shoes and socks  Wear clean socks or stockings every day. Make sure they are not too tight. Do not wear knee-high stockings since they may decrease blood flow to your legs.  Wear shoes that fit properly and have enough cushioning. Always look in your shoes before you put them on to be sure there are no objects inside.  To break in new shoes, wear them for just a few hours a day. This prevents injuries on your feet. Wounds, scrapes, corns, and calluses  Check your feet daily for blisters, cuts, bruises, sores, and redness. If you cannot see the bottom of your feet, use a mirror or ask someone for help.  Do not cut corns or calluses or try to remove them with medicine.  If you find a minor scrape, cut, or break in the skin on your feet, keep it and the skin around it clean and dry. You may clean these areas with mild soap and water. Do not clean the area with peroxide, alcohol, or iodine.  If you have a wound, scrape, corn, or callus on your foot, look at it several times a day to make sure it is healing and not infected. Check for: ? Redness, swelling, or pain. ? Fluid or blood. ? Warmth. ? Pus or a bad smell.   General tips  Do not cross your legs. This may decrease blood flow to your feet.  Do not use heating pads or hot water bottles on your feet. They may burn your skin. If you have lost feeling in your feet or legs, you may not know this is happening until it is too late.  Protect your feet from hot and cold by wearing shoes, such as at the beach or on hot  pavement.  Schedule a complete foot exam at least once a year (annually) or more often if you have foot problems. Report any cuts, sores, or bruises to your health care provider immediately. Where to find more information  American Diabetes Association: www.diabetes.org  Association of Diabetes Care & Education Specialists: www.diabeteseducator.org Contact a health care provider if:  You have a medical condition that increases your risk of infection and you have any cuts, sores, or bruises on your feet.  You have an injury that is not healing.  You have redness on your legs or feet.  You feel burning or tingling in your legs or feet.  You have pain or cramps in your legs and feet.  Your legs or feet are numb.  Your  feet always feel cold.  You have pain around any toenails. Get help right away if:  You have a wound, scrape, corn, or callus on your foot and: ? You have pain, swelling, or redness that gets worse. ? You have fluid or blood coming from the wound, scrape, corn, or callus. ? Your wound, scrape, corn, or callus feels warm to the touch. ? You have pus or a bad smell coming from the wound, scrape, corn, or callus. ? You have a fever. ? You have a red line going up your leg. Summary  Check your feet every day for blisters, cuts, bruises, sores, and redness.  Apply a moisturizing lotion or petroleum jelly to the skin on your feet and to dry, brittle toenails.  Wear shoes that fit properly and have enough cushioning.  If you have foot problems, report any cuts, sores, or bruises to your health care provider immediately.  Schedule a complete foot exam at least once a year (annually) or more often if you have foot problems. This information is not intended to replace advice given to you by your health care provider. Make sure you discuss any questions you have with your health care provider. Document Revised: 11/15/2019 Document Reviewed: 11/15/2019 Elsevier Patient  Education  [place] Maintenance, Female Adopting a healthy lifestyle and getting preventive care are important in promoting health and wellness. Ask your health care provider about:  The right schedule for you to have regular tests and exams.  Things you can do on your own to prevent diseases and keep yourself healthy. What should I know about diet, weight, and exercise? Eat a healthy diet  Eat a diet that includes plenty of vegetables, fruits, low-fat dairy products, and lean protein.  Do not eat a lot of foods that are high in solid fats, added sugars, or sodium.   Maintain a healthy weight Body mass index (BMI) is used to identify weight problems. It estimates body fat based on height and weight. Your health care provider can help determine your BMI and help you achieve or maintain a healthy weight. Get regular exercise Get regular exercise. This is one of the most important things you can do for your health. Most adults should:  Exercise for at least 150 minutes each week. The exercise should increase your heart rate and make you sweat (moderate-intensity exercise).  Do strengthening exercises at least twice a week. This is in addition to the moderate-intensity exercise.  Spend less time sitting. Even light physical activity can be beneficial. Watch cholesterol and blood lipids Have your blood tested for lipids and cholesterol at 73 years of age, then have this test every 5 years. Have your cholesterol levels checked more often if:  Your lipid or cholesterol levels are high.  You are older than 73 years of age.  You are at high risk for heart disease. What should I know about cancer screening? Depending on your health history and family history, you may need to have cancer screening at various ages. This may include screening for:  Breast cancer.  Cervical cancer.  Colorectal cancer.  Skin cancer.  Lung cancer. What should I know about heart  disease, diabetes, and high blood pressure? Blood pressure and heart disease  High blood pressure causes heart disease and increases the risk of stroke. This is more likely to develop in people who have high blood pressure readings, are of African descent, or are overweight.  Have your blood pressure checked: ?  Every 3-5 years if you are 47-79 years of age. ? Every year if you are 26 years old or older. Diabetes Have regular diabetes screenings. This checks your fasting blood sugar level. Have the screening done:  Once every three years after age 47 if you are at a normal weight and have a low risk for diabetes.  More often and at a younger age if you are overweight or have a high risk for diabetes. What should I know about preventing infection? Hepatitis B If you have a higher risk for hepatitis B, you should be screened for this virus. Talk with your health care provider to find out if you are at risk for hepatitis B infection. Hepatitis C Testing is recommended for:  Everyone born from [birth year] through 1963.  Anyone with known risk factors for hepatitis C. Sexually transmitted infections (STIs)  Get screened for STIs, including gonorrhea and chlamydia, if: ? You are sexually active and are younger than 73 years of age. ? You are older than 73 years of age and your health care provider tells you that you are at risk for this type of infection. ? Your sexual activity has changed since you were last screened, and you are at increased risk for chlamydia or gonorrhea. Ask your health care provider if you are at risk.  Ask your health care provider about whether you are at high risk for HIV. Your health care provider may recommend a prescription medicine to help prevent HIV infection. If you choose to take medicine to prevent HIV, you should first get tested for HIV. You should then be tested every 3 months for as long as you are taking the medicine. Pregnancy  If you are about to stop  having your period (premenopausal) and you may become pregnant, seek counseling before you get pregnant.  Take 400 to 800 micrograms (mcg) of folic acid every day if you become pregnant.  Ask for birth control (contraception) if you want to prevent pregnancy. Osteoporosis and menopause Osteoporosis is a disease in which the bones lose minerals and strength with aging. This can result in bone fractures. If you are 41 years old or older, or if you are at risk for osteoporosis and fractures, ask your health care provider if you should:  Be screened for bone loss.  Take a calcium or vitamin D supplement to lower your risk of fractures.  Be given hormone replacement therapy (HRT) to treat symptoms of menopause. Follow these instructions at home: Lifestyle  Do not use any products that contain nicotine or tobacco, such as cigarettes, e-cigarettes, and chewing tobacco. If you need help quitting, ask your health care provider.  Do not use street drugs.  Do not share needles.  Ask your health care provider for help if you need support or information about quitting drugs. Alcohol use  Do not drink alcohol if: ? Your health care provider tells you not to drink. ? You are pregnant, may be pregnant, or are planning to become pregnant.  If you drink alcohol: ? Limit how much you use to 0-1 drink a day. ? Limit intake if you are breastfeeding.  Be aware of how much alcohol is in your drink. In the [place] one drink equals one 12 oz bottle of beer (355 mL), one 5 oz glass of wine (148 mL), or one 1 oz glass of hard liquor (44 mL). General instructions  Schedule regular health, dental, and eye exams.  Stay current with your vaccines.  Tell your  health care provider if: ? You often feel depressed. ? You have ever been abused or do not feel safe at home. Summary  Adopting a healthy lifestyle and getting preventive care are important in promoting health and wellness.  Follow your health  care provider's instructions about healthy diet, exercising, and getting tested or screened for diseases.  Follow your health care provider's instructions on monitoring your cholesterol and blood pressure. This information is not intended to replace advice given to you by your health care provider. Make sure you discuss any questions you have with your health care provider. Document Revised: 04/19/2018 Document Reviewed: 04/19/2018 Elsevier Patient Education  2021 [place].   Hearing Loss Hearing loss is a partial or total loss of the ability to hear. This can be temporary or permanent, and it can happen in one or both ears. Medical care is necessary to treat hearing loss properly and to prevent the condition from getting worse. Your hearing may partially or completely come back, depending on what caused your hearing loss and how severe it is. In some cases, hearing loss is permanent. What are the causes? Common causes of hearing loss include:  Too much wax in the ear canal.  Infection of the ear canal or middle ear.  Fluid in the middle ear.  Injury to the ear or surrounding area.  An object stuck in the ear.  A history of prolonged exposure to loud sounds, such as music. Less common causes of hearing loss include:  Tumors in the ear.  Viral or bacterial infections, such as meningitis.  A hole in the eardrum (perforated eardrum).  Problems with the hearing nerve that sends signals between the brain and the ear.  Certain medicines. What are the signs or symptoms? Symptoms of this condition may include:  Difficulty telling the difference between sounds.  Difficulty following a conversation when there is background noise.  Lack of response to sounds in your environment. This may be most noticeable when you do not respond to startling sounds.  Needing to turn up the volume on the television, radio, or other devices.  Ringing in the ears.  Dizziness. How is this  diagnosed? This condition is diagnosed based on:  A physical exam.  A hearing test (audiometry). The audiometry test will be performed by a hearing specialist (audiologist). You may also be referred to an ear, nose, and throat (ENT) specialist (otolaryngologist).   How is this treated? Treatment for hearing loss may include:  Ear wax removal.  Medicines to treat or prevent infection (antibiotics).  Medicines to reduce inflammation (corticosteroids).  Hearing aids for hearing loss related to nerve damage. Follow these instructions at home:  If you were prescribed an antibiotic medicine, take it as told by your health care provider. Do not stop taking the antibiotic even if you start to feel better.  Take over-the-counter and prescription medicines only as told by your health care provider.  Avoid loud noises.  Return to your normal activities as told by your health care provider. Ask your health care provider what activities are safe for you.  Keep all follow-up visits as told by your health care provider. This is important. Contact a health care provider if:  You feel dizzy.  You develop new symptoms.  You vomit or feel nauseous.  You have a fever. Get help right away if:  You develop sudden changes in your vision.  You have severe ear pain.  You have new or increased weakness.  You have  a severe headache. Summary  Hearing loss is a decreased ability to hear sounds around you. It can be temporary or permanent.  Treatment will depend on the cause of your hearing loss. It may include ear wax removal, medicines, or a hearing aid.  Your hearing may partially or completely come back, depending on what caused your hearing loss and how severe it is.  Keep all follow-up visits as told by your health care provider. This is important. This information is not intended to replace advice given to you by your health care provider. Make sure you discuss any questions you have  with your health care provider. Document Revised: 01/24/2018 Document Reviewed: 01/24/2018 Elsevier Patient Education  2021 [place] A mammogram is a low energy X-ray of the breasts that is done to check for abnormal changes. This procedure can screen for and detect any changes that may indicate breast cancer. Mammograms are regularly done on women. A man may have a mammogram if he has a lump or swelling in his breast. A mammogram can also identify other changes and variations in the breast, such as:  Inflammation of the breast tissue (mastitis).  An infected area that contains a collection of pus (abscess).  A fluid-filled sac (cyst).  Fibrocystic changes. This is when breast tissue becomes denser, which can make the tissue feel rope-like or uneven under the skin.  Tumors that are not cancerous (benign). Tell a health care provider:  About any allergies you have.  If you have breast implants.  If you have had previous breast disease, biopsy, or surgery.  If you are breastfeeding.  If you are younger than age 61.  If you have a family history of breast cancer.  Whether you are pregnant or may be pregnant. What are the risks? Generally, this is a safe procedure. However, problems may occur, including:  Exposure to radiation. Radiation levels are very low with this test.  The results being misinterpreted.  The need for further tests.  The inability of the mammogram to detect certain cancers. What happens before the procedure?  Schedule your test about 1-2 weeks after your menstrual period if you are still menstruating. This is usually when your breasts are the least tender.  If you have had a mammogram done at a different facility in the past, get the mammogram X-rays or have them sent to your current exam facility. The new and old images will be compared.  Wash your breasts and underarms on the day of the test.  Do not wear deodorants, perfumes,  lotions, or powders anywhere on your body on the day of the test.  Remove any jewelry from your neck.  Wear clothes that you can change into and out of easily. What happens during the procedure?  You will undress from the waist up and put on a gown that opens in the front.  You will stand in front of the X-ray machine.  Each breast will be placed between two plastic or glass plates. The plates will compress your breast for a few seconds. Try to stay as relaxed as possible during the procedure. This does not cause any harm to your breasts and any discomfort you feel will be very brief.  X-rays will be taken from different angles of each breast. The procedure may vary among health care providers and hospitals.   What happens after the procedure?  The mammogram will be examined by a specialist (radiologist).  You may need to repeat certain  parts of the test, depending on the quality of the images. This is commonly done if the radiologist needs a better view of the breast tissue.  You may resume your normal activities.  It is up to you to get the results of your procedure. Ask your health care provider, or the department that is doing the procedure, when your results will be ready. Summary  A mammogram is a low energy X-ray of the breasts that is done to check for abnormal changes. A man may have a mammogram if he has a lump or swelling in his breast.  If you have had a mammogram done at a different facility in the past, get the mammogram X-rays or have them sent to your current exam facility in order to compare them.  Schedule your test about 1-2 weeks after your menstrual period if you are still menstruating.  For this test, each breast will be placed between two plastic or glass plates. The plates will compress your breast for a few seconds.  Ask when your test results will be ready. Make sure you get your test results. This information is not intended to replace advice given to you  by your health care provider. Make sure you discuss any questions you have with your health care provider. Document Revised: 12/15/2017 Document Reviewed: 12/15/2017 Elsevier Patient Education  2021 [place] Maintenance Summary and Written Plan of Care  Ms. Joslin ,  Thank you for allowing me to perform your Medicare Annual Wellness Visit and for your ongoing commitment to your health.   Health Maintenance & Immunization History Health Maintenance  Topic Date Due  . COVID-19 Vaccine (2 - Moderna risk 4-dose series) 06/20/2020 (Originally 10/22/2019)  . URINE MICROALBUMIN  07/05/2020 (Originally 05/31/2020)  . INFLUENZA VACCINE  08/07/2020 (Originally 12/09/2019)  . MAMMOGRAM  08/07/2020 (Originally 05/01/2020)  . PNA vac Low Risk Adult (1 of 2 - PCV13) 10/29/2020 (Originally 12/27/2012)  . FOOT EXAM  06/04/2021 (Originally 12/15/2019)  . HEMOGLOBIN A1C  10/17/2020  . OPHTHALMOLOGY EXAM  03/19/2021  . COLONOSCOPY (Pts 45-25yrs Insurance coverage will need to be confirmed)  05/06/2023  . TETANUS/TDAP  04/04/2028  . DEXA SCAN  Completed  . Hepatitis C Screening  Completed   Immunization History  Administered Date(s) Administered  . Moderna Sars-Covid-2 Vaccination 09/24/2019  . Tdap 04/04/2018    These are the patient goals that we discussed: Goals Addressed              This Visit's Progress   .  Patient Stated (pt-stated)        06/04/2020 AWV Goal: Exercise for General Health   Patient will verbalize understanding of the benefits of increased physical activity:  Exercising regularly is important. It will improve your overall fitness, flexibility, and endurance.  Regular exercise also will improve your overall health. It can help you control your weight, reduce stress, and improve your bone density.  Over the next year, patient will increase physical activity as tolerated with a goal of at least 150 minutes of moderate  physical activity per week.   You can tell that you are exercising at a moderate intensity if your heart starts beating faster and you start breathing faster but can still hold a conversation.  Moderate-intensity exercise ideas include:  Walking 1 mile (1.6 km) in about 15 minutes  Biking  Hiking  Golfing  Dancing  Water aerobics  Patient will verbalize understanding of everyday activities that  increase physical activity by providing examples like the following: ? Yard work, such as: ? Pushing a Conservation officer, nature ? Raking and bagging leaves ? Washing your car ? Pushing a stroller ? Shoveling snow ? Gardening ? Washing windows or floors  Patient will be able to explain general safety guidelines for exercising:   Before you start a new exercise program, talk with your health care provider.  Do not exercise so much that you hurt yourself, feel dizzy, or get very short of breath.  Wear comfortable clothes and wear shoes with good support.  Drink plenty of water while you exercise to prevent dehydration or heat stroke.  Work out until your breathing and your heartbeat get faster.   06/04/2020  AWV Goal: Diabetes Management   . Patient will maintain an A1C level below 8.0 . Patient will not develop any diabetic foot complications . Patient will not experience any hypoglycemic episodes over the next 3 months . Patient will notify our office of any CBG readings outside of the provider recommended range by calling [phone] . Patient will adhere to provider recommendations for diabetes management    Patient Self Management Activities . take all medications as prescribed and report any negative side effects . monitor and record blood sugar readings as directed . adhere to a low carbohydrate diet that incorporates lean proteins, vegetables, whole grains, low glycemic fruits . check feet daily noting any sores, cracks, injuries, or callous formations . see PCP or podiatrist  if she notices any changes in her legs, feet, or toenails . Patient will visit PCP and have an A1C level checked every 3 to 6 months as directed  . have a yearly eye exam to monitor for vascular changes associated with diabetes and will request that the report be sent to her pcp.  . consult with her PCP regarding any changes in her health or new or worsening symptoms         This is a list of Health Maintenance Items that are overdue or due now: Pneumococcal vaccine  Influenza vaccine Screening mammography Bone densitometry screening  Shingles vaccine  Orders/Referrals Placed Today: . Follow-up with Donella Stade, PA-C as planned . Schedule your appointment for shingles vaccine, pneumonia vaccine and covid vaccine. (Contact our referral department at [phone] if you have not spoken with someone about your referral appointment within the next 5 days)    Follow-up Plan . Follow-up with Donella Stade, PA-C as planned . Schedule your appointment for shingles vaccine, pneumonia vaccine and covid vaccine.

## 2020-06-06 ENCOUNTER — Ambulatory Visit: Payer: Medicare HMO | Admitting: Physician Assistant

## 2020-06-10 ENCOUNTER — Ambulatory Visit (INDEPENDENT_AMBULATORY_CARE_PROVIDER_SITE_OTHER): Payer: Medicare HMO | Admitting: Physician Assistant

## 2020-06-10 DIAGNOSIS — Z5329 Procedure and treatment not carried out because of patient's decision for other reasons: Secondary | ICD-10-CM

## 2020-06-10 NOTE — Progress Notes (Signed)
No show

## 2020-06-26 DIAGNOSIS — R5383 Other fatigue: Secondary | ICD-10-CM | POA: Diagnosis not present

## 2020-06-26 DIAGNOSIS — Z981 Arthrodesis status: Secondary | ICD-10-CM | POA: Diagnosis not present

## 2020-06-26 DIAGNOSIS — R519 Headache, unspecified: Secondary | ICD-10-CM | POA: Diagnosis not present

## 2020-07-18 ENCOUNTER — Ambulatory Visit: Payer: Medicare HMO | Admitting: Physician Assistant

## 2020-07-18 DIAGNOSIS — E1165 Type 2 diabetes mellitus with hyperglycemia: Secondary | ICD-10-CM

## 2020-07-18 DIAGNOSIS — E785 Hyperlipidemia, unspecified: Secondary | ICD-10-CM

## 2020-07-21 ENCOUNTER — Telehealth: Payer: Self-pay | Admitting: Physician Assistant

## 2020-07-21 ENCOUNTER — Ambulatory Visit: Payer: Medicare HMO | Admitting: Physician Assistant

## 2020-07-21 NOTE — Telephone Encounter (Signed)
Pt called at 10:17 this morning to cancel her appointment.  Thank you.

## 2020-07-22 ENCOUNTER — Ambulatory Visit (INDEPENDENT_AMBULATORY_CARE_PROVIDER_SITE_OTHER): Payer: Medicare HMO | Admitting: Physician Assistant

## 2020-07-22 ENCOUNTER — Other Ambulatory Visit: Payer: Self-pay

## 2020-07-22 ENCOUNTER — Encounter: Payer: Self-pay | Admitting: Physician Assistant

## 2020-07-22 VITALS — BP 122/88 | HR 87 | Ht 59.0 in | Wt 215.0 lb

## 2020-07-22 DIAGNOSIS — E785 Hyperlipidemia, unspecified: Secondary | ICD-10-CM | POA: Diagnosis not present

## 2020-07-22 DIAGNOSIS — R300 Dysuria: Secondary | ICD-10-CM | POA: Diagnosis not present

## 2020-07-22 DIAGNOSIS — H9313 Tinnitus, bilateral: Secondary | ICD-10-CM

## 2020-07-22 DIAGNOSIS — E1165 Type 2 diabetes mellitus with hyperglycemia: Secondary | ICD-10-CM

## 2020-07-22 DIAGNOSIS — R8290 Unspecified abnormal findings in urine: Secondary | ICD-10-CM | POA: Diagnosis not present

## 2020-07-22 DIAGNOSIS — G4700 Insomnia, unspecified: Secondary | ICD-10-CM | POA: Diagnosis not present

## 2020-07-22 DIAGNOSIS — R059 Cough, unspecified: Secondary | ICD-10-CM | POA: Diagnosis not present

## 2020-07-22 DIAGNOSIS — M1711 Unilateral primary osteoarthritis, right knee: Secondary | ICD-10-CM | POA: Diagnosis not present

## 2020-07-22 LAB — POCT URINALYSIS DIP (CLINITEK)
Bilirubin, UA: NEGATIVE
Blood, UA: NEGATIVE
Glucose, UA: NEGATIVE mg/dL
Ketones, POC UA: NEGATIVE mg/dL
Nitrite, UA: NEGATIVE
POC PROTEIN,UA: NEGATIVE
Spec Grav, UA: >=1.03 — AB (ref 1.010–1.025)
Urobilinogen, UA: 0.2 E.U./dL
pH, UA: 5.5 (ref 5.0–8.0)

## 2020-07-22 LAB — POCT GLYCOSYLATED HEMOGLOBIN (HGB A1C): Hemoglobin A1C: 8.2 % — AB (ref 4.0–5.6)

## 2020-07-22 LAB — POCT UA - MICROALBUMIN
Creatinine, POC: 300 mg/dL
Microalbumin Ur, POC: 80 mg/L

## 2020-07-22 MED ORDER — ESOMEPRAZOLE MAGNESIUM 40 MG PO CPDR: 40.0000 mg | DELAYED_RELEASE_CAPSULE | Freq: Every day | ORAL | 3 refills | Status: DC

## 2020-07-22 MED ORDER — FLUCONAZOLE 150 MG PO TABS: ORAL_TABLET | ORAL | 0 refills | Status: DC

## 2020-07-22 MED ORDER — NITROFURANTOIN MONOHYD MACRO 100 MG PO CAPS
100.0000 mg | ORAL_CAPSULE | Freq: Two times a day (BID) | ORAL | 0 refills | Status: DC
Start: 2020-07-22 — End: 2020-10-22

## 2020-07-22 MED ORDER — OZEMPIC (0.25 OR 0.5 MG/DOSE) 2 MG/1.5ML ~~LOC~~ SOPN: 0.5000 mg | PEN_INJECTOR | SUBCUTANEOUS | 2 refills | Status: DC

## 2020-07-22 MED ORDER — TRAZODONE HCL 50 MG PO TABS: 25.0000 mg | ORAL_TABLET | Freq: Every evening | ORAL | 2 refills | Status: DC | PRN

## 2020-07-22 NOTE — Progress Notes (Incomplete)
   Subjective:    Patient ID: Nicole Giles, female    DOB: July 03, 1945, 75 y.o.   MRN: [id number]  HPI  Burning and odor-  Tired  Not sleeping  Cervical spinal steonosis  Truliity. Cost cyclobenazpine  flonase for ear  Neck can't aly black sit recliner     Review of Systems     Objective:   Physical Exam    .. Lab Results  Component Value Date   HGBA1C 8.2 (A) 07/22/2020       Assessment & Plan:

## 2020-07-23 LAB — COMPLETE METABOLIC PANEL WITHOUT GFR
AG Ratio: 1.6 (calc) (ref 1.0–2.5)
ALT: 33 U/L — ABNORMAL HIGH (ref 6–29)
AST: 50 U/L — ABNORMAL HIGH (ref 10–35)
Albumin: 4.2 g/dL (ref 3.6–5.1)
Alkaline phosphatase (APISO): 169 U/L — ABNORMAL HIGH (ref 37–153)
BUN: 11 mg/dL (ref 7–25)
CO2: 23 mmol/L (ref 20–32)
Calcium: 10.1 mg/dL (ref 8.6–10.4)
Chloride: 102 mmol/L (ref 98–110)
Creat: 0.79 mg/dL (ref 0.60–0.93)
GFR, Est African American: 87 mL/min/1.73m2
GFR, Est Non African American: 75 mL/min/1.73m2
Globulin: 2.6 g/dL (ref 1.9–3.7)
Glucose, Bld: 157 mg/dL — ABNORMAL HIGH (ref 65–139)
Potassium: 4.4 mmol/L (ref 3.5–5.3)
Sodium: 138 mmol/L (ref 135–146)
Total Bilirubin: 0.8 mg/dL (ref 0.2–1.2)
Total Protein: 6.8 g/dL (ref 6.1–8.1)

## 2020-07-23 LAB — LIPID PANEL W/REFLEX DIRECT LDL
Cholesterol: 236 mg/dL — ABNORMAL HIGH
HDL: 38 mg/dL — ABNORMAL LOW
LDL Cholesterol (Calc): 161 mg/dL — ABNORMAL HIGH
Non-HDL Cholesterol (Calc): 198 mg/dL — ABNORMAL HIGH
Total CHOL/HDL Ratio: 6.2 (calc) — ABNORMAL HIGH
Triglycerides: 201 mg/dL — ABNORMAL HIGH

## 2020-07-24 ENCOUNTER — Telehealth: Payer: Self-pay | Admitting: Neurology

## 2020-07-24 NOTE — Telephone Encounter (Signed)
Patient called stating after starting medication Tuesday she has had vomiting since Wednesday AM. She started Ozempic, Macrobid, and took a Diflucan.  She is having vomiting, headache, and dizziness. She is able to drink, but can not keep food down. I did advise if she gets worse to seek care in the ED/Urgent Care.  Nicole Giles - She is wondering if related to medications or if she should be Covid tested? No other symptoms. Please advise.

## 2020-07-25 ENCOUNTER — Encounter: Payer: Self-pay | Admitting: Physician Assistant

## 2020-07-25 NOTE — Telephone Encounter (Signed)
Patient called back to let us know she took a home test and it was negative. LMOM.

## 2020-07-25 NOTE — Telephone Encounter (Signed)
It could be the ozempic. Yes get covid tested as well though.

## 2020-07-25 NOTE — Telephone Encounter (Signed)
Patient made aware. She will get tested and let us know. Still having nausea and diarrhea today.

## 2020-07-25 NOTE — Progress Notes (Signed)
Subjective:    Patient ID: Nicole Giles, female    DOB: [birth year], 75 y.o.   MRN: [id number]  HPI  Patient is a 75 year old obese female with type 2 diabetes, OSA, polymyalgia, cervical spinal stenosis, hereditary hemochromatosis who presents to the clinic for her 44-month follow-up.  Patient is having some urinary burning and odor.  She does feel more worn down than she usually does.  She wonders if she could have a UTI.  She denies any fever, abdominal pain, flank pain.  She has not tried anything to make better.  She continues to be in a neck brace from her cervical spinal stenosis fusion surgery.  This prevents her from lying flat at night and she has to sleep in a recliner.  She is not getting good rest.  She is having trouble going to sleep and staying asleep.  She is wondering if there is anything she can take to help with this.  Patient is not checking her sugars.  Patient admits she has not been taking her Trulicity because of the cost.  She tries to watch her diet but is not always successful.  She has been pretty ambulatory because of her pain.  She denies any hypoglycemic events.  She denies any worsening vision changes, thirst, open sores or wounds.  Patient is having some bad bilateral tinnitus.  Denies any dizziness.  Denies any hearing loss.  .. Active Ambulatory Problems    Diagnosis Date Noted  . HYPERLIPIDEMIA 08/28/2007  . Morbid obesity ([place]) 11/17/2006  . HEADACHE, TENSION 10/12/2006  . OSA on CPAP 09/21/2007  . VERTIGO, BENIGN PAROXYSMAL POSITION 02/15/2007  . GERD 08/22/2008  . FATTY LIVER DISEASE 09/04/2008  . UNSPECIFIED DISORDER OF KIDNEY AND URETER 09/02/2008  . ARTHRITIS, KNEES, BILATERAL 06/01/2007  . CERVICALGIA 06/13/2008  . Pain in limb 11/13/2007  . DIZZINESS 08/22/2008  . FATIGUE 12/08/2006  . DEPENDENT EDEMA, LEGS 08/25/2007  . Dyspnea 08/22/2008  . CHEST PAIN 06/30/2007  . FREQUENCY, URINARY 09/09/2008  . TRANSAMINASES, SERUM, ELEVATED  08/27/2008  . INJURY OTHER AND UNSPECIFIED FINGER 10/04/2008  . Hyperlipidemia LDL goal <70 12/17/2013  . Arthralgia 12/17/2013  . Body aches 12/17/2013  . Elevated fasting glucose 01/16/2014  . Primary osteoarthritis of right knee 08/15/2014  . Peripheral edema 12/31/2015  . Elevated blood pressure 12/31/2015  . Polymyalgia ([place]) 08/11/2016  . No energy 08/19/2016  . Myalgia 08/19/2016  . Cough 08/19/2016  . 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 Weakness 09/19/2016  . Elevated C-reactive protein (CRP) 09/19/2016  . Acute pain of left shoulder 09/19/2016  . Pain of left calf 01/16/2017  . 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 Diverticulosis 05/12/2018  . Internal hemorrhoids 05/12/2018  . Colon polyp 05/12/2018  . Controlled type 2 diabetes mellitus with hyperglycemia, without long-term current use of insulin ([place]) 12/15/2018  . Hereditary hemochromatosis ([place]) 04/02/2019  . Nausea 04/02/2019  . History of pulmonary embolus (PE) 07/13/2018  . Fatty liver 06/16/2018  . Baker's cyst of knee, right 11/22/2018  . History of TIA (transient ischemic attack) 04/21/2020  . Cervical stenosis of spinal canal 04/21/2020  . Tinnitus of both ears 07/22/2020   Resolved Ambulatory Problems    Diagnosis Date Noted  . 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 INFLUENZA DUE TO ID NOVEL H1N1 INFLUENZA VIRUS 03/20/2008  . UTI 11/29/2008  . Pre-diabetes 01/23/2014  . Pyelonephritis 08/19/2016   Past Medical History:  Diagnosis  Date  . GERD (gastroesophageal reflux disease)   . H/O blood clots   . OBSTRUCTIVE SLEEP APNEA 09/21/2007     Review of Systems  Constitutional: Positive for fatigue.  HENT: Negative.   Musculoskeletal: Positive for arthralgias, myalgias, neck pain and neck stiffness.       Objective:   Physical Exam Vitals reviewed.  Constitutional:      Appearance: Normal appearance. She is  obese.  HENT:     Head: Normocephalic.  Neck:     Comments: In neck brace Cardiovascular:     Rate and Rhythm: Normal rate and regular rhythm.  Pulmonary:     Effort: Pulmonary effort is normal.     Breath sounds: Normal breath sounds.  Abdominal:     General: There is no distension.     Palpations: Abdomen is soft. There is no mass.     Tenderness: There is no abdominal tenderness. There is no right CVA tenderness, left CVA tenderness, guarding or rebound.  Musculoskeletal:     Right lower leg: No edema.     Left lower leg: No edema.  Neurological:     General: No focal deficit present.     Mental Status: She is alert and oriented to person, place, and time.  Psychiatric:        Mood and Affect: Mood normal.     .. Results for orders placed or performed in visit on 07/22/20  POCT glycosylated hemoglobin (Hb A1C)  Result Value Ref Range   Hemoglobin A1C 8.2 (A) 4.0 - 5.6 %   HbA1c POC (<> result, manual entry)     HbA1c, POC (prediabetic range)     HbA1c, POC (controlled diabetic range)    POCT UA - Microalbumin  Result Value Ref Range   Microalbumin Ur, POC 80 mg/L   Creatinine, POC 300 mg/dL   Albumin/Creatinine Ratio, Urine, POC 30-300   POCT URINALYSIS DIP (CLINITEK)  Result Value Ref Range   Color, UA yellow yellow   Clarity, UA clear clear   Glucose, UA negative negative mg/dL   Bilirubin, UA negative negative   Ketones, POC UA negative negative mg/dL   Spec Grav, UA >=1.030 (A) 1.010 - 1.025   Blood, UA negative negative   pH, UA 5.5 5.0 - 8.0   POC PROTEIN,UA negative negative, trace   Urobilinogen, UA 0.2 0.2 or 1.0 E.U./dL   Nitrite, UA Negative Negative   Leukocytes, UA Small (1+) (A) Negative         Assessment & Plan:  Marland KitchenMarland KitchenKeniesha was seen today for diabetes.  Diagnoses and all orders for this visit:  Uncontrolled type 2 diabetes mellitus with hyperglycemia (Nicole Giles) -     POCT glycosylated hemoglobin (Hb A1C) -     POCT UA - Microalbumin -      Semaglutide,0.25 or 0.5MG /DOS, (OZEMPIC, 0.25 OR 0.5 MG/DOSE,) 2 MG/1.5ML SOPN; Inject 0.5 mg into the skin once a week.  Abnormal urine odor -     POCT URINALYSIS DIP (CLINITEK) -     nitrofurantoin, macrocrystal-monohydrate, (MACROBID) 100 MG capsule; Take 1 capsule (100 mg total) by mouth 2 (two) times daily.  Dysuria -     POCT URINALYSIS DIP (CLINITEK) -     nitrofurantoin, macrocrystal-monohydrate, (MACROBID) 100 MG capsule; Take 1 capsule (100 mg total) by mouth 2 (two) times daily.  Primary osteoarthritis of right knee  Cough -     esomeprazole (NEXIUM) 40 MG capsule; Take 1 capsule (40 mg total) by mouth daily at  12 noon.  Tinnitus of both ears  Insomnia, unspecified type -     traZODone (DESYREL) 50 MG tablet; Take 0.5-1 tablets (25-50 mg total) by mouth at bedtime as needed for sleep.  Other orders -     fluconazole (DIFLUCAN) 150 MG tablet; Take one tablet now and then one tablet after finish antibiotic and in 48-72 hours if symptoms persist.   UA does have some leukocytes in it and with patient symptoms we will treat empirically with Macrobid.  Will culture urine.  Sent Diflucan for history of yeast after antibiotic usage. Symptomatic care discussed.  Encouraged Flonase for tinnitus of ears.  Gave handout of potential causes.  Discussed sleep.  We will try trazodone.  Discussed side effects.  Follow-up as needed.  A1c is not controlled. She has not tolerated Metformin in the past. Sent over [place].  Discussed she needs to be on some type of medication. BP to goal.  Protein in urine.  Could be due to infection and/or uncontrolled sugars.  We will recheck in 3 months. On statin Up-to-date foot exam/eye exam Encouraged to get up-to-date on Covid vaccine.  Declined flu/pneumonia vaccine.  Patient aware of risks.

## 2020-07-28 NOTE — Progress Notes (Signed)
Nicole Giles,   Cholesterol did not improved and worsened a bit. You are not to goal over under 70.  We really need to increase your crestor. Did you pick up rx already?  Liver enzymes stable.  Kidney looks good.

## 2020-07-29 ENCOUNTER — Encounter: Payer: Self-pay | Admitting: Physician Assistant

## 2020-09-19 DIAGNOSIS — H25813 Combined forms of age-related cataract, bilateral: Secondary | ICD-10-CM | POA: Diagnosis not present

## 2020-10-22 ENCOUNTER — Ambulatory Visit (INDEPENDENT_AMBULATORY_CARE_PROVIDER_SITE_OTHER): Payer: Medicare HMO | Admitting: Physician Assistant

## 2020-10-22 ENCOUNTER — Other Ambulatory Visit: Payer: Self-pay

## 2020-10-22 VITALS — BP 104/69 | HR 98 | Ht 59.0 in | Wt 218.0 lb

## 2020-10-22 DIAGNOSIS — K760 Fatty (change of) liver, not elsewhere classified: Secondary | ICD-10-CM | POA: Diagnosis not present

## 2020-10-22 DIAGNOSIS — E039 Hypothyroidism, unspecified: Secondary | ICD-10-CM | POA: Diagnosis not present

## 2020-10-22 DIAGNOSIS — Z86711 Personal history of pulmonary embolism: Secondary | ICD-10-CM | POA: Diagnosis not present

## 2020-10-22 DIAGNOSIS — R252 Cramp and spasm: Secondary | ICD-10-CM | POA: Diagnosis not present

## 2020-10-22 DIAGNOSIS — D7589 Other specified diseases of blood and blood-forming organs: Secondary | ICD-10-CM | POA: Diagnosis not present

## 2020-10-22 DIAGNOSIS — G4733 Obstructive sleep apnea (adult) (pediatric): Secondary | ICD-10-CM | POA: Diagnosis not present

## 2020-10-22 DIAGNOSIS — R609 Edema, unspecified: Secondary | ICD-10-CM

## 2020-10-22 DIAGNOSIS — Z9989 Dependence on other enabling machines and devices: Secondary | ICD-10-CM | POA: Diagnosis not present

## 2020-10-22 DIAGNOSIS — E1165 Type 2 diabetes mellitus with hyperglycemia: Secondary | ICD-10-CM | POA: Diagnosis not present

## 2020-10-22 DIAGNOSIS — D6859 Other primary thrombophilia: Secondary | ICD-10-CM | POA: Diagnosis not present

## 2020-10-22 DIAGNOSIS — K2100 Gastro-esophageal reflux disease with esophagitis, without bleeding: Secondary | ICD-10-CM | POA: Diagnosis not present

## 2020-10-22 DIAGNOSIS — R0602 Shortness of breath: Secondary | ICD-10-CM | POA: Diagnosis not present

## 2020-10-22 DIAGNOSIS — R7989 Other specified abnormal findings of blood chemistry: Secondary | ICD-10-CM | POA: Diagnosis not present

## 2020-10-22 DIAGNOSIS — I2699 Other pulmonary embolism without acute cor pulmonale: Secondary | ICD-10-CM | POA: Diagnosis not present

## 2020-10-22 LAB — POCT GLYCOSYLATED HEMOGLOBIN (HGB A1C): Hemoglobin A1C: 10 % — AB (ref 4.0–5.6)

## 2020-10-22 MED ORDER — XIGDUO XR 10-1000 MG PO TB24: 1.0000 | ORAL_TABLET | Freq: Every day | ORAL | 2 refills | Status: DC

## 2020-10-22 MED ORDER — SUCRALFATE 1 G PO TABS
ORAL_TABLET | ORAL | 1 refills | Status: DC
Start: 2020-10-22 — End: 2020-12-22

## 2020-10-22 MED ORDER — TRESIBA FLEXTOUCH 100 UNIT/ML ~~LOC~~ SOPN: 10.0000 [IU] | PEN_INJECTOR | Freq: Every day | SUBCUTANEOUS | 0 refills | Status: DC

## 2020-10-22 NOTE — Patient Instructions (Addendum)
Start Tresiba 10 units daily, can increase by 2 units every 5 days until fasting sugars are between 90-130 And  Daily xigduo tablet   For sugars.  Start checking sugars every morning.  Follow up 1 months.    Start carafate before meals.

## 2020-10-22 NOTE — Progress Notes (Signed)
Subjective:    Patient ID: Nicole Giles, female    DOB: 1945/12/12, 75 y.o.   MRN: [id number]  HPI Pt is a 75 yo obese female with OSA, T2DM, chronic neck pain and s/p cervical fusion, HLD who presents ot the clinic for 3 month follow up.   She is not using CPAP right now because she is still sleeping in chair due to cerivcal fusion. O2 97 percent. Echo 05/2019 and EF good function. No concerning significant swelling in extremities.   Not checking sugars or taking medications like she should for diabetes. She is very tired. No hypoglycemia. No open sores or wounds. No CP, palpitations, headaches. She does have a productive cough after eating anything and feels like she is being chocked on sputum. She is taking nexium every day.    .. Active Ambulatory Problems    Diagnosis Date Noted   HYPERLIPIDEMIA 08/28/2007   Morbid obesity ([place]) 11/17/2006   HEADACHE, TENSION 10/12/2006   OSA on CPAP 09/21/2007   VERTIGO, BENIGN PAROXYSMAL POSITION 02/15/2007   GERD 08/22/2008   FATTY LIVER DISEASE 09/04/2008   UNSPECIFIED DISORDER OF KIDNEY AND URETER 09/02/2008   ARTHRITIS, KNEES, BILATERAL 06/01/2007   CERVICALGIA 06/13/2008   Pain in limb 11/13/2007   DIZZINESS 08/22/2008   FATIGUE 12/08/2006   DEPENDENT EDEMA, LEGS 08/25/2007   Dyspnea 08/22/2008   CHEST PAIN 06/30/2007   FREQUENCY, URINARY 09/09/2008   TRANSAMINASES, SERUM, ELEVATED 08/27/2008   INJURY OTHER AND UNSPECIFIED FINGER 10/04/2008   Hyperlipidemia LDL goal <70 12/17/2013   Arthralgia 12/17/2013   Body aches 12/17/2013   Elevated fasting glucose 01/16/2014   Primary osteoarthritis of right knee 08/15/2014   Peripheral edema 12/31/2015   Elevated blood pressure 12/31/2015   Polymyalgia ([place]) 08/11/2016   No energy 08/19/2016   Myalgia 08/19/2016   Cough 08/19/2016   Arthritis of shoulder region, left, degenerative 09/17/2016   Weakness 09/19/2016   Elevated C-reactive protein (CRP) 09/19/2016   Acute pain of left  shoulder 09/19/2016   Pain of left calf 01/16/2017   Post-traumatic osteoarthritis of right knee 11/30/2017   Fibroadenoma of right breast 05/12/2018   Diverticulosis 05/12/2018   Internal hemorrhoids 05/12/2018   Colon polyp 05/12/2018   Controlled type 2 diabetes mellitus with hyperglycemia, without long-term current use of insulin ([place]) 12/15/2018   Hereditary hemochromatosis ([place]) 04/02/2019   Nausea 04/02/2019   History of pulmonary embolus (PE) 07/13/2018   Fatty liver 06/16/2018   Baker's cyst of knee, right 11/22/2018   History of TIA (transient ischemic attack) 04/21/2020   Cervical stenosis of spinal canal 04/21/2020   Tinnitus of both ears 07/22/2020   Hypothyroidism 10/24/2020   Resolved Ambulatory Problems    Diagnosis Date Noted   PULMONARY EMBOLISM 10/12/2006   INFLUENZA DUE TO ID NOVEL H1N1 INFLUENZA VIRUS 03/20/2008   UTI 11/29/2008   Pre-diabetes 01/23/2014   Pyelonephritis 08/19/2016   Past Medical History:  Diagnosis Date   GERD (gastroesophageal reflux disease)    H/O blood clots    OBSTRUCTIVE SLEEP APNEA 09/21/2007     Review of Systems See HPI.     Objective:   Physical Exam Vitals reviewed.  Constitutional:      Appearance: Normal appearance. She is obese.  HENT:     Head: Normocephalic.  Neck:     Vascular: No carotid bruit.     Comments: Decreased ROM of neck and tenderness due to cervical fusion.  Cardiovascular:     Rate and Rhythm: Normal rate  and regular rhythm.     Pulses: Normal pulses.     Heart sounds: Murmur heard.  Pulmonary:     Effort: Pulmonary effort is normal.     Breath sounds: Normal breath sounds.  Abdominal:     General: Abdomen is flat.  Musculoskeletal:     Right lower leg: Edema present.     Left lower leg: Edema present.     Comments: Scant bilateral lower ext edema.   Lymphadenopathy:     Cervical: No cervical adenopathy.  Neurological:     Mental Status: She is alert and oriented to person, place, and  time. Mental status is at baseline.  Psychiatric:        Mood and Affect: Mood normal.       Lab Results  Component Value Date   HGBA1C 10.0 (A) 10/22/2020       Assessment & Plan:  Nicole Giles KitchenMarland KitchenNgan was seen today for diabetes.  Diagnoses and all orders for this visit:  Uncontrolled type 2 diabetes mellitus with hyperglycemia (H[facility]) -     POCT glycosylated hemoglobin (Hb A1C) -     Brain natriuretic peptide -     COMPLETE METABOLIC PANEL WITH GFR -     Magnesium -     Dapagliflozin-metFORMIN HCl ER (XIGDUO XR) 02-999 MG TB24; Take 1 tablet by mouth daily. -     TSH -     CBC with Differential/Platelet -     insulin degludec (TRESIBA FLEXTOUCH) 100 UNIT/ML FlexTouch Pen; Inject 10 Units into the skin at bedtime. Increase by 2 units every 5 days until fasting sugars are 90-130.  OSA on CPAP  Edema, unspecified type -     Brain natriuretic peptide -     COMPLETE METABOLIC PANEL WITH GFR -     Magnesium -     TSH -     CBC with Differential/Platelet  Cramping of feet -     Brain natriuretic peptide -     COMPLETE METABOLIC PANEL WITH GFR -     Magnesium -     TSH -     CBC with Differential/Platelet  SOB (shortness of breath) -     Brain natriuretic peptide -     COMPLETE METABOLIC PANEL WITH GFR -     Magnesium -     TSH -     CBC with Differential/Platelet  Hypothyroidism, unspecified type -     levothyroxine (SYNTHROID) 25 MCG tablet; Take 1 tablet (25 mcg total) by mouth daily.  Gastroesophageal reflux disease with esophagitis without hemorrhage -     sucralfate (CARAFATE) 1 g tablet; Take one tablet before meals up to 3 times a day.  A1C is 10.  Discussed with patient we MUST get sugars down.  Start log with checking sugars.  Start tresbia 10 units daily with increase by 2 units until fasting are between 90-130 in am.  Make sure taking xigduo.  Hold ozempic because of the GI concerns.  On statin.  BP to goal.  Covid/pneumonia to goal.  Follow up in 1 month  with BP logs.   Get labs. Make sure wearing CPAP.   For cough and choking start carafate. If not improving will consider more of work up. Wondering if could be some wegovy side effect and due to worsening GERD.

## 2020-10-23 ENCOUNTER — Other Ambulatory Visit: Payer: Self-pay | Admitting: Neurology

## 2020-10-23 DIAGNOSIS — E039 Hypothyroidism, unspecified: Secondary | ICD-10-CM

## 2020-10-23 LAB — CBC WITH DIFFERENTIAL/PLATELET
Absolute Monocytes: 658 cells/uL (ref 200–950)
Basophils Absolute: 66 cells/uL (ref 0–200)
Basophils Relative: 0.7 %
Eosinophils Absolute: 216 cells/uL (ref 15–500)
Eosinophils Relative: 2.3 %
HCT: 45.5 % — ABNORMAL HIGH (ref 35.0–45.0)
Hemoglobin: 15.1 g/dL (ref 11.7–15.5)
Lymphs Abs: 3243 cells/uL (ref 850–3900)
MCH: 31 pg (ref 27.0–33.0)
MCHC: 33.2 g/dL (ref 32.0–36.0)
MCV: 93.4 fL (ref 80.0–100.0)
MPV: 11.1 fL (ref 7.5–12.5)
Monocytes Relative: 7 %
Neutro Abs: 5217 cells/uL (ref 1500–7800)
Neutrophils Relative %: 55.5 %
Platelets: 260 10*3/uL (ref 140–400)
RBC: 4.87 10*6/uL (ref 3.80–5.10)
RDW: 13.1 % (ref 11.0–15.0)
Total Lymphocyte: 34.5 %
WBC: 9.4 10*3/uL (ref 3.8–10.8)

## 2020-10-23 LAB — MAGNESIUM: Magnesium: 1.7 mg/dL (ref 1.5–2.5)

## 2020-10-23 LAB — COMPLETE METABOLIC PANEL WITH GFR
AG Ratio: 1.3 (calc) (ref 1.0–2.5)
ALT: 37 U/L — ABNORMAL HIGH (ref 6–29)
AST: 74 U/L — ABNORMAL HIGH (ref 10–35)
Albumin: 4.1 g/dL (ref 3.6–5.1)
Alkaline phosphatase (APISO): 183 U/L — ABNORMAL HIGH (ref 37–153)
BUN: 11 mg/dL (ref 7–25)
CO2: 21 mmol/L (ref 20–32)
Calcium: 9.7 mg/dL (ref 8.6–10.4)
Chloride: 97 mmol/L — ABNORMAL LOW (ref 98–110)
Creat: 0.77 mg/dL (ref 0.60–0.93)
GFR, Est African American: 89 mL/min/{1.73_m2} (ref >=60)
GFR, Est Non African American: 77 mL/min/{1.73_m2} (ref >=60)
Globulin: 3.1 g/dL (calc) (ref 1.9–3.7)
Glucose, Bld: 234 mg/dL — ABNORMAL HIGH (ref 65–99)
Potassium: 4.4 mmol/L (ref 3.5–5.3)
Sodium: 134 mmol/L — ABNORMAL LOW (ref 135–146)
Total Bilirubin: 1.2 mg/dL (ref 0.2–1.2)
Total Protein: 7.2 g/dL (ref 6.1–8.1)

## 2020-10-23 LAB — TSH: TSH: 6.82 mIU/L — ABNORMAL HIGH (ref 0.40–4.50)

## 2020-10-23 LAB — BRAIN NATRIURETIC PEPTIDE: Brain Natriuretic Peptide: 17 pg/mL (ref <100)

## 2020-10-23 NOTE — Progress Notes (Signed)
Nicole Giles,   Magnesium on the low normal side. Start magnesium glycinate 200mg  at bedtime.   Your TSH is showing a hypo functioning thyroid. We should start low dose thyroid medication and recheck in 6 weeks to see if that could help make you feel better. JJ is going to add antibody testing to look for auto-immune attacking of thyroid. Add anti-TPO and thyroglobin with free T4 and T3.

## 2020-10-24 ENCOUNTER — Encounter: Payer: Self-pay | Admitting: Physician Assistant

## 2020-10-24 DIAGNOSIS — E039 Hypothyroidism, unspecified: Secondary | ICD-10-CM | POA: Insufficient documentation

## 2020-10-24 MED ORDER — LEVOTHYROXINE SODIUM 25 MCG PO TABS: 25.0000 ug | ORAL_TABLET | Freq: Every day | ORAL | 1 refills | Status: DC

## 2020-10-24 NOTE — Progress Notes (Signed)
[facility] BNP is normal. Shortness of breath does not appear to be coming from heart function.

## 2020-10-27 ENCOUNTER — Other Ambulatory Visit: Payer: Self-pay

## 2020-10-27 DIAGNOSIS — R778 Other specified abnormalities of plasma proteins: Secondary | ICD-10-CM | POA: Diagnosis not present

## 2020-10-27 DIAGNOSIS — D751 Secondary polycythemia: Secondary | ICD-10-CM | POA: Diagnosis not present

## 2020-10-27 DIAGNOSIS — E1165 Type 2 diabetes mellitus with hyperglycemia: Secondary | ICD-10-CM

## 2020-10-27 MED ORDER — PEN NEEDLES 31G X 8 MM MISC: 99 refills | Status: DC

## 2020-11-03 ENCOUNTER — Encounter: Payer: Self-pay | Admitting: Physician Assistant

## 2020-11-04 DIAGNOSIS — Z981 Arthrodesis status: Secondary | ICD-10-CM | POA: Diagnosis not present

## 2020-11-04 DIAGNOSIS — R1310 Dysphagia, unspecified: Secondary | ICD-10-CM | POA: Diagnosis not present

## 2020-11-06 ENCOUNTER — Other Ambulatory Visit: Payer: Self-pay

## 2020-11-06 MED ORDER — LANCETS 33G MISC: 99 refills | Status: DC

## 2020-11-06 MED ORDER — TRUE METRIX BLOOD GLUCOSE TEST VI STRP: ORAL_STRIP | 99 refills | Status: DC

## 2020-11-18 ENCOUNTER — Other Ambulatory Visit: Payer: Self-pay | Admitting: Physician Assistant

## 2020-11-18 DIAGNOSIS — E039 Hypothyroidism, unspecified: Secondary | ICD-10-CM

## 2020-11-19 ENCOUNTER — Telehealth: Payer: Self-pay | Admitting: *Deleted

## 2020-11-19 ENCOUNTER — Telehealth (INDEPENDENT_AMBULATORY_CARE_PROVIDER_SITE_OTHER): Payer: Medicare HMO | Admitting: Physician Assistant

## 2020-11-19 VITALS — Ht 59.0 in | Wt 218.0 lb

## 2020-11-19 DIAGNOSIS — E039 Hypothyroidism, unspecified: Secondary | ICD-10-CM | POA: Diagnosis not present

## 2020-11-19 DIAGNOSIS — E785 Hyperlipidemia, unspecified: Secondary | ICD-10-CM

## 2020-11-19 DIAGNOSIS — E1165 Type 2 diabetes mellitus with hyperglycemia: Secondary | ICD-10-CM

## 2020-11-19 MED ORDER — LEVOTHYROXINE SODIUM 25 MCG PO TABS: 25.0000 ug | ORAL_TABLET | Freq: Every day | ORAL | 1 refills | Status: DC

## 2020-11-19 MED ORDER — ROSUVASTATIN CALCIUM 5 MG PO TABS: 5.0000 mg | ORAL_TABLET | Freq: Every day | ORAL | 3 refills | Status: DC

## 2020-11-19 NOTE — Progress Notes (Signed)
Blood sugars running 100-160  Patient has not been checking blood pressure at home "don't have a machine"

## 2020-11-19 NOTE — Chronic Care Management (AMB) (Signed)
  Chronic Care Management   Note  11/19/2020 Name: Nicole Giles MRN: [id number] DOB: 15-Feb-1946  Nicole Giles is a 75 y.o. year old female who is a primary care patient of Donella Stade, [place]. I reached out to [name] by phone today in response to a referral sent by Ms. Ashley Mariner Flett's PCP  Donella Stade, PA-C     Ms. Sickman was given information about Chronic Care Management services today including:  CCM service includes personalized support from designated clinical staff supervised by her physician, including individualized plan of care and coordination with other care providers 24/7 contact phone numbers for assistance for urgent and routine care needs. Service will only be billed when office clinical staff spend 20 minutes or more in a month to coordinate care. Only one practitioner may furnish and bill the service in a calendar month. The patient may stop CCM services at any time (effective at the end of the month) by phone call to the office staff. The patient will be responsible for cost sharing (co-pay) of up to 20% of the service fee (after annual deductible is met).  Patient agreed to services and verbal consent obtained.   Follow up plan: Telephone appointment with care management team member scheduled for:11/26/2020  Julian Hy, [place], [place] [facility]  Direct Dial: [phone]

## 2020-11-19 NOTE — Chronic Care Management (AMB) (Signed)
  Chronic Care Management   Outreach Note  11/19/2020 Name: Nicole Giles MRN: [id number] DOB: [birth year]  Nicole Giles is a 75 y.o. year old female who is a primary care patient of Donella Stade, [place]. I reached out to [name] by phone today in response to a referral sent by [name]'s PCP Donella Stade, PA-C     An unsuccessful telephone outreach was attempted today. The patient was referred to the case management team for assistance with care management and care coordination.   Follow Up Plan: A HIPAA compliant phone message was left for the patient providing contact information and requesting a return call.  If patient returns call to provider office, please advise to call Embedded Care Management Care Guide Bernardette Waldron at [place], [place] [facility]  Direct Dial: [phone]

## 2020-11-19 NOTE — Progress Notes (Signed)
Patient ID: Nicole Giles, female   DOB: 1945/06/02, 75 y.o.   MRN: [id number] .Marland KitchenVirtual Visit via Video Note  I connected with Nicole Giles on 11/19/20 at  2:00 PM EDT by a video enabled telemedicine application and verified that I am speaking with the correct person using two identifiers.  Location: Patient: home Provider: clinic  .Marland KitchenParticipating in visit:  Patient: Nicole Giles Provider: Iran Planas PA-C   I discussed the limitations of evaluation and management by telemedicine and the availability of in person appointments. The patient expressed understanding and agreed to proceed.  History of Present Illness: Pt is a 74 yo obese female with OSA, T2DM, hypothyroidism, polymyalgia, HLD, osteoarthritis, hemochromotosis who presents to the clinic for 1 month follow up.   .. Lab Results  Component Value Date   HGBA1C 10.0 (A) 10/22/2020   Last a1c was not good and patient was not feeling well. Diabetic medications were adjusted and synthroid added for TSH. Pt is checking her sugars and under 130 most days in morning. Never started tresbia due to insulin concerns. She is taking synthroid. She already has more energy and feeling better. She is eating better foods and starting to be more active.    .. Active Ambulatory Problems    Diagnosis Date Noted   HYPERLIPIDEMIA 08/28/2007   Morbid obesity ([place]) 11/17/2006   HEADACHE, TENSION 10/12/2006   OSA on CPAP 09/21/2007   VERTIGO, BENIGN PAROXYSMAL POSITION 02/15/2007   GERD 08/22/2008   FATTY LIVER DISEASE 09/04/2008   UNSPECIFIED DISORDER OF KIDNEY AND URETER 09/02/2008   ARTHRITIS, KNEES, BILATERAL 06/01/2007   CERVICALGIA 06/13/2008   Pain in limb 11/13/2007   DIZZINESS 08/22/2008   FATIGUE 12/08/2006   DEPENDENT EDEMA, LEGS 08/25/2007   Dyspnea 08/22/2008   CHEST PAIN 06/30/2007   FREQUENCY, URINARY 09/09/2008   TRANSAMINASES, SERUM, ELEVATED 08/27/2008   INJURY OTHER AND UNSPECIFIED FINGER 10/04/2008   Hyperlipidemia LDL  goal <70 12/17/2013   Arthralgia 12/17/2013   Body aches 12/17/2013   Elevated fasting glucose 01/16/2014   Primary osteoarthritis of right knee 08/15/2014   Peripheral edema 12/31/2015   Elevated blood pressure 12/31/2015   Polymyalgia ([place]) 08/11/2016   No energy 08/19/2016   Myalgia 08/19/2016   Cough 08/19/2016   Arthritis of shoulder region, left, degenerative 09/17/2016   Weakness 09/19/2016   Elevated C-reactive protein (CRP) 09/19/2016   Acute pain of left shoulder 09/19/2016   Pain of left calf 01/16/2017   Post-traumatic osteoarthritis of right knee 11/30/2017   Fibroadenoma of right breast 05/12/2018   Diverticulosis 05/12/2018   Internal hemorrhoids 05/12/2018   Colon polyp 05/12/2018   Controlled type 2 diabetes mellitus with hyperglycemia, without long-term current use of insulin ([place]) 12/15/2018   Hereditary hemochromatosis ([place]) 04/02/2019   Nausea 04/02/2019   History of pulmonary embolus (PE) 07/13/2018   Fatty liver 06/16/2018   Baker's cyst of knee, right 11/22/2018   History of TIA (transient ischemic attack) 04/21/2020   Cervical stenosis of spinal canal 04/21/2020   Tinnitus of both ears 07/22/2020   Hypothyroidism 10/24/2020   Resolved Ambulatory Problems    Diagnosis Date Noted   PULMONARY EMBOLISM 10/12/2006   INFLUENZA DUE TO ID NOVEL H1N1 INFLUENZA VIRUS 03/20/2008   UTI 11/29/2008   Pre-diabetes 01/23/2014   Pyelonephritis 08/19/2016   Past Medical History:  Diagnosis Date   GERD (gastroesophageal reflux disease)    H/O blood clots    OBSTRUCTIVE SLEEP APNEA 09/21/2007      Observations/Objective: No acute distress  Normal mood and appearance No labored breathing.   .. Today's Vitals   11/19/20 1353  Weight: 218 lb (98.9 kg)  Height: 4\' 11"  (1.499 m)   Body mass index is 44.03 kg/m.   Assessment and Plan: Marland KitchenMarland KitchenDiagnoses and all orders for this visit:  Uncontrolled type 2 diabetes mellitus with hyperglycemia  ([place])  Hyperlipidemia LDL goal <70 -     rosuvastatin (CRESTOR) 5 MG tablet; Take 1 tablet (5 mg total) by mouth daily.  Hypothyroidism, unspecified type -     levothyroxine (SYNTHROID) 25 MCG tablet; Take 1 tablet (25 mcg total) by mouth daily.  Pt is feeling better and reporting her sugars are improving. Never started tresbia. Ok not to. Recheck A1C in 2 months.  Continue synthroid. Recheck in 2 months.  Make sure taking crestor.    Follow Up Instructions:    I discussed the assessment and treatment plan with the patient. The patient was provided an opportunity to ask questions and all were answered. The patient agreed with the plan and demonstrated an understanding of the instructions.   The patient was advised to call back or seek an in-person evaluation if the symptoms worsen or if the condition fails to improve as anticipated.    Iran Planas, PA-C

## 2020-11-26 ENCOUNTER — Telehealth: Payer: Medicare HMO

## 2020-12-01 ENCOUNTER — Encounter: Payer: Self-pay | Admitting: Physician Assistant

## 2020-12-17 ENCOUNTER — Encounter: Payer: Medicare HMO | Admitting: Physician Assistant

## 2020-12-17 ENCOUNTER — Other Ambulatory Visit: Payer: Self-pay

## 2020-12-17 ENCOUNTER — Other Ambulatory Visit: Payer: Self-pay | Admitting: Physician Assistant

## 2020-12-17 ENCOUNTER — Telehealth: Payer: Medicare HMO

## 2020-12-17 DIAGNOSIS — E039 Hypothyroidism, unspecified: Secondary | ICD-10-CM

## 2020-12-17 MED ORDER — LEVOTHYROXINE SODIUM 25 MCG PO TABS: 25.0000 ug | ORAL_TABLET | Freq: Every day | ORAL | 0 refills | Status: DC

## 2020-12-19 NOTE — Progress Notes (Signed)
Erroneous encounter

## 2020-12-20 ENCOUNTER — Other Ambulatory Visit: Payer: Self-pay | Admitting: Physician Assistant

## 2020-12-20 DIAGNOSIS — K2100 Gastro-esophageal reflux disease with esophagitis, without bleeding: Secondary | ICD-10-CM

## 2020-12-23 NOTE — Progress Notes (Signed)
Patient ID: Nicole Giles, female   DOB: [birth year], 75 y.o.   MRN: [id number] error

## 2020-12-24 NOTE — Progress Notes (Signed)
Error

## 2021-01-06 NOTE — Progress Notes (Signed)
This encounter was created in error - please disregard.

## 2021-01-08 DIAGNOSIS — H25813 Combined forms of age-related cataract, bilateral: Secondary | ICD-10-CM | POA: Diagnosis not present

## 2021-01-08 DIAGNOSIS — H1032 Unspecified acute conjunctivitis, left eye: Secondary | ICD-10-CM | POA: Diagnosis not present

## 2021-03-04 ENCOUNTER — Other Ambulatory Visit: Payer: Self-pay | Admitting: Physician Assistant

## 2021-03-09 DIAGNOSIS — I2699 Other pulmonary embolism without acute cor pulmonale: Secondary | ICD-10-CM | POA: Diagnosis not present

## 2021-03-11 ENCOUNTER — Encounter: Payer: Self-pay | Admitting: Physician Assistant

## 2021-03-11 ENCOUNTER — Ambulatory Visit (INDEPENDENT_AMBULATORY_CARE_PROVIDER_SITE_OTHER): Payer: Medicare HMO | Admitting: Physician Assistant

## 2021-03-11 ENCOUNTER — Other Ambulatory Visit: Payer: Self-pay

## 2021-03-11 VITALS — BP 122/108 | HR 95 | Temp 99.0°F | Ht 59.0 in | Wt 214.0 lb

## 2021-03-11 DIAGNOSIS — R051 Acute cough: Secondary | ICD-10-CM

## 2021-03-11 DIAGNOSIS — R0981 Nasal congestion: Secondary | ICD-10-CM

## 2021-03-11 DIAGNOSIS — J069 Acute upper respiratory infection, unspecified: Secondary | ICD-10-CM

## 2021-03-11 LAB — POCT INFLUENZA A/B
Influenza A, POC: NEGATIVE
Influenza B, POC: NEGATIVE

## 2021-03-11 MED ORDER — HYDROCOD POLST-CPM POLST ER 10-8 MG/5ML PO SUER: 5.0000 mL | Freq: Two times a day (BID) | ORAL | 0 refills | Status: DC | PRN

## 2021-03-11 MED ORDER — PREDNISONE 20 MG PO TABS: ORAL_TABLET | ORAL | 0 refills | Status: DC

## 2021-03-11 MED ORDER — BENZONATATE 200 MG PO CAPS: 200.0000 mg | ORAL_CAPSULE | Freq: Three times a day (TID) | ORAL | 0 refills | Status: DC | PRN

## 2021-03-11 NOTE — Patient Instructions (Signed)
Flu negative.  Covid testing pending.  Start tessalon pearls as needed.  Tussionex at bedtime.  Prednisone taper.   Acute Bronchitis, Adult Acute bronchitis is sudden or acute swelling of the air tubes (bronchi) in the lungs. Acute bronchitis causes these tubes to fill with mucus, which can make it hard to breathe. It can also cause coughing or wheezing. In adults, acute bronchitis usually goes away within 2 weeks. A cough caused by bronchitis may last up to 3 weeks. Smoking, allergies, and asthma can make the condition worse. What are the causes? This condition can be caused by germs and by substances that irritate the lungs, including: Cold and flu viruses. The most common cause of this condition is the virus that causes the common cold. Bacteria. Substances that irritate the lungs, including: Smoke from cigarettes and other forms of tobacco. Dust and pollen. Fumes from chemical products, gases, or burned fuel. Other materials that pollute indoor or outdoor air. Close contact with someone who has acute bronchitis. What increases the risk? The following factors may make you more likely to develop this condition: A weak body's defense system, also called the immune system. A condition that affects your lungs and breathing, such as asthma. What are the signs or symptoms? Common symptoms of this condition include: Lung and breathing problems, such as: Coughing. This may bring up clear, yellow, or green mucus from your lungs (sputum). Wheezing. Having too much mucus in your lungs (chest congestion). Having shortness of breath. A fever. Chills. Aches and pains, including: Tightness in your chest and other body aches. A sore throat. How is this diagnosed? This condition is usually diagnosed based on: Your symptoms and medical history. A physical exam. You may also have other tests, including tests to rule out other conditions, such as pneumonia. These tests include: A test of lung  function. Test of a mucus sample to look for the presence of bacteria. Tests to check the oxygen level in your blood. Blood tests. Chest X-ray. How is this treated? Most cases of acute bronchitis clear up over time without treatment. Your health care provider may recommend: Drinking more fluids. This can thin your mucus, which may improve your breathing. Using a device that gets medicine into your lungs (inhaler) to help improve breathing and control coughing. Using a vaporizer or a humidifier. These are machines that add water to the air to help you breathe better. Taking a medicine for a fever. Taking a medicine that thins mucus and clears congestion (expectorant). Taking a medicine that prevents or stops coughing (cough suppressant). Follow these instructions at home: Activity Get plenty of rest. Return to your normal activities as told by your health care provider. Ask your health care provider what activities are safe for you. Lifestyle  Drink enough fluid to keep your urine pale yellow. Do not drink alcohol. Do not use any products that contain nicotine or tobacco, such as cigarettes, e-cigarettes, and chewing tobacco. If you need help quitting, ask your health care provider. Be aware that: Your bronchitis will get worse if you smoke or breathe in other people's smoke (secondhand smoke). Your lungs will heal faster if you quit smoking. General instructions Take over-the-counter and prescription medicines only as told by your health care provider. Use an inhaler, vaporizer, or humidifier as told by your health care provider. If you have a sore throat, gargle with a salt-water mixture 3-4 times a day or as needed. To make a salt-water mixture, completely dissolve -1 tsp (3-6 g) of  salt in 1 cup (237 mL) of warm water. Take two teaspoons of honey at bedtime to lessen coughing at night. Keep all follow-up visits as told by your health care provider. This is important. How is this  prevented? To lower your risk of getting this condition again: Wash your hands often with soap and water. If soap and water are not available, use hand sanitizer. Avoid contact with people who have cold symptoms. Try not to touch your mouth, nose, or eyes with your hands. Avoid places where there are fumes from chemicals. Breathing these fumes will make your condition worse. Get the flu shot every year. Contact a health care provider if: Your symptoms do not improve after 2 weeks of treatment. You vomit more than once or twice. You have symptoms of dehydration such as: Dark urine. Dry skin or eyes. Increased thirst. Headaches. Confusion. Muscle cramps. Get help right away if you: Cough up blood. Feel pain in your chest. Have severe shortness of breath. Faint or keep feeling like you are going to faint. Have a severe headache. Have fever or chills that get worse. These symptoms may represent a serious problem that is an emergency. Do not wait to see if the symptoms will go away. Get medical help right away. Call your local emergency services (911 in the [place] Do not drive yourself to the hospital. Summary Acute bronchitis is sudden (acute) inflammation of the air tubes (bronchi) between the windpipe and the lungs. In adults, acute bronchitis usually goes away within 2 weeks, although coughing may last 3 weeks or longer. Take over-the-counter and prescription medicines only as told by your health care provider. Drink enough fluid to keep your urine pale yellow. Contact a health care provider if your symptoms do not improve after 2 weeks of treatment. Get help right away if you cough up blood, faint, or have chest pain or shortness of breath. This information is not intended to replace advice given to you by your health care provider. Make sure you discuss any questions you have with your health care provider. Document Revised: 03/26/2020 Document Reviewed: 11/17/2018 Elsevier Patient  Education  [place].

## 2021-03-11 NOTE — Progress Notes (Signed)
Subjective:    Patient ID: Nicole Giles, female    DOB: [birth year], 75 y.o.   MRN: [id number]  HPI Pt is a 75 yo obese female who presents to the clinic after coughing for 2 days. Symptoms started suddenly yesterday. She has not stopped coughing. She was not able to sleep last night. She does have some SOB when gets in coughing fit. Delsym and cough drops are not working. No fever, chills, ST, headache, sinus drainage. She does feel tired and weak. Cough is not productive. She is taking tylenol with little relief. Ears did start to pop. Pt only had one covid vaccine.   .. Active Ambulatory Problems    Diagnosis Date Noted   HYPERLIPIDEMIA 08/28/2007   Morbid obesity ([place]) 11/17/2006   HEADACHE, TENSION 10/12/2006   OSA on CPAP 09/21/2007   VERTIGO, BENIGN PAROXYSMAL POSITION 02/15/2007   GERD 08/22/2008   FATTY LIVER DISEASE 09/04/2008   UNSPECIFIED DISORDER OF KIDNEY AND URETER 09/02/2008   ARTHRITIS, KNEES, BILATERAL 06/01/2007   CERVICALGIA 06/13/2008   Pain in limb 11/13/2007   DIZZINESS 08/22/2008   FATIGUE 12/08/2006   DEPENDENT EDEMA, LEGS 08/25/2007   Dyspnea 08/22/2008   CHEST PAIN 06/30/2007   FREQUENCY, URINARY 09/09/2008   TRANSAMINASES, SERUM, ELEVATED 08/27/2008   INJURY OTHER AND UNSPECIFIED FINGER 10/04/2008   Hyperlipidemia LDL goal <70 12/17/2013   Arthralgia 12/17/2013   Body aches 12/17/2013   Elevated fasting glucose 01/16/2014   Primary osteoarthritis of right knee 08/15/2014   Peripheral edema 12/31/2015   Elevated blood pressure 12/31/2015   Polymyalgia ([place]) 08/11/2016   No energy 08/19/2016   Myalgia 08/19/2016   Cough 08/19/2016   Arthritis of shoulder region, left, degenerative 09/17/2016   Weakness 09/19/2016   Elevated C-reactive protein (CRP) 09/19/2016   Acute pain of left shoulder 09/19/2016   Pain of left calf 01/16/2017   Post-traumatic osteoarthritis of right knee 11/30/2017   Fibroadenoma of right breast 05/12/2018    Diverticulosis 05/12/2018   Internal hemorrhoids 05/12/2018   Colon polyp 05/12/2018   Controlled type 2 diabetes mellitus with hyperglycemia, without long-term current use of insulin ([place]) 12/15/2018   Hereditary hemochromatosis ([place]) 04/02/2019   Nausea 04/02/2019   History of pulmonary embolus (PE) 07/13/2018   Fatty liver 06/16/2018   Baker's cyst of knee, right 11/22/2018   History of TIA (transient ischemic attack) 04/21/2020   Cervical stenosis of spinal canal 04/21/2020   Tinnitus of both ears 07/22/2020   Hypothyroidism 10/24/2020   Resolved Ambulatory Problems    Diagnosis Date Noted   PULMONARY EMBOLISM 10/12/2006   INFLUENZA DUE TO ID NOVEL H1N1 INFLUENZA VIRUS 03/20/2008   UTI 11/29/2008   Pre-diabetes 01/23/2014   Pyelonephritis 08/19/2016   Past Medical History:  Diagnosis Date   GERD (gastroesophageal reflux disease)    H/O blood clots    OBSTRUCTIVE SLEEP APNEA 09/21/2007     Review of Systems    See HPI.  Objective:   Physical Exam Vitals reviewed.  Constitutional:      Appearance: Normal appearance. She is obese.  HENT:     Head: Normocephalic.     Right Ear: Tympanic membrane normal. There is no impacted cerumen.     Left Ear: Tympanic membrane normal. There is no impacted cerumen.     Nose: Nose normal.     Mouth/Throat:     Mouth: Mucous membranes are moist.  Eyes:     Extraocular Movements: Extraocular movements intact.     Conjunctiva/sclera: Conjunctivae  normal.     Pupils: Pupils are equal, round, and reactive to light.  Neck:     Vascular: No carotid bruit.  Cardiovascular:     Rate and Rhythm: Regular rhythm. Tachycardia present.     Pulses: Normal pulses.  Pulmonary:     Effort: Pulmonary effort is normal.     Breath sounds: Normal breath sounds.     Comments: Dry cough continuous.  Musculoskeletal:     Right lower leg: No edema.     Left lower leg: No edema.  Lymphadenopathy:     Cervical: No cervical adenopathy.   Neurological:     General: No focal deficit present.     Mental Status: She is alert and oriented to person, place, and time.  Psychiatric:        Mood and Affect: Mood normal.      .. Results for orders placed or performed in visit on 03/11/21  POCT Influenza A/B  Result Value Ref Range   Influenza A, POC Negative Negative   Influenza B, POC Negative Negative       Assessment & Plan:  Marland KitchenMarland KitchenIllyria was seen today for cough.  Diagnoses and all orders for this visit:  URI with cough and congestion -     POCT Influenza A/B -     chlorpheniramine-HYDROcodone (TUSSIONEX) 10-8 MG/5ML SUER; Take 5 mLs by mouth every 12 (twelve) hours as needed. -     benzonatate (TESSALON) 200 MG capsule; Take 1 capsule (200 mg total) by mouth 3 (three) times daily as needed for cough. -     predniSONE (DELTASONE) 20 MG tablet; Take 3 tablets for 3 days, then 2 tablets for 3 days, then 1 tablet for 3 days, then 1/2 tablet for 4 days. -     Novel Coronavirus, NAA ([facility])  Acute cough -     POCT Influenza A/B -     chlorpheniramine-HYDROcodone (TUSSIONEX) 10-8 MG/5ML SUER; Take 5 mLs by mouth every 12 (twelve) hours as needed. -     benzonatate (TESSALON) 200 MG capsule; Take 1 capsule (200 mg total) by mouth 3 (three) times daily as needed for cough. -     predniSONE (DELTASONE) 20 MG tablet; Take 3 tablets for 3 days, then 2 tablets for 3 days, then 1 tablet for 3 days, then 1/2 tablet for 4 days. -     Novel Coronavirus, NAA ([facility])  Congestion of nasal sinus -     POCT Influenza A/B -     chlorpheniramine-HYDROcodone (TUSSIONEX) 10-8 MG/5ML SUER; Take 5 mLs by mouth every 12 (twelve) hours as needed. -     benzonatate (TESSALON) 200 MG capsule; Take 1 capsule (200 mg total) by mouth 3 (three) times daily as needed for cough. -     predniSONE (DELTASONE) 20 MG tablet; Take 3 tablets for 3 days, then 2 tablets for 3 days, then 1 tablet for 3 days, then 1/2 tablet for 4 days. -     Novel  Coronavirus, NAA ([facility])  Flu negative.  Home covid test negative.  PCR covid pending.  Likely viral.  Sent prednisone, albuterol, tessalon and tussionex for cough.  Continue other symptomatic care.  Rest and hydrate.  Follow up as needed or if symptoms persist.

## 2021-03-13 ENCOUNTER — Other Ambulatory Visit: Payer: Self-pay | Admitting: Physician Assistant

## 2021-03-13 LAB — NOVEL CORONAVIRUS, NAA: SARS-CoV-2, NAA: NOT DETECTED

## 2021-03-13 LAB — SARS-COV-2, NAA 2 DAY TAT

## 2021-03-13 MED ORDER — ALBUTEROL SULFATE HFA 108 (90 BASE) MCG/ACT IN AERS: 2.0000 | INHALATION_SPRAY | RESPIRATORY_TRACT | 0 refills | Status: DC | PRN

## 2021-03-13 NOTE — Progress Notes (Signed)
Negative for covid. How are you feeling today?

## 2021-03-13 NOTE — Progress Notes (Signed)
Added albuterol inhaler to use every 4-6 hours as needed for cough and SOB. You need to go to hospital if cannot keep O2 stats over 90 percent at rest.

## 2021-03-13 NOTE — Progress Notes (Signed)
Added albuterol. Hospital if cannot keep pulse ox over 90 percent.

## 2021-03-13 NOTE — Addendum Note (Signed)
Addended byAnnamaria Helling on: 03/13/2021 04:26 PM   Modules accepted: Orders

## 2021-03-24 ENCOUNTER — Other Ambulatory Visit: Payer: Self-pay | Admitting: Physician Assistant

## 2021-03-24 DIAGNOSIS — E1165 Type 2 diabetes mellitus with hyperglycemia: Secondary | ICD-10-CM

## 2021-03-24 DIAGNOSIS — R0981 Nasal congestion: Secondary | ICD-10-CM

## 2021-03-24 DIAGNOSIS — R051 Acute cough: Secondary | ICD-10-CM

## 2021-03-24 DIAGNOSIS — J069 Acute upper respiratory infection, unspecified: Secondary | ICD-10-CM

## 2021-03-24 NOTE — Telephone Encounter (Signed)
I am unsure of appropriateness of refills for Tessalon and Albuterol as they were just prescribed on 03/11/2021 and 03/13/2021.  Please review for appropriateness of refill requests.  Charyl Bigger, CMA

## 2021-04-15 DIAGNOSIS — H25813 Combined forms of age-related cataract, bilateral: Secondary | ICD-10-CM | POA: Diagnosis not present

## 2021-04-15 DIAGNOSIS — E119 Type 2 diabetes mellitus without complications: Secondary | ICD-10-CM | POA: Diagnosis not present

## 2021-04-15 DIAGNOSIS — H35033 Hypertensive retinopathy, bilateral: Secondary | ICD-10-CM | POA: Diagnosis not present

## 2021-04-15 DIAGNOSIS — H527 Unspecified disorder of refraction: Secondary | ICD-10-CM | POA: Diagnosis not present

## 2021-04-15 DIAGNOSIS — H02831 Dermatochalasis of right upper eyelid: Secondary | ICD-10-CM | POA: Diagnosis not present

## 2021-04-15 DIAGNOSIS — H02834 Dermatochalasis of left upper eyelid: Secondary | ICD-10-CM | POA: Diagnosis not present

## 2021-04-15 DIAGNOSIS — H52203 Unspecified astigmatism, bilateral: Secondary | ICD-10-CM | POA: Diagnosis not present

## 2021-05-08 DIAGNOSIS — H52203 Unspecified astigmatism, bilateral: Secondary | ICD-10-CM | POA: Diagnosis not present

## 2021-05-08 DIAGNOSIS — H25813 Combined forms of age-related cataract, bilateral: Secondary | ICD-10-CM | POA: Diagnosis not present

## 2021-05-16 ENCOUNTER — Other Ambulatory Visit: Payer: Self-pay | Admitting: Physician Assistant

## 2021-05-16 DIAGNOSIS — E039 Hypothyroidism, unspecified: Secondary | ICD-10-CM

## 2021-05-21 DIAGNOSIS — H52223 Regular astigmatism, bilateral: Secondary | ICD-10-CM | POA: Diagnosis not present

## 2021-05-21 DIAGNOSIS — E785 Hyperlipidemia, unspecified: Secondary | ICD-10-CM | POA: Diagnosis not present

## 2021-05-21 DIAGNOSIS — H52221 Regular astigmatism, right eye: Secondary | ICD-10-CM | POA: Diagnosis not present

## 2021-05-21 DIAGNOSIS — H25811 Combined forms of age-related cataract, right eye: Secondary | ICD-10-CM | POA: Diagnosis not present

## 2021-05-21 DIAGNOSIS — H02831 Dermatochalasis of right upper eyelid: Secondary | ICD-10-CM | POA: Diagnosis not present

## 2021-05-21 DIAGNOSIS — E039 Hypothyroidism, unspecified: Secondary | ICD-10-CM | POA: Diagnosis not present

## 2021-05-21 DIAGNOSIS — G4730 Sleep apnea, unspecified: Secondary | ICD-10-CM | POA: Diagnosis not present

## 2021-05-21 DIAGNOSIS — K219 Gastro-esophageal reflux disease without esophagitis: Secondary | ICD-10-CM | POA: Diagnosis not present

## 2021-05-21 DIAGNOSIS — H02834 Dermatochalasis of left upper eyelid: Secondary | ICD-10-CM | POA: Diagnosis not present

## 2021-05-21 DIAGNOSIS — E7849 Other hyperlipidemia: Secondary | ICD-10-CM | POA: Diagnosis not present

## 2021-05-21 DIAGNOSIS — E1136 Type 2 diabetes mellitus with diabetic cataract: Secondary | ICD-10-CM | POA: Diagnosis not present

## 2021-05-21 DIAGNOSIS — H25813 Combined forms of age-related cataract, bilateral: Secondary | ICD-10-CM | POA: Diagnosis not present

## 2021-05-21 DIAGNOSIS — Z6841 Body Mass Index (BMI) 40.0 and over, adult: Secondary | ICD-10-CM | POA: Diagnosis not present

## 2021-05-22 LAB — HM DIABETES EYE EXAM

## 2021-06-18 ENCOUNTER — Telehealth: Payer: Self-pay

## 2021-06-18 DIAGNOSIS — Z201 Contact with and (suspected) exposure to tuberculosis: Secondary | ICD-10-CM

## 2021-06-18 NOTE — Telephone Encounter (Signed)
Patient called in and states she was just exposed to TB. 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 The employee has been at work all week. He tested on Tuesday and had a positive result today. Currently, she has no symptoms. Advised her to wear a mask and went over what symptoms to pay attention to.   Please advise

## 2021-06-19 NOTE — Telephone Encounter (Signed)
Patient made aware, order placed for labs, she will come Monday to have drawn.

## 2021-06-19 NOTE — Telephone Encounter (Signed)
Ok to order [employer] for TB testing if negative we can recheck again if becomes symptomatic or 6 weeks to make sure not testing too soon.

## 2021-06-20 ENCOUNTER — Other Ambulatory Visit: Payer: Self-pay | Admitting: Physician Assistant

## 2021-06-20 DIAGNOSIS — E039 Hypothyroidism, unspecified: Secondary | ICD-10-CM

## 2021-07-16 DIAGNOSIS — E039 Hypothyroidism, unspecified: Secondary | ICD-10-CM | POA: Diagnosis not present

## 2021-07-16 DIAGNOSIS — K219 Gastro-esophageal reflux disease without esophagitis: Secondary | ICD-10-CM | POA: Diagnosis not present

## 2021-07-16 DIAGNOSIS — H25811 Combined forms of age-related cataract, right eye: Secondary | ICD-10-CM | POA: Diagnosis not present

## 2021-07-16 DIAGNOSIS — Z6841 Body Mass Index (BMI) 40.0 and over, adult: Secondary | ICD-10-CM | POA: Diagnosis not present

## 2021-07-16 DIAGNOSIS — H52221 Regular astigmatism, right eye: Secondary | ICD-10-CM | POA: Diagnosis not present

## 2021-07-16 DIAGNOSIS — G43909 Migraine, unspecified, not intractable, without status migrainosus: Secondary | ICD-10-CM | POA: Diagnosis not present

## 2021-07-16 DIAGNOSIS — Z79899 Other long term (current) drug therapy: Secondary | ICD-10-CM | POA: Diagnosis not present

## 2021-07-16 DIAGNOSIS — H25812 Combined forms of age-related cataract, left eye: Secondary | ICD-10-CM | POA: Diagnosis not present

## 2021-07-16 DIAGNOSIS — E785 Hyperlipidemia, unspecified: Secondary | ICD-10-CM | POA: Diagnosis not present

## 2021-07-16 DIAGNOSIS — Z888 Allergy status to other drugs, medicaments and biological substances status: Secondary | ICD-10-CM | POA: Diagnosis not present

## 2021-07-16 DIAGNOSIS — E119 Type 2 diabetes mellitus without complications: Secondary | ICD-10-CM | POA: Diagnosis not present

## 2021-07-16 DIAGNOSIS — Z885 Allergy status to narcotic agent status: Secondary | ICD-10-CM | POA: Diagnosis not present

## 2021-07-16 DIAGNOSIS — E1136 Type 2 diabetes mellitus with diabetic cataract: Secondary | ICD-10-CM | POA: Diagnosis not present

## 2021-07-16 DIAGNOSIS — G4730 Sleep apnea, unspecified: Secondary | ICD-10-CM | POA: Diagnosis not present

## 2021-07-27 ENCOUNTER — Other Ambulatory Visit: Payer: Self-pay | Admitting: Physician Assistant

## 2021-07-27 DIAGNOSIS — E039 Hypothyroidism, unspecified: Secondary | ICD-10-CM

## 2021-07-27 NOTE — Telephone Encounter (Signed)
Patient has been scheduled for a f/u with [facility] for tomorrow. AM ?

## 2021-07-28 ENCOUNTER — Ambulatory Visit: Payer: Medicare PPO | Admitting: Physician Assistant

## 2021-07-28 DIAGNOSIS — E039 Hypothyroidism, unspecified: Secondary | ICD-10-CM

## 2021-07-28 DIAGNOSIS — E785 Hyperlipidemia, unspecified: Secondary | ICD-10-CM

## 2021-07-28 DIAGNOSIS — E1165 Type 2 diabetes mellitus with hyperglycemia: Secondary | ICD-10-CM

## 2021-08-10 ENCOUNTER — Ambulatory Visit: Payer: Medicare PPO | Admitting: Physician Assistant

## 2021-08-12 ENCOUNTER — Telehealth: Payer: Self-pay | Admitting: Neurology

## 2021-08-12 ENCOUNTER — Encounter: Payer: Self-pay | Admitting: Physician Assistant

## 2021-08-12 ENCOUNTER — Telehealth: Payer: Self-pay

## 2021-08-12 ENCOUNTER — Ambulatory Visit (INDEPENDENT_AMBULATORY_CARE_PROVIDER_SITE_OTHER): Payer: Medicare PPO | Admitting: Physician Assistant

## 2021-08-12 VITALS — BP 130/63 | HR 71 | Resp 18 | Ht 59.0 in | Wt 214.0 lb

## 2021-08-12 DIAGNOSIS — E1165 Type 2 diabetes mellitus with hyperglycemia: Secondary | ICD-10-CM

## 2021-08-12 DIAGNOSIS — E039 Hypothyroidism, unspecified: Secondary | ICD-10-CM

## 2021-08-12 DIAGNOSIS — Z79899 Other long term (current) drug therapy: Secondary | ICD-10-CM | POA: Diagnosis not present

## 2021-08-12 DIAGNOSIS — E785 Hyperlipidemia, unspecified: Secondary | ICD-10-CM | POA: Diagnosis not present

## 2021-08-12 DIAGNOSIS — R059 Cough, unspecified: Secondary | ICD-10-CM

## 2021-08-12 DIAGNOSIS — M19049 Primary osteoarthritis, unspecified hand: Secondary | ICD-10-CM

## 2021-08-12 DIAGNOSIS — R600 Localized edema: Secondary | ICD-10-CM | POA: Diagnosis not present

## 2021-08-12 LAB — POCT GLYCOSYLATED HEMOGLOBIN (HGB A1C): Hemoglobin A1C: 7.3 % — AB (ref 4.0–5.6)

## 2021-08-12 MED ORDER — ROSUVASTATIN CALCIUM 5 MG PO TABS: 5.0000 mg | ORAL_TABLET | Freq: Every day | ORAL | 3 refills | Status: DC

## 2021-08-12 MED ORDER — TRIAMCINOLONE ACETONIDE 0.025 % EX CREA: TOPICAL_CREAM | Freq: Two times a day (BID) | CUTANEOUS | 1 refills | Status: DC

## 2021-08-12 MED ORDER — LEVOTHYROXINE SODIUM 25 MCG PO TABS: ORAL_TABLET | ORAL | 0 refills | Status: DC

## 2021-08-12 MED ORDER — ESOMEPRAZOLE MAGNESIUM 40 MG PO CPDR: 40.0000 mg | DELAYED_RELEASE_CAPSULE | Freq: Every day | ORAL | 3 refills | Status: DC

## 2021-08-12 MED ORDER — DICLOFENAC SODIUM 1 % EX GEL: 4.0000 g | Freq: Four times a day (QID) | CUTANEOUS | 1 refills | Status: DC

## 2021-08-12 MED ORDER — XIGDUO XR 10-1000 MG PO TB24: 1.0000 | ORAL_TABLET | Freq: Every day | ORAL | 2 refills | Status: DC

## 2021-08-12 MED ORDER — HYDROCHLOROTHIAZIDE 12.5 MG PO TABS: 12.5000 mg | ORAL_TABLET | Freq: Every day | ORAL | 1 refills | Status: DC

## 2021-08-12 NOTE — Progress Notes (Signed)
? ?Subjective:  ? ? Patient ID: Nicole Giles, female    DOB: 1945/08/22, 76 y.o.   MRN: [id number] ? ?HPI ?Pt is a 76 yo obese female with bilateral leg swelling  for 2 weeks, joint pain in hands and knees, bilateral leg cramping  and needs medications.  ? ?She is only taking ozempic when she feels like her sugars are high.  ? ?Last few months she has felt like she stays "swollen".  ? ?.. ?Active Ambulatory Problems  ?  Diagnosis Date Noted  ? HYPERLIPIDEMIA 08/28/2007  ? Morbid obesity ([place]) 11/17/2006  ? HEADACHE, TENSION 10/12/2006  ? OSA on CPAP 09/21/2007  ? VERTIGO, BENIGN PAROXYSMAL POSITION 02/15/2007  ? GERD 08/22/2008  ? FATTY LIVER DISEASE 09/04/2008  ? UNSPECIFIED DISORDER OF KIDNEY AND URETER 09/02/2008  ? ARTHRITIS, KNEES, BILATERAL 06/01/2007  ? CERVICALGIA 06/13/2008  ? Pain in limb 11/13/2007  ? DIZZINESS 08/22/2008  ? FATIGUE 12/08/2006  ? DEPENDENT EDEMA, LEGS 08/25/2007  ? Dyspnea 08/22/2008  ? CHEST PAIN 06/30/2007  ? FREQUENCY, URINARY 09/09/2008  ? TRANSAMINASES, SERUM, ELEVATED 08/27/2008  ? INJURY OTHER AND UNSPECIFIED FINGER 10/04/2008  ? Hyperlipidemia LDL goal <70 12/17/2013  ? Arthralgia 12/17/2013  ? Body aches 12/17/2013  ? Elevated fasting glucose 01/16/2014  ? Primary osteoarthritis of right knee 08/15/2014  ? Peripheral edema 12/31/2015  ? Elevated blood pressure 12/31/2015  ? Polymyalgia ([place]) 08/11/2016  ? No energy 08/19/2016  ? Myalgia 08/19/2016  ? Cough 08/19/2016  ? Arthritis of shoulder region, left, degenerative 09/17/2016  ? Weakness 09/19/2016  ? Elevated C-reactive protein (CRP) 09/19/2016  ? Acute pain of left shoulder 09/19/2016  ? Pain of left calf 01/16/2017  ? Post-traumatic osteoarthritis of right knee 11/30/2017  ? Fibroadenoma of right breast 05/12/2018  ? Diverticulosis 05/12/2018  ? Internal hemorrhoids 05/12/2018  ? Colon polyp 05/12/2018  ? Controlled type 2 diabetes mellitus with hyperglycemia, without long-term current use of insulin ([place]) 12/15/2018  ?  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 Nausea 04/02/2019  ? History of pulmonary embolus (PE) 07/13/2018  ? Fatty liver 06/16/2018  ? Baker's cyst of knee, right 11/22/2018  ? History of TIA (transient ischemic attack) 04/21/2020  ? Cervical stenosis of spinal canal 04/21/2020  ? Tinnitus of both ears 07/22/2020  ? Hypothyroidism 10/24/2020  ? ?Resolved Ambulatory Problems  ?  Diagnosis Date Noted  ? PULMONARY EMBOLISM 10/12/2006  ? INFLUENZA DUE TO ID NOVEL H1N1 INFLUENZA VIRUS 03/20/2008  ? UTI 11/29/2008  ? Pre-diabetes 01/23/2014  ? Pyelonephritis 08/19/2016  ? ?Past Medical History:  ?Diagnosis Date  ? GERD (gastroesophageal reflux disease)   ? H/O blood clots   ? OBSTRUCTIVE SLEEP APNEA 09/21/2007  ? ? ? ? ?Review of Systems ?See HPI>  ?   ?Objective:  ? Physical Exam ?Vitals reviewed.  ?Constitutional:   ?   Appearance: Normal appearance. She is obese.  ?HENT:  ?   Head: Normocephalic.  ?Cardiovascular:  ?   Rate and Rhythm: Normal rate and regular rhythm.  ?Pulmonary:  ?   Effort: Pulmonary effort is normal.  ?Musculoskeletal:  ?   Right lower leg: Edema present.  ?   Left lower leg: Edema present.  ?Neurological:  ?   General: No focal deficit present.  ?   Mental Status: She is alert.  ?Psychiatric:     ?   Mood and Affect: Mood normal.  ? ? ? ? ? ? ? ?   ?Assessment & Plan:  ?..Ninfa was  seen today for leg swelling, hand concern, cramps and diabetes. ? ?Diagnoses and all orders for this visit: ? ?Uncontrolled type 2 diabetes mellitus with hyperglycemia (HCC) ?-     POCT glycosylated hemoglobin (Hb A1C) ?-     COMPLETE METABOLIC PANEL WITH GFR ?-     Dapagliflozin-metFORMIN HCl ER (XIGDUO XR) 02-999 MG TB24; Take 1 tablet by mouth daily. For diabetes. ?-     POCT UA - Microalbumin ? ?Hypothyroidism, unspecified type ?-     TSH ?-     levothyroxine (SYNTHROID) 25 MCG tablet; TAKE 1 TABLET BY MOUTH ONCE DAILY -  NEED  LABS  FOR  FUTHER  REFILLS ? ?Hyperlipidemia LDL goal <70 ?-     Lipid Panel  w/reflex Direct LDL ?-     rosuvastatin (CRESTOR) 5 MG tablet; Take 1 tablet (5 mg total) by mouth daily. ? ?Medication management ?-     POCT glycosylated hemoglobin (Hb A1C) ?-     Lipid Panel w/reflex Direct LDL ?-     COMPLETE METABOLIC PANEL WITH GFR ?-     TSH ? ?Cough ?-     esomeprazole (NEXIUM) 40 MG capsule; Take 1 capsule (40 mg total) by mouth daily at 12 noon. ? ?Hand arthritis ?-     diclofenac Sodium (VOLTAREN) 1 % GEL; Apply 4 g topically 4 (four) times daily. To affected joint. ? ?Lower extremity edema ?-     hydrochlorothiazide (HYDRODIURIL) 12.5 MG tablet; Take 1 tablet (12.5 mg total) by mouth daily. As needed for swelling. ? ?Other orders ?-     triamcinolone (KENALOG) 0.025 % cream; Apply topically 2 (two) times daily. ? ? ?HCTZ as needed for swelling ?Need compression stockings ?Diclofenac for joint pain ?Discussed chronic venous stasis ?Start xigduo daily for diabetes ?

## 2021-08-12 NOTE — Telephone Encounter (Signed)
Patient called and left VM stating Nicole Giles is $310. Did you get PA request for this medication?  ?

## 2021-08-12 NOTE — Telephone Encounter (Signed)
Initiated Prior authorization MOQ:HUTMLY XR 10-'1000MG'$  er tablets ?Via: Covermymeds ?Case/Key:[id number] ?Status: approved as of  08/12/21 ?Reason:Pt has a high co-pay $316 ?Notified Pt via: Mychart ? ?

## 2021-08-12 NOTE — Patient Instructions (Signed)
HCTZ for swelling as needed ?Compression stockings with zipper ?Diclofenac for joint pain and arthritis rub over area ?Xigduo daily for diabetes ? ?Chronic Venous Insufficiency ?Chronic venous insufficiency is a condition where the leg veins cannot effectively pump blood from the legs to the heart. This happens when the vein walls are either stretched, weakened, or damaged, or when the valves inside the vein are damaged. With the right treatment, you should be able to continue with an active life. This condition is also called venous stasis. ?What are the causes? ?Common causes of this condition include: ?High blood pressure inside the veins (venous hypertension). ?Sitting or standing too long, causing increased blood pressure in the leg veins. ?A blood clot that blocks blood flow in a vein (deep vein thrombosis, DVT). ?Inflammation of a vein (phlebitis) that causes a blood clot to form. ?Tumors in the pelvis that cause blood to back up. ?What increases the risk? ?The following factors may make you more likely to develop this condition: ?Having a family history of this condition. ?Obesity. ?Pregnancy. ?Living without enough regular physical activity or exercise (sedentary lifestyle). ?Smoking. ?Having a job that requires long periods of standing or sitting in one place. ?Being a certain age. Women in their 39s and 71s and men in their 51s are more likely to develop this condition. ?What are the signs or symptoms? ?Symptoms of this condition include: ?Veins that are enlarged, bulging, or twisted (varicose veins). ?Skin breakdown or ulcers. ?Reddened skin or dark discoloration of skin on the leg between the knee and ankle. ?Brown, smooth, tight, and painful skin just above the ankle, usually on the inside of the leg (lipodermatosclerosis). ?Swelling of the legs. ?How is this diagnosed? ?This condition may be diagnosed based on: ?Your medical history. ?A physical exam. ?Tests, such as: ?A procedure that creates an  image of a blood vessel and nearby organs and provides information about blood flow through the blood vessel (duplex ultrasound). ?A procedure that tests blood flow (plethysmography). ?A procedure that looks at the veins using X-ray and dye (venogram). ?How is this treated? ?The goals of treatment are to help you return to an active life and to minimize pain or disability. Treatment depends on the severity of your condition, and it may include: ?Wearing compression stockings. These can help relieve symptoms and help prevent your condition from getting worse. However, they do not cure the condition. ?Sclerotherapy. This procedure involves an injection of a solution that shrinks damaged veins. ?Surgery. This may involve: ?Removing a diseased vein (vein stripping). ?Cutting off blood flow through the vein (laser ablation surgery). ?Repairing or reconstructing a valve within the affected vein. ?Follow these instructions at home: ?  ?Wear compression stockings as told by your health care provider. These stockings help to prevent blood clots and reduce swelling in your legs. ?Take over-the-counter and prescription medicines only as told by your health care provider. ?Stay active by exercising, walking, or doing different activities. Ask your health care provider what activities are safe for you and how much exercise you need. ?Drink enough fluid to keep your urine pale yellow. ?Do not use any products that contain nicotine or tobacco, such as cigarettes, e-cigarettes, and chewing tobacco. If you need help quitting, ask your health care provider. ?Keep all follow-up visits as told by your health care provider. This is important. ?Contact a health care provider if you: ?Have redness, swelling, or more pain in the affected area. ?See a red streak or line that goes up or down  from the affected area. ?Have skin breakdown or skin loss in the affected area, even if the breakdown is small. ?Get an injury in the affected  area. ?Get help right away if: ?You get an injury and an open wound in the affected area. ?You have: ?Severe pain that does not get better with medicine. ?Sudden numbness or weakness in the foot or ankle below the affected area. ?Trouble moving your foot or ankle. ?A fever. ?Worse or persistent symptoms. ?Chest pain. ?Shortness of breath. ?Summary ?Chronic venous insufficiency is a condition where the leg veins cannot effectively pump blood from the legs to the heart. ?Chronic venous insufficiency occurs when the vein walls become stretched, weakened, or damaged, or when valves within the vein are damaged. ?Treatment depends on how severe your condition is. It often involves wearing compression stockings and may involve having a procedure. ?Make sure you stay active by exercising, walking, or doing different activities. Ask your health care provider what activities are safe for you and how much exercise you need. ?This information is not intended to replace advice given to you by your health care provider. Make sure you discuss any questions you have with your health care provider. ?Document Revised: 07/08/2020 Document Reviewed: 07/08/2020 ?Elsevier Patient Education ? [place]. ? ?

## 2021-08-13 NOTE — Telephone Encounter (Signed)
Cost too much even with insurance approval, please advise.  ?

## 2021-08-13 NOTE — Telephone Encounter (Signed)
[employer] will contact patient with savings program, documented in alternative note.  ?

## 2021-08-18 ENCOUNTER — Encounter: Payer: Self-pay | Admitting: Physician Assistant

## 2021-08-18 LAB — POCT UA - MICROALBUMIN

## 2021-08-31 DIAGNOSIS — M47817 Spondylosis without myelopathy or radiculopathy, lumbosacral region: Secondary | ICD-10-CM | POA: Diagnosis not present

## 2021-08-31 DIAGNOSIS — M5137 Other intervertebral disc degeneration, lumbosacral region: Secondary | ICD-10-CM | POA: Diagnosis not present

## 2021-08-31 DIAGNOSIS — Z6841 Body Mass Index (BMI) 40.0 and over, adult: Secondary | ICD-10-CM | POA: Diagnosis not present

## 2021-08-31 DIAGNOSIS — M5136 Other intervertebral disc degeneration, lumbar region: Secondary | ICD-10-CM | POA: Diagnosis not present

## 2021-08-31 DIAGNOSIS — Z981 Arthrodesis status: Secondary | ICD-10-CM | POA: Diagnosis not present

## 2021-08-31 DIAGNOSIS — T148XXA Other injury of unspecified body region, initial encounter: Secondary | ICD-10-CM | POA: Diagnosis not present

## 2021-08-31 DIAGNOSIS — M4317 Spondylolisthesis, lumbosacral region: Secondary | ICD-10-CM | POA: Diagnosis not present

## 2021-08-31 DIAGNOSIS — M545 Low back pain, unspecified: Secondary | ICD-10-CM | POA: Diagnosis not present

## 2021-09-18 ENCOUNTER — Other Ambulatory Visit: Payer: Self-pay | Admitting: Physician Assistant

## 2021-09-18 DIAGNOSIS — E039 Hypothyroidism, unspecified: Secondary | ICD-10-CM

## 2021-10-02 DIAGNOSIS — Z86711 Personal history of pulmonary embolism: Secondary | ICD-10-CM | POA: Diagnosis not present

## 2021-10-08 ENCOUNTER — Ambulatory Visit (INDEPENDENT_AMBULATORY_CARE_PROVIDER_SITE_OTHER): Payer: Medicare PPO | Admitting: Physician Assistant

## 2021-10-08 ENCOUNTER — Encounter: Payer: Self-pay | Admitting: Physician Assistant

## 2021-10-08 VITALS — BP 142/90 | HR 88

## 2021-10-08 DIAGNOSIS — R239 Unspecified skin changes: Secondary | ICD-10-CM | POA: Diagnosis not present

## 2021-10-08 DIAGNOSIS — M5134 Other intervertebral disc degeneration, thoracic region: Secondary | ICD-10-CM | POA: Diagnosis not present

## 2021-10-08 DIAGNOSIS — Z79899 Other long term (current) drug therapy: Secondary | ICD-10-CM | POA: Diagnosis not present

## 2021-10-08 DIAGNOSIS — D1801 Hemangioma of skin and subcutaneous tissue: Secondary | ICD-10-CM | POA: Diagnosis not present

## 2021-10-08 DIAGNOSIS — M545 Low back pain, unspecified: Secondary | ICD-10-CM | POA: Diagnosis not present

## 2021-10-08 DIAGNOSIS — Z981 Arthrodesis status: Secondary | ICD-10-CM | POA: Diagnosis not present

## 2021-10-08 DIAGNOSIS — G479 Sleep disorder, unspecified: Secondary | ICD-10-CM | POA: Diagnosis not present

## 2021-10-08 DIAGNOSIS — M546 Pain in thoracic spine: Secondary | ICD-10-CM | POA: Diagnosis not present

## 2021-10-08 DIAGNOSIS — M542 Cervicalgia: Secondary | ICD-10-CM | POA: Diagnosis not present

## 2021-10-08 DIAGNOSIS — E1165 Type 2 diabetes mellitus with hyperglycemia: Secondary | ICD-10-CM | POA: Insufficient documentation

## 2021-10-08 DIAGNOSIS — M5031 Other cervical disc degeneration,  high cervical region: Secondary | ICD-10-CM | POA: Diagnosis not present

## 2021-10-08 DIAGNOSIS — M4312 Spondylolisthesis, cervical region: Secondary | ICD-10-CM | POA: Diagnosis not present

## 2021-10-08 DIAGNOSIS — L814 Other melanin hyperpigmentation: Secondary | ICD-10-CM | POA: Diagnosis not present

## 2021-10-08 DIAGNOSIS — S91209A Unspecified open wound of unspecified toe(s) with damage to nail, initial encounter: Secondary | ICD-10-CM

## 2021-10-08 DIAGNOSIS — E785 Hyperlipidemia, unspecified: Secondary | ICD-10-CM | POA: Diagnosis not present

## 2021-10-08 DIAGNOSIS — S99929A Unspecified injury of unspecified foot, initial encounter: Secondary | ICD-10-CM | POA: Diagnosis not present

## 2021-10-08 DIAGNOSIS — M47814 Spondylosis without myelopathy or radiculopathy, thoracic region: Secondary | ICD-10-CM | POA: Diagnosis not present

## 2021-10-08 DIAGNOSIS — E039 Hypothyroidism, unspecified: Secondary | ICD-10-CM | POA: Diagnosis not present

## 2021-10-08 MED ORDER — EMPAGLIFLOZIN 25 MG PO TABS: 25.0000 mg | ORAL_TABLET | Freq: Every day | ORAL | 2 refills | Status: DC

## 2021-10-08 MED ORDER — GLIPIZIDE 5 MG PO TABS: 5.0000 mg | ORAL_TABLET | Freq: Two times a day (BID) | ORAL | 2 refills | Status: DC

## 2021-10-08 MED ORDER — AMITRIPTYLINE HCL 25 MG PO TABS: ORAL_TABLET | ORAL | 2 refills | Status: DC

## 2021-10-08 MED ORDER — DOXYCYCLINE HYCLATE 100 MG PO TABS: 100.0000 mg | ORAL_TABLET | Freq: Two times a day (BID) | ORAL | 0 refills | Status: DC

## 2021-10-08 MED ORDER — METFORMIN HCL 500 MG PO TABS: 500.0000 mg | ORAL_TABLET | Freq: Two times a day (BID) | ORAL | 2 refills | Status: DC

## 2021-10-08 NOTE — Progress Notes (Signed)
Established Patient Office Visit  Subjective   Patient ID: Nicole Giles, female    DOB: 1945/05/19  Age: 76 y.o. MRN: [id number]  Chief Complaint  Patient presents with   Diabetes    HPI Pt is a 76 yo obese female with OSA, T2DM, hypothyroidism, polymyalgia, chronic pain due to DDD who presents to the clinic with medication concerns.   She cannot afford ozempic and xiduo and stopped taking them. Her sugars have been 190 to 300s. She needs new medications. She did not like ozempic anyways because it made her sick.   She cannot sleep well. She is sleeping in a chair due to neck cervical DDD. She is fatigued.   She stubbed her great right toe and now her toenail is almost falling off.   She concerned about skin changes. She would like referral.   .. Active Ambulatory Problems    Diagnosis Date Noted   HYPERLIPIDEMIA 08/28/2007   Morbid obesity ([place]) 11/17/2006   HEADACHE, TENSION 10/12/2006   OSA on CPAP 09/21/2007   VERTIGO, BENIGN PAROXYSMAL POSITION 02/15/2007   GERD 08/22/2008   FATTY LIVER DISEASE 09/04/2008   UNSPECIFIED DISORDER OF KIDNEY AND URETER 09/02/2008   ARTHRITIS, KNEES, BILATERAL 06/01/2007   CERVICALGIA 06/13/2008   Pain in limb 11/13/2007   DIZZINESS 08/22/2008   FATIGUE 12/08/2006   DEPENDENT EDEMA, LEGS 08/25/2007   Dyspnea 08/22/2008   CHEST PAIN 06/30/2007   FREQUENCY, URINARY 09/09/2008   TRANSAMINASES, SERUM, ELEVATED 08/27/2008   INJURY OTHER AND UNSPECIFIED FINGER 10/04/2008   Hyperlipidemia LDL goal <70 12/17/2013   Arthralgia 12/17/2013   Body aches 12/17/2013   Elevated fasting glucose 01/16/2014   Primary osteoarthritis of right knee 08/15/2014   Peripheral edema 12/31/2015   Elevated blood pressure 12/31/2015   Polymyalgia ([place]) 08/11/2016   No energy 08/19/2016   Myalgia 08/19/2016   Cough 08/19/2016   Arthritis of shoulder region, left, degenerative 09/17/2016   Weakness 09/19/2016   Elevated C-reactive protein (CRP)  09/19/2016   Acute pain of left shoulder 09/19/2016   Pain of left calf 01/16/2017   Post-traumatic osteoarthritis of right knee 11/30/2017   Fibroadenoma of right breast 05/12/2018   Diverticulosis 05/12/2018   Internal hemorrhoids 05/12/2018   Colon polyp 05/12/2018   Controlled type 2 diabetes mellitus with hyperglycemia, without long-term current use of insulin ([place]) 12/15/2018   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   Nausea 04/02/2019   History of pulmonary embolus (PE) 07/13/2018   Fatty liver 06/16/2018   Baker's cyst of knee, right 11/22/2018   History of TIA (transient ischemic attack) 04/21/2020   Cervical stenosis of spinal canal 04/21/2020   Tinnitus of both ears 07/22/2020   Hypothyroidism 10/24/2020   Trouble in sleeping 10/08/2021   Uncontrolled type 2 diabetes mellitus with hyperglycemia ([place]) 10/08/2021   Injury of great toenail 10/08/2021   Age spots 10/08/2021   Cherry angioma 10/08/2021   Nail avulsion of toe, initial encounter 10/08/2021   Resolved Ambulatory Problems    Diagnosis Date Noted   PULMONARY EMBOLISM 10/12/2006   INFLUENZA DUE TO ID NOVEL H1N1 INFLUENZA VIRUS 03/20/2008   UTI 11/29/2008   Pre-diabetes 01/23/2014   Pyelonephritis 08/19/2016   Past Medical History:  Diagnosis Date   GERD (gastroesophageal reflux disease)    H/O blood clots    OBSTRUCTIVE SLEEP APNEA 09/21/2007       ROS See HPI.    Objective:     BP (!) 142/90 (BP Location: Right Arm, Patient Position: Standing)  Pulse 88  BP Readings from Last 3 Encounters:  10/08/21 (!) 142/90  08/12/21 130/63  03/11/21 (!) 122/108   Wt Readings from Last 3 Encounters:  08/12/21 214 lb (97.1 kg)  03/11/21 214 lb (97.1 kg)  11/19/20 218 lb (98.9 kg)      Physical Exam Vitals reviewed.  Constitutional:      Appearance: Normal appearance. She is obese.  HENT:     Head: Normocephalic.  Cardiovascular:     Rate and Rhythm: Normal rate.     Pulses: Normal pulses.      Heart sounds: Murmur heard.  Pulmonary:     Effort: Pulmonary effort is normal.     Breath sounds: Normal breath sounds.  Musculoskeletal:     Right lower leg: No edema.     Left lower leg: No edema.  Skin:    Comments: Right great toenail attached to the medial edge by minimal amount of toenail.   Neurological:     General: No focal deficit present.     Mental Status: She is alert and oriented to person, place, and time.     Comments: 2+ pedal pulses bilaterally.   Psychiatric:        Mood and Affect: Mood normal.   Soaked great right toe in hibclens Used hematostats to pull nail edge completley free  Placed bactroban over nail bed Wrapped with coban       Assessment & Plan:  Marland KitchenMarland KitchenJenavi was seen today for diabetes.  Diagnoses and all orders for this visit:  Uncontrolled type 2 diabetes mellitus with hyperglycemia (HCC) -     empagliflozin (JARDIANCE) 25 MG TABS tablet; Take 1 tablet (25 mg total) by mouth daily before breakfast. For diabetes. -     metFORMIN (GLUCOPHAGE) 500 MG tablet; Take 1 tablet (500 mg total) by mouth 2 (two) times daily with a meal. For diabetes. -     glipiZIDE (GLUCOTROL) 5 MG tablet; Take 1 tablet (5 mg total) by mouth 2 (two) times daily before a meal. For diabetes. -     AMB Referral to [facility]  Age spots -     Ambulatory referral to Dermatology  Trouble in sleeping -     amitriptyline (ELAVIL) 25 MG tablet; Take one to two tablets at bedtime for sleep.  Injury of great toenail  Cherry angioma -     Ambulatory referral to Dermatology  Unspecified skin changes -     Ambulatory referral to Dermatology  Medication management -     AMB [facility]erral to [facility]  Nail avulsion of toe, initial encounter -     doxycycline (VIBRA-TABS) 100 MG tablet; Take 1 tablet (100 mg total) by mouth 2 (two) times daily.   Discussed medications with patient decided on:  Metformin '500mg'$  bid- '1000mg'$  caused GI  upset Glipizide '5mg'$  bid Jardiance '25mg'$  qd Will make referral to pharmacist A1C in 3 months Fasting labs ordered today  TSH recheck  Discussed sleeping and pain  Start elavil qhs  Pulled nail free and discussed signs of infection Change bandage every 24 hours for 5 days Bactroban given in office Keep covered Good pulses so should heal well If signs of infection start doxy for 10 days  Discussed skin changes and from what I see no worrisome findings mostly age spots and cherry angiomas Dermatology referral made Reassurance given  Spent 40 minutes with patient giving care, explaining medications, discussing plan.    Return in about 3 months (around 01/08/2022),  or if symptoms worsen or fail to improve, for A1C.    Iran Planas, PA-C

## 2021-10-08 NOTE — Progress Notes (Signed)
Stopped Ozempic and Xigduo 3 months ago - too expensive BS running [phone] Too early for a1c - 08/22/2021 - 7.3 Dizziness - can not do orthostatics, can not lay flat  Sitting - 150/59 Standing - 142/90  Wants referral to dermatology - skin lesions

## 2021-10-08 NOTE — Patient Instructions (Addendum)
Amitriptyline for sleep to take at bedtime  Metformin twice a day Glipizide twice a day Jardanice once a day   All for blood sugar  If signs of foot infection start doxycycline Use topical antibiotic and keep wound clean

## 2021-10-09 ENCOUNTER — Telehealth: Payer: Self-pay | Admitting: *Deleted

## 2021-10-09 ENCOUNTER — Other Ambulatory Visit: Payer: Self-pay | Admitting: Physician Assistant

## 2021-10-09 DIAGNOSIS — E039 Hypothyroidism, unspecified: Secondary | ICD-10-CM

## 2021-10-09 LAB — COMPLETE METABOLIC PANEL WITH GFR
AG Ratio: 1.4 (calc) (ref 1.0–2.5)
ALT: 36 U/L — ABNORMAL HIGH (ref 6–29)
AST: 38 U/L — ABNORMAL HIGH (ref 10–35)
Albumin: 4.2 g/dL (ref 3.6–5.1)
Alkaline phosphatase (APISO): 193 U/L — ABNORMAL HIGH (ref 37–153)
BUN: 18 mg/dL (ref 7–25)
CO2: 21 mmol/L (ref 20–32)
Calcium: 9.5 mg/dL (ref 8.6–10.4)
Chloride: 101 mmol/L (ref 98–110)
Creat: 0.66 mg/dL (ref 0.60–1.00)
Globulin: 3.1 g/dL (calc) (ref 1.9–3.7)
Glucose, Bld: 248 mg/dL — ABNORMAL HIGH (ref 65–99)
Potassium: 4.7 mmol/L (ref 3.5–5.3)
Sodium: 135 mmol/L (ref 135–146)
Total Bilirubin: 0.5 mg/dL (ref 0.2–1.2)
Total Protein: 7.3 g/dL (ref 6.1–8.1)
eGFR: 93 mL/min/{1.73_m2} (ref >=60)

## 2021-10-09 LAB — LIPID PANEL W/REFLEX DIRECT LDL
Cholesterol: 214 mg/dL — ABNORMAL HIGH (ref <200)
HDL: 43 mg/dL — ABNORMAL LOW (ref >=50)
LDL Cholesterol (Calc): 134 mg/dL (calc) — ABNORMAL HIGH
Non-HDL Cholesterol (Calc): 171 mg/dL (calc) — ABNORMAL HIGH (ref <130)
Total CHOL/HDL Ratio: 5 (calc) — ABNORMAL HIGH (ref <5.0)
Triglycerides: 229 mg/dL — ABNORMAL HIGH (ref <150)

## 2021-10-09 LAB — TSH: TSH: 3.14 mIU/L (ref 0.40–4.50)

## 2021-10-09 MED ORDER — ROSUVASTATIN CALCIUM 10 MG PO TABS: 10.0000 mg | ORAL_TABLET | Freq: Every day | ORAL | 1 refills | Status: DC

## 2021-10-09 MED ORDER — LEVOTHYROXINE SODIUM 25 MCG PO TABS: ORAL_TABLET | ORAL | 3 refills | Status: DC

## 2021-10-09 NOTE — Progress Notes (Signed)
Thyroid looks MUCH better. Sent levothyroxine to pharmacy to continue.   Random glucose is high but we address those medication yesterday.   Liver enzymes have improved some.   Cholesterol is some better but not to goal. We need to increase crestor to '10mg'$  double up on what you have and I can send over next dose.

## 2021-10-09 NOTE — Chronic Care Management (AMB) (Signed)
  Care Management   Outreach Note  10/09/2021 Name: Nicole Giles MRN: [id number] DOB: 07-24-1945  Referred by: Donella Stade, PA-C Reason for referral : Care Coordination (Outreach to schedule referral with Pharm D )   An unsuccessful telephone outreach was attempted today. The patient was referred to the case management team for assistance with care management and care coordination.   Follow Up Plan:  A HIPAA compliant phone message was left for the patient providing contact information and requesting a return call.   Julian Hy, [facility]  Direct Dial: [phone]

## 2021-10-22 NOTE — Chronic Care Management (AMB) (Signed)
  Care Management   Note  10/22/2021 Name: [name] MRN: [id number] DOB: 05-19-1945  Nicole Giles is a 76 y.o. year old female who is a primary care patient of Donella Stade, [place]. I reached out to [name] by phone today offer care coordination services.   Nicole Giles was given information about care management services today including:  Care management services include personalized support from designated clinical staff supervised by her physician, including individualized plan of care and coordination with other care providers 24/7 contact phone numbers for assistance for urgent and routine care needs. The patient may stop care management services at any time by phone call to the office staff.  Patient agreed to services and verbal consent obtained.   Follow up plan: Telephone appointment with care management team member scheduled for: 10/28/2021  Julian Hy, [facility]  Direct Dial: [phone]

## 2021-10-28 ENCOUNTER — Ambulatory Visit: Payer: Medicare PPO

## 2021-10-28 ENCOUNTER — Telehealth: Payer: Medicare PPO

## 2021-10-28 DIAGNOSIS — E1165 Type 2 diabetes mellitus with hyperglycemia: Secondary | ICD-10-CM

## 2021-10-28 NOTE — Patient Instructions (Addendum)
Visit Information  Thank you for taking time to visit with me today. Please don't hesitate to contact me if I can be of assistance to you before our next scheduled telephone appointment.  Following are the goals we discussed today:  Patient Goals/Self-Care Activities: Take medications as prescribed   Attend all scheduled provider appointments Call pharmacy for medication refills 3-7 days in advance of running out of medications Call provider office for new concerns or questions  Check and record blood sugar as recommended by provider. Notify provider if consistently outside of recommended range Review educational material regarding diabetes and hyperlipidemia and plan to discuss at next telephone call  Our next appointment is by telephone on 12/02/21 at 1:15 pm  Please call the care guide team at [phone] if you need to cancel or reschedule your appointment.   If you are experiencing a Mental Health or [place] or need someone to talk to, please call the Suicide and Crisis Lifeline: 988 call 1-800-273-TALK (toll free, 24 hour hotline)   Following is a copy of your full plan of care:  Care Plan : Nicole of Care  Updates made by Luretha Rued, RN since 10/28/2021 12:00 AM     Problem: Disease Managment Education and/or Care Coordination needs   Priority: High     Long-Range Goal: Development of plan of care for Disease Management and/or care coordination needs   Start Date: 10/28/2021  Expected End Date: 01/28/2022  Priority: High  Note:   Current Barriers: Referral for care coordination. Patient reports she would like to work on diabetes education. She reports has been prescribed Jardiance, but has not started taken because several medications prescribed at the same time and she wants to begin one at a time to be able to determine if any side effects. She also reports cost of Jardiance $312/month. Nicole Giles reports she has not taken rosuvastatin in a  couple of days because it is making Nicole Giles bones ache. She states she was prescribed an antibiotic by PCP last office visit (on 10/08/21), but did not take because foot did not show any signs of infection. Increased Lipid levels noted. She is also interested in referral to dietician/nutritionist. She reports she is getting ready to start outpatient rehab therapy on 11/05/21. She reports blood sugar this morning 158 and reports blood sugar ranges 94-200.  Knowledge Deficits related to plan of care for management of HLD and DMII  Care Coordination needs related to Financial constraints related to Utilities, Medication procurement, and Lacks knowledge of community resource: possible assistance for Utilities: Adult nurse in [place], forensic in summer.  Non-adherence to prescribed medication regimen Difficulty obtaining medications No Advanced Directives in place- request Advance directive packet  RNCM Clinical Goal(s):  Patient will verbalize understanding of plan for management of DMII as evidenced by self report and or notation in chart work with pharmacist to address Medication procurement and medication adherence related to DMII as evidenced by review of EMR and patient or pharmacist report    work with community resource care guide to address needs related to Financial constraints related to resources for assistance with heat in winter and electric in summer as evidenced by patient and/or community resource care guide support    through collaboration with Consulting civil engineer, provider, and care team.   Interventions: Collaboration with primary care provider regarding care management services as needed Inter-disciplinary care team collaboration (see longitudinal plan of care) Evaluation of current treatment plan related to  self management and patient's adherence to plan as established by provider   Diabetes Interventions:  (Status:  New goal.) Long Term Goal Assessed patient's understanding of A1c goal:  <7% Provided education to patient about basic DM disease process Reviewed medications with patient and discussed importance of medication adherence Counseled on importance of regular laboratory monitoring as prescribed Reviewed scheduled/upcoming provider appointments including: clinical pharmacist, outpatient rehab, neurosurgery and PCP Referral made to pharmacy team for assistance with medication procurement, medication adherence Referral made to community resources care guide team for assistance with community resources for possible assistance with utilities: Gas in winter and IT trainer in [place] PCP updated on patient request for referral for nutrition/dietician Lab Results  Component Value Date   HGBA1C 7.3 (A) 08/12/2021  Patient Goals/Self-Care Activities: Take medications as prescribed   Attend all scheduled provider appointments Call pharmacy for medication refills 3-7 days in advance of running out of medications Call provider office for new concerns or questions  Check and record blood sugar as recommended by provider. Notify provider if consistently outside of recommended range Review educational material regarding diabetes and hyperlipidemia and plan to discuss at next telephone call       Nicole Giles was given information about Care Management services by the embedded care coordination team including:  Care Management services include personalized support from designated clinical staff supervised by Nicole Giles physician, including individualized plan of care and coordination with other care providers 24/7 contact phone numbers for assistance for urgent and routine care needs. The patient may stop CCM services at any time (effective at the end of the month) by phone call to the office staff.  Patient agreed to services and verbal consent obtained.   Patient verbalizes understanding of instructions and care plan provided today and agrees to view in [place]. Active MyChart status and  patient understanding of how to access instructions and care plan via MyChart confirmed with patient.      Thea Silversmith, RN, MSN, BSN, CCM Care Management Coordinator [facility] [phone]    Diabetes Mellitus Basics  Diabetes mellitus, or diabetes, is a long-term (chronic) disease. It occurs when the body does not properly use sugar (glucose) that is released from food after you eat. Diabetes mellitus may be caused by one or both of these problems: Your pancreas does not make enough of a hormone called insulin. Your body does not react in a normal way to the insulin that it makes. Insulin lets glucose enter cells in your body. This gives you energy. If you have diabetes, glucose cannot get into cells. This causes high blood glucose (hyperglycemia). How to treat and manage diabetes You may need to take insulin or other diabetes medicines daily to keep your glucose in balance. If you are prescribed insulin, you will learn how to give yourself insulin by injection. You may need to adjust the amount of insulin you take based on the foods that you eat. You will need to check your blood glucose levels using a glucose monitor as told by your health care provider. The readings can help determine if you have low or high blood glucose. Generally, you should have these blood glucose levels: Before meals (preprandial): 80-130 mg/dL (4.4-7.2 mmol/L). After meals (postprandial): below 180 mg/dL (10 mmol/L). Hemoglobin A1c (HbA1c) level: less than 7%. Your health care provider will set treatment goals for you. Keep all follow-up visits. This is important. Follow these instructions at home: Diabetes medicines Take your diabetes medicines every day as told by your health  care provider. List your diabetes medicines here: Name of medicine: ______________________________ Amount (dose): _______________ Time (a.m./p.m.): _______________ Notes: ___________________________________ Name of  medicine: ______________________________ Amount (dose): _______________ Time (a.m./p.m.): _______________ Notes: ___________________________________ Name of medicine: ______________________________ Amount (dose): _______________ Time (a.m./p.m.): _______________ Notes: ___________________________________ Insulin If you use insulin, list the types of insulin you use here: Insulin type: ______________________________ Amount (dose): _______________ Time (a.m./p.m.): _______________Notes: ___________________________________ Insulin type: ______________________________ Amount (dose): _______________ Time (a.m./p.m.): _______________ Notes: ___________________________________ Insulin type: ______________________________ Amount (dose): _______________ Time (a.m./p.m.): _______________ Notes: ___________________________________ Insulin type: ______________________________ Amount (dose): _______________ Time (a.m./p.m.): _______________ Notes: ___________________________________ Insulin type: ______________________________ Amount (dose): _______________ Time (a.m./p.m.): _______________ Notes: ___________________________________ Managing blood glucose  Check your blood glucose levels using a glucose monitor as told by your health care provider. Write down the times that you check your glucose levels here: Time: _______________ Notes: ___________________________________ Time: _______________ Notes: ___________________________________ Time: _______________ Notes: ___________________________________ Time: _______________ Notes: ___________________________________ Time: _______________ Notes: ___________________________________ Time: _______________ Notes: ___________________________________  Low blood glucose Low blood glucose (hypoglycemia) is when glucose is at or below 70 mg/dL (3.9 mmol/L). Symptoms may include: Feeling: Hungry. Sweaty and clammy. Irritable or easily  upset. Dizzy. Sleepy. Having: A fast heartbeat. A headache. A change in your vision. Numbness around the mouth, lips, or tongue. Having trouble with: Moving (coordination). Sleeping. Treating low blood glucose To treat low blood glucose, eat or drink something containing sugar right away. If you can think clearly and swallow safely, follow the 15:15 rule: Take 15 grams of a fast-acting carb (carbohydrate), as told by your health care provider. Some fast-acting carbs are: Glucose tablets: take 3-4 tablets. Hard candy: eat 3-5 pieces. Fruit juice: drink 4 oz (120 mL). Regular (not diet) soda: drink 4-6 oz (120-180 mL). Honey or sugar: eat 1 Tbsp (15 mL). Check your blood glucose levels 15 minutes after you take the carb. If your glucose is still at or below 70 mg/dL (3.9 mmol/L), take 15 grams of a carb again. If your glucose does not go above 70 mg/dL (3.9 mmol/L) after 3 tries, get help right away. After your glucose goes back to normal, eat a meal or a snack within 1 hour. Treating very low blood glucose If your glucose is at or below 54 mg/dL (3 mmol/L), you have very low blood glucose (severe hypoglycemia). This is an emergency. Do not wait to see if the symptoms will go away. Get medical help right away. Call your local emergency services (911 in the [place] Do not drive yourself to the hospital. Questions to ask your health care provider Should I talk with a diabetes educator? What equipment will I need to care for myself at home? What diabetes medicines do I need? When should I take them? How often do I need to check my blood glucose levels? What number can I call if I have questions? When is my follow-up visit? Where can I find a support group for people with diabetes? Where to find more information American Diabetes Association: www.diabetes.org Association of Diabetes Care and Education Specialists: www.diabeteseducator.org Contact a health care provider if: Your blood  glucose is at or above 240 mg/dL (13.3 mmol/L) for 2 days in a row. You have been sick or have had a fever for 2 days or more, and you are not getting better. You have any of these problems for more than 6 hours: You cannot eat or drink. You feel nauseous. You vomit. You have diarrhea. Get help right away if: Your blood glucose is lower than 54  mg/dL (3 mmol/L). You get confused. You have trouble thinking clearly. You have trouble breathing. These symptoms may represent a serious problem that is an emergency. Do not wait to see if the symptoms will go away. Get medical help right away. Call your local emergency services (911 in the [place] Do not drive yourself to the hospital. Summary Diabetes mellitus is a chronic disease that occurs when the body does not properly use sugar (glucose) that is released from food after you eat. Take insulin and diabetes medicines as told. Check your blood glucose every day, as often as told. Keep all follow-up visits. This is important. This information is not intended to replace advice given to you by your health care provider. Make sure you discuss any questions you have with your health care provider. Document Revised: 08/28/2019 Document Reviewed: 08/28/2019 Elsevier Patient Education  [place].

## 2021-10-28 NOTE — Chronic Care Management (AMB) (Signed)
Per [facility] pt needs pharmacy call - scheduled with PharmD for 10/29/2021

## 2021-10-28 NOTE — Chronic Care Management (AMB) (Signed)
Care Management    RN Visit Note  10/28/2021 Name: Nicole Giles MRN: [id number] DOB: 02-14-1946  Subjective: Nicole Giles is a 76 y.o. year old female who is a primary care patient of Donella Stade, PA-C. The care management team was consulted for assistance with disease management and care coordination needs.    Engaged with patient by telephone for initial visit in response to provider referral for case management and/or care coordination services.   Consent to Services:   Ms. Nilsen was given information about Care Management services today including:  Care Management services includes personalized support from designated clinical staff supervised by her physician, including individualized plan of care and coordination with other care providers 24/7 contact phone numbers for assistance for urgent and routine care needs. The patient may stop case management services at any time by phone call to the office staff.  Patient agreed to services and consent obtained.   Assessment: Review of patient past medical history, allergies, medications, health status, including review of consultants reports, laboratory and other test data, was performed as part of comprehensive evaluation and provision of chronic care management services.   SDOH (Social Determinants of Health) assessments and interventions performed:  SDOH Interventions    Flowsheet Row Most Recent Value  SDOH Interventions   Food Insecurity Interventions Intervention Not Indicated  Financial Strain Interventions Other (Comment)  [referral to clinical pharmacist to assist with medication cost,  referral to care guide to provide resources for cost of gas heat in winter and electric in summer]  Transportation Interventions Intervention Not Indicated        Care Plan  Allergies  Allergen Reactions   Codeine    Lipitor [Atorvastatin Calcium]     myaglia   Meperidine Hcl     Outpatient Encounter Medications as of  10/28/2021  Medication Sig Note   amitriptyline (ELAVIL) 25 MG tablet Take one to two tablets at bedtime for sleep.    Calcium Citrate-Vitamin D 250-2.5 MG-MCG TABS Take by mouth.    diclofenac Sodium (VOLTAREN) 1 % GEL Apply 4 g topically 4 (four) times daily. To affected joint. 10/28/2021: Reports uses as needed   esomeprazole (NEXIUM) 40 MG capsule Take 1 capsule (40 mg total) by mouth daily at 12 noon.    glipiZIDE (GLUCOTROL) 5 MG tablet Take 1 tablet (5 mg total) by mouth 2 (two) times daily before a meal. For diabetes.    hydrochlorothiazide (HYDRODIURIL) 12.5 MG tablet Take 1 tablet (12.5 mg total) by mouth daily. As needed for swelling.    levothyroxine (SYNTHROID) 25 MCG tablet TAKE 1 TABLET BY MOUTH ONCE DAILY    metFORMIN (GLUCOPHAGE) 500 MG tablet Take 1 tablet (500 mg total) by mouth 2 (two) times daily with a meal. For diabetes.    Multiple Vitamin (MULTIVITAMIN) capsule Take 1 capsule by mouth daily.    tiZANidine (ZANAFLEX) 4 MG tablet Take 4 mg by mouth every 8 (eight) hours as needed.    doxycycline (VIBRA-TABS) 100 MG tablet Take 1 tablet (100 mg total) by mouth 2 (two) times daily. (Patient not taking: Reported on 10/28/2021)    empagliflozin (JARDIANCE) 25 MG TABS tablet Take 1 tablet (25 mg total) by mouth daily before breakfast. For diabetes. (Patient not taking: Reported on 10/28/2021)    Lancets 33G MISC Dx DM E11.65. Check fasting blood sugar every morning.    rosuvastatin (CRESTOR) 10 MG tablet Take 1 tablet (10 mg total) by mouth daily. (Patient not taking: Reported on  10/28/2021)    No facility-administered encounter medications on file as of 10/28/2021.    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   Nausea 04/02/2019   Controlled type 2 diabetes mellitus with hyperglycemia, without long-term current use of insulin ([place]) 12/15/2018   Baker's cyst of knee, right 11/22/2018   History of pulmonary embolus (PE) 07/13/2018   Fatty liver 06/16/2018   Fibroadenoma of right breast 05/12/2018   Diverticulosis 05/12/2018   Internal hemorrhoids 05/12/2018   Colon polyp 05/12/2018   Post-traumatic osteoarthritis of right knee 11/30/2017   Pain of left calf 01/16/2017   Weakness 09/19/2016   Elevated C-reactive protein (CRP) 09/19/2016   Acute pain of left shoulder 09/19/2016   Arthritis of shoulder region, left, degenerative 09/17/2016   No energy 08/19/2016   Myalgia 08/19/2016   Cough 08/19/2016   Polymyalgia ([place]) 08/11/2016   Peripheral edema 12/31/2015   Elevated blood pressure 12/31/2015   Primary osteoarthritis of right knee 08/15/2014   Elevated fasting glucose 01/16/2014   Hyperlipidemia LDL goal <70 12/17/2013   Arthralgia 12/17/2013   Body aches 12/17/2013   INJURY OTHER AND UNSPECIFIED FINGER 10/04/2008   FREQUENCY, URINARY 09/09/2008   FATTY LIVER DISEASE 09/04/2008   UNSPECIFIED DISORDER OF KIDNEY AND URETER 09/02/2008   TRANSAMINASES, SERUM, ELEVATED 08/27/2008   GERD 08/22/2008   DIZZINESS 08/22/2008   Dyspnea 08/22/2008   CERVICALGIA 06/13/2008   Pain in limb 11/13/2007   OSA on CPAP 09/21/2007   HYPERLIPIDEMIA 08/28/2007   DEPENDENT EDEMA, LEGS 08/25/2007   CHEST PAIN 06/30/2007   ARTHRITIS, KNEES, BILATERAL 06/01/2007   VERTIGO, BENIGN PAROXYSMAL POSITION 02/15/2007   FATIGUE 12/08/2006   Morbid obesity ([place]) 11/17/2006   HEADACHE, TENSION 10/12/2006    Conditions to be addressed/monitored: DMII  Care Plan : RN Care Manager Plan of Care   Updates made by Luretha Rued, RN since 10/28/2021 12:00 AM     Problem: Disease Managment Education and/or Care Coordination needs   Priority: High     Long-Range Goal: Development of plan of care for Disease Management and/or care coordination needs   Start Date: 10/28/2021  Expected End Date: 01/28/2022  Priority: High  Note:   Current Barriers: Referral for care coordination. Patient reports she would like to work on diabetes education. She reports has been prescribed Jardiance, but has not started taken because several medications prescribed at the same time and she wants to begin one at a time to be able to determine if any side effects. She also reports cost of Jardiance $312/month. Mrs. Pirozzi reports she has not taken rosuvastatin in a couple of days because it is making her bones ache. She states she was prescribed an antibiotic by PCP last office visit (on 10/08/21), but did not take because foot did not show any signs of infection. Increased Lipid levels noted. She is also interested in referral to dietician/nutritionist. She reports she is getting ready to start outpatient rehab therapy on 11/05/21. She reports blood sugar this morning 158 and reports blood sugar ranges 94-200.  Knowledge Deficits related to plan of care for management of HLD and DMII  Care Coordination needs related to Financial constraints related to Utilities, Medication procurement, and Lacks knowledge of community resource: possible assistance for Utilities: Adult nurse in [place], forensic in summer.  Non-adherence to prescribed medication regimen Difficulty obtaining medications No Advanced Directives in place- request Advance directive packet  RNCM Clinical Goal(s):  Patient will verbalize understanding of plan for management of DMII as evidenced by self report and or notation in chart work with pharmacist to address Medication procurement and medication adherence related to DMII as evidenced by review of EMR and  patient or pharmacist report    work with community resource care guide to address needs related to Financial constraints related to resources for assistance with heat in winter and electric in summer as evidenced by patient and/or community resource care guide support    through collaboration with Consulting civil engineer, provider, and care team.   Interventions: Collaboration with primary care provider regarding care management services as needed Inter-disciplinary care team collaboration (see longitudinal plan of care) Evaluation of current treatment plan related to  self management and patient's adherence to plan as established by provider   Diabetes Interventions:  (Status:  New goal.) Long Term Goal Assessed patient's understanding of A1c goal: <7% Provided education to patient about basic DM disease process Reviewed medications with patient and discussed importance of medication adherence Counseled on importance of regular laboratory monitoring as prescribed Reviewed scheduled/upcoming provider appointments including: clinical pharmacist, outpatient rehab, neurosurgery and PCP Referral made to pharmacy team for assistance with medication procurement, medication adherence Referral made to community resources care guide team for assistance with community resources for possible assistance with utilities: Gas in winter and IT trainer in [place] PCP updated on patient request for referral for nutrition/dietician Lab Results  Component Value Date   HGBA1C 7.3 (A) 08/12/2021  Patient Goals/Self-Care Activities: Take medications as prescribed   Attend all scheduled provider appointments Call pharmacy for medication refills 3-7 days in advance of running out of medications Call provider office for new concerns or questions  Check and record blood sugar as recommended by provider. Notify provider if consistently outside of recommended range Review educational material regarding diabetes and  hyperlipidemia and plan to discuss at next telephone call       Plan: Telephone follow up appointment with care management team member scheduled for:  12/02/21 The patient has been provided with contact information for the care management team and has been advised to call with any health related questions or concerns.   Thea Silversmith, RN, MSN, BSN, CCM Care Management Coordinator [facility] [phone]

## 2021-10-29 ENCOUNTER — Telehealth: Payer: Self-pay

## 2021-10-29 ENCOUNTER — Telehealth: Payer: Self-pay | Admitting: Pharmacist

## 2021-10-29 ENCOUNTER — Telehealth: Payer: Medicare PPO

## 2021-10-29 NOTE — Telephone Encounter (Signed)
   Telephone encounter was:  Successful.  10/29/2021 Name: Nicole Giles MRN: [id number] DOB: 1945-08-02  Nicole Giles is a 76 y.o. year old female who is a primary care patient of Donella Stade, [place] . The community resource team was consulted for assistance with  financial assistance  Care guide performed the following interventions: Patient provided with information about care guide support team and interviewed to confirm resource needs.Patient stated she needs help with her utilities. Her power and gas bills as to high. I have mailed resources to the patient and will follow up in two weeks  Follow Up Plan:  Care guide will follow up with patient by phone over the next two weeks    [place] [facility]  [phone] [address], [place], [place] [address] Phone: [phone] Email: [email or web address]

## 2021-10-29 NOTE — Telephone Encounter (Signed)
Unsuccessful attempt x2 to contact patient for initial phone call for CCM services with pharmacist. Left voicemail.  Will route to schedule team for reschedule.   Elaysia Devargas, PharmD Clinical Pharmacist [facility] [phone]  

## 2021-11-02 ENCOUNTER — Telehealth: Payer: Self-pay | Admitting: Family Medicine

## 2021-11-02 ENCOUNTER — Telehealth: Payer: Self-pay | Admitting: Pharmacist

## 2021-11-02 DIAGNOSIS — E1165 Type 2 diabetes mellitus with hyperglycemia: Secondary | ICD-10-CM

## 2021-11-02 NOTE — Progress Notes (Signed)
    Chronic Care Management Pharmacy Assistant   Name: IYONA NANNI  MRN: [id number] DOB: [birth year]  Nicole Giles is an 76 y.o. year old female who presents for his initial CCM visit with the clinical pharmacist.   Recent office visits:  10/08/21 Jomarie Longs, PA-C PCP (Type 2 diabetes mellitus) Orders: AMB Referral to [facility], Ambulatory referral to Dermatology; Medication changes: amitriptyline (ELAVIL) 25 MG tablet, doxycycline (VIBRA-TABS) 100 MG tablet, empagliflozin (JARDIANCE) 25 MG TABS tablet, glipiZIDE (GLUCOTROL) 5 MG tablet, metFORMIN (GLUCOPHAGE) 500 MG tablet  Recent consult visits:  None noted  Hospital visits:  None in previous 6 months  Medications: Outpatient Encounter Medications as of 11/02/2021  Medication Sig Note   amitriptyline (ELAVIL) 25 MG tablet Take one to two tablets at bedtime for sleep.    Calcium Citrate-Vitamin D 250-2.5 MG-MCG TABS Take by mouth.    diclofenac Sodium (VOLTAREN) 1 % GEL Apply 4 g topically 4 (four) times daily. To affected joint. 10/28/2021: Reports uses as needed   doxycycline (VIBRA-TABS) 100 MG tablet Take 1 tablet (100 mg total) by mouth 2 (two) times daily. (Patient not taking: Reported on 10/28/2021)    empagliflozin (JARDIANCE) 25 MG TABS tablet Take 1 tablet (25 mg total) by mouth daily before breakfast. For diabetes. (Patient not taking: Reported on 10/28/2021)    esomeprazole (NEXIUM) 40 MG capsule Take 1 capsule (40 mg total) by mouth daily at 12 noon.    glipiZIDE (GLUCOTROL) 5 MG tablet Take 1 tablet (5 mg total) by mouth 2 (two) times daily before a meal. For diabetes.    hydrochlorothiazide (HYDRODIURIL) 12.5 MG tablet Take 1 tablet (12.5 mg total) by mouth daily. As needed for swelling.    Lancets 33G MISC Dx DM E11.65. Check fasting blood sugar every morning.    levothyroxine (SYNTHROID) 25 MCG tablet TAKE 1 TABLET BY MOUTH ONCE DAILY    metFORMIN (GLUCOPHAGE) 500 MG tablet Take 1 tablet (500 mg  total) by mouth 2 (two) times daily with a meal. For diabetes.    Multiple Vitamin (MULTIVITAMIN) capsule Take 1 capsule by mouth daily.    rosuvastatin (CRESTOR) 10 MG tablet Take 1 tablet (10 mg total) by mouth daily. (Patient not taking: Reported on 10/28/2021)    tiZANidine (ZANAFLEX) 4 MG tablet Take 4 mg by mouth every 8 (eight) hours as needed.    No facility-administered encounter medications on file as of 11/02/2021.   Medication List:   amitriptyline (ELAVIL) 25 MG tablet-10/08/21 3 ds Calcium Citrate-Vitamin D 250-2.5 MG-MCG TABS diclofenac Sodium (VOLTAREN) 1 % GEL doxycycline (VIBRA-TABS) 100 MG tablet empagliflozin (JARDIANCE) 25 MG TABS tablet-10/08/21 30 ds esomeprazole (NEXIUM) 40 MG capsule-08/12/21 90 ds glipiZIDE (GLUCOTROL) 5 MG tablet-10/08/21 30 ds hydrochlorothiazide (HYDRODIURIL) 12.5 MG tablet-09/09/21 30 ds Lancets 33G MISC levothyroxine (SYNTHROID) 25 MCG tablet-08/12/21 90 ds metFORMIN (GLUCOPHAGE) 500 MG tablet-10/08/21 30 ds Multiple Vitamin (MULTIVITAMIN) capsule rosuvastatin (CRESTOR) 10 MG tablet-10/09/21 90 ds tiZANidine (ZANAFLEX) 4 MG tablet-08/31/21 10 ds    Care Gaps: Colonoscopy-05/05/18 Diabetic Foot Exam-08/12/21 Mammogram-05/01/18 Ophthalmology-05/22/21 Dexa Scan - NA Annual Well Visit -  Micro albumin-08/12/21 Hemoglobin A1c- 4/5/2  Star Rating Drugs: empagliflozin (JARDIANCE) 25 MG TABS tablet-10/08/21 30 ds glipiZIDE (GLUCOTROL) 5 MG tablet-10/08/21 30 ds merosuvastatin (CRESTOR) 10 MG tablet-10/09/21 90 dstFORMIN (GLUCOPHAGE) 500 MG tablet-10/08/21 30 ds  Velvet Bathe Clinical Pharmacist Assistant [phone]

## 2021-11-05 DIAGNOSIS — M542 Cervicalgia: Secondary | ICD-10-CM | POA: Diagnosis not present

## 2021-11-05 DIAGNOSIS — M546 Pain in thoracic spine: Secondary | ICD-10-CM | POA: Diagnosis not present

## 2021-11-05 DIAGNOSIS — M545 Low back pain, unspecified: Secondary | ICD-10-CM | POA: Diagnosis not present

## 2021-11-06 ENCOUNTER — Telehealth: Payer: Self-pay | Admitting: *Deleted

## 2021-11-06 ENCOUNTER — Telehealth: Payer: Medicare PPO

## 2021-11-06 NOTE — Chronic Care Management (AMB) (Signed)
  Care Coordination Note  11/06/2021 Name: Nicole Giles MRN: [id number] DOB: 11-17-1945  Nicole Giles is a 76 y.o. year old female who is a primary care patient of Lavada Mesi and is actively engaged with the care management team. I reached out to [name] by phone today to assist with re-scheduling an initial visit with the Pharmacist  Follow up plan: Unsuccessful telephone outreach attempt made. A HIPAA compliant phone message was left for the patient providing contact information and requesting a return call.  The care management team will reach out to the patient again over the next 7 days.  The patient has been provided with contact information for the care management team and has been advised to call with any health related questions or concerns.  If patient returns call to provider office, please advise to call [place] at [phone].  [place] Management  Direct Dial: [phone]

## 2021-11-06 NOTE — Chronic Care Management (AMB) (Signed)
  Care Coordination Note  11/06/2021 Name: Nicole Giles MRN: [id number] DOB: [birth year]  Nicole Giles is a 76 y.o. year old female who is a primary care patient of Lavada Mesi and is actively engaged with the care management team. I reached out to [name] by phone today to assist with re-scheduling an initial visit with the Pharmacist  Follow up plan: Unsuccessful telephone outreach attempt made. A HIPAA compliant phone message was left for the patient providing contact information and requesting a return call.   Julian Hy, [place] Management  Direct Dial: [phone]

## 2021-11-13 ENCOUNTER — Telehealth: Payer: Self-pay

## 2021-11-13 NOTE — Telephone Encounter (Signed)
   Telephone encounter was:  Unsuccessful.  11/13/2021 Name: Nicole Giles MRN: [id number] DOB: October 30, 1945  Unsuccessful outbound call made today to assist with:  Financial Difficulties related to financial strain  Follow up call. Left a message to call back at earliest convenience.   [place], Care Management  [phone] [address], [place], [place] [address] Phone: [phone] Email: [email or web address]

## 2021-11-16 ENCOUNTER — Telehealth: Payer: Self-pay

## 2021-11-16 NOTE — Telephone Encounter (Signed)
   Telephone encounter was:  Unsuccessful.  11/16/2021 Name: Nicole Giles MRN: [id number] DOB: Aug 09, 1945  Unsuccessful outbound call made today to assist with:  Financial Difficulties related to financial strain  Outreach Attempt:  2nd Attempt  A HIPAA compliant voice message was left requesting a return call.  Instructed patient to call back at earliest convenience.     [place], Care Management  [phone] [address], [place], [place] [address] Phone: [phone] Email: [email or web address]

## 2021-11-18 ENCOUNTER — Telehealth: Payer: Self-pay

## 2021-11-18 DIAGNOSIS — R2689 Other abnormalities of gait and mobility: Secondary | ICD-10-CM | POA: Diagnosis not present

## 2021-11-18 DIAGNOSIS — M546 Pain in thoracic spine: Secondary | ICD-10-CM | POA: Diagnosis not present

## 2021-11-18 DIAGNOSIS — M545 Low back pain, unspecified: Secondary | ICD-10-CM | POA: Diagnosis not present

## 2021-11-18 DIAGNOSIS — M6281 Muscle weakness (generalized): Secondary | ICD-10-CM | POA: Diagnosis not present

## 2021-11-18 DIAGNOSIS — R29898 Other symptoms and signs involving the musculoskeletal system: Secondary | ICD-10-CM | POA: Diagnosis not present

## 2021-11-18 DIAGNOSIS — M542 Cervicalgia: Secondary | ICD-10-CM | POA: Diagnosis not present

## 2021-11-18 NOTE — Chronic Care Management (AMB) (Signed)
  Care Coordination Note  11/18/2021 Name: Nicole Giles MRN: [id number] DOB: 1945/11/11  Nicole Giles is a 76 y.o. year old female who is a primary care patient of Lavada Mesi and is actively engaged with the care management team. I reached out to [name] by phone today to assist with re-scheduling an initial visit with the Pharmacist  Follow up plan: We have been unable to make contact with the patient for follow up. The care management team is available to follow up with the patient after provider conversation with the patient regarding recommendation for care management engagement and subsequent re-referral to the care management team.   Julian Hy, [place] Direct Dial: [phone]

## 2021-11-18 NOTE — Telephone Encounter (Signed)
   Telephone encounter was:  Successful.  11/18/2021 Name: Nicole Giles MRN: [id number] DOB: [birth year]  Nicole Giles is a 76 y.o. year old female who is a primary care patient of Donella Stade, [place] . The community resource team was consulted for assistance with Financial Difficulties related to Financial Strain  Care guide performed the following interventions: Patient provided with information about care guide support team and interviewed to confirm resource needs.  Follow Up Plan:  No further follow up planned at this time. The patient has been provided with needed resources.    [place], Care Management  [phone] [address], [place], [place] [address] Phone: [phone] Email: [email or web address]

## 2021-11-25 DIAGNOSIS — M6281 Muscle weakness (generalized): Secondary | ICD-10-CM | POA: Diagnosis not present

## 2021-11-25 DIAGNOSIS — M546 Pain in thoracic spine: Secondary | ICD-10-CM | POA: Diagnosis not present

## 2021-11-25 DIAGNOSIS — R2689 Other abnormalities of gait and mobility: Secondary | ICD-10-CM | POA: Diagnosis not present

## 2021-11-25 DIAGNOSIS — M545 Low back pain, unspecified: Secondary | ICD-10-CM | POA: Diagnosis not present

## 2021-11-25 DIAGNOSIS — M542 Cervicalgia: Secondary | ICD-10-CM | POA: Diagnosis not present

## 2021-11-25 DIAGNOSIS — R29898 Other symptoms and signs involving the musculoskeletal system: Secondary | ICD-10-CM | POA: Diagnosis not present

## 2021-12-01 ENCOUNTER — Ambulatory Visit (INDEPENDENT_AMBULATORY_CARE_PROVIDER_SITE_OTHER): Payer: Medicare PPO | Admitting: Family Medicine

## 2021-12-01 ENCOUNTER — Encounter: Payer: Self-pay | Admitting: Family Medicine

## 2021-12-01 VITALS — BP 107/55 | HR 83 | Ht 59.0 in | Wt 215.0 lb

## 2021-12-01 DIAGNOSIS — R531 Weakness: Secondary | ICD-10-CM

## 2021-12-01 DIAGNOSIS — R42 Dizziness and giddiness: Secondary | ICD-10-CM

## 2021-12-01 LAB — POCT INFLUENZA A/B
Influenza A, POC: NEGATIVE
Influenza B, POC: NEGATIVE

## 2021-12-01 MED ORDER — MECLIZINE HCL 25 MG PO TABS: 25.0000 mg | ORAL_TABLET | Freq: Three times a day (TID) | ORAL | 0 refills | Status: DC | PRN

## 2021-12-01 NOTE — Assessment & Plan Note (Addendum)
-   Pt has hx of vertigo. Will order meclizine - pt has been able to tolerate fluids and food  - flu test negative - follow up if no better

## 2021-12-01 NOTE — Addendum Note (Signed)
Addended byAnnamaria Helling on: 12/01/2021 03:43 PM   Modules accepted: Orders

## 2021-12-01 NOTE — Progress Notes (Signed)
   Acute Office Visit  Subjective:     Patient ID: Nicole Giles, female    DOB: 06-25-1945, 76 y.o.   MRN: [id number]  Chief Complaint  Patient presents with   Headache    Pt presents with headaches, dizziness, lightheaded, blurred vision, and sweats that started yesterday. It started when she woke up last night. She feels like she has a "ring around her head." She broke out in sweats and was soaked. Admits to nausea and dizziness. She took her sugars and it was 190s today. She hasn't been able to eat today due to nausea. She has been drinking fluids. She ran out of her synthroid and missed two doses, but took her medication this morning. Has not had any recent sick contacts.     Review of Systems  Constitutional:  Positive for fever. Negative for chills.  Respiratory:  Negative for cough and shortness of breath.   Cardiovascular:  Negative for chest pain.  Gastrointestinal:  Positive for constipation and nausea. Negative for abdominal pain, diarrhea and vomiting.  Neurological:  Positive for dizziness, weakness and headaches.        Objective:    BP (!) 107/55   Pulse 83   Ht '4\' 11"'$  (1.499 m)   Wt 215 lb (97.5 kg)   BMI 43.42 kg/m   Physical Exam Vitals and nursing note reviewed.  Constitutional:      General: She is not in acute distress.    Appearance: Normal appearance.  HENT:     Head: Normocephalic and atraumatic.     Right Ear: External ear normal.     Left Ear: External ear normal.     Nose: Nose normal.  Eyes:     Conjunctiva/sclera: Conjunctivae normal.  Cardiovascular:     Rate and Rhythm: Normal rate and regular rhythm.  Pulmonary:     Effort: Pulmonary effort is normal.     Breath sounds: Normal breath sounds.  Musculoskeletal:     Comments: Msk strength 5/5 upper and lower extremity bilaterally  Neurological:     General: No focal deficit present.     Mental Status: She is alert and oriented to person, place, and time.  Psychiatric:        Mood  and Affect: Mood normal.        Behavior: Behavior normal.        Thought Content: Thought content normal.        Judgment: Judgment normal.     No results found for any visits on 12/01/21.      Assessment & Plan:   Problem List Items Addressed This Visit       Other   DIZZINESS - Primary    - Pt has hx of vertigo. Will order meclizine - pt has been able to tolerate fluids and food  - follow up if no better        Relevant Medications   meclizine (ANTIVERT) 25 MG tablet   Weakness    Meds ordered this encounter  Medications   meclizine (ANTIVERT) 25 MG tablet    Sig: Take 1 tablet (25 mg total) by mouth 3 (three) times daily as needed for dizziness.    Dispense:  30 tablet    Refill:  0    Return if symptoms worsen or fail to improve.  Owens Loffler, DO

## 2021-12-02 ENCOUNTER — Telehealth: Payer: Medicare PPO

## 2021-12-02 ENCOUNTER — Telehealth: Payer: Self-pay

## 2021-12-02 DIAGNOSIS — M542 Cervicalgia: Secondary | ICD-10-CM | POA: Diagnosis not present

## 2021-12-02 DIAGNOSIS — M6281 Muscle weakness (generalized): Secondary | ICD-10-CM | POA: Diagnosis not present

## 2021-12-02 DIAGNOSIS — M545 Low back pain, unspecified: Secondary | ICD-10-CM | POA: Diagnosis not present

## 2021-12-02 DIAGNOSIS — M546 Pain in thoracic spine: Secondary | ICD-10-CM | POA: Diagnosis not present

## 2021-12-02 DIAGNOSIS — R2689 Other abnormalities of gait and mobility: Secondary | ICD-10-CM | POA: Diagnosis not present

## 2021-12-02 DIAGNOSIS — R29898 Other symptoms and signs involving the musculoskeletal system: Secondary | ICD-10-CM | POA: Diagnosis not present

## 2021-12-02 NOTE — Telephone Encounter (Signed)
  Care Management   Follow Up Note   12/02/2021 Name: Nicole Giles MRN: [id number] DOB: Apr 21, 1946   Referred by: Donella Stade, PA-C Reason for referral : No chief complaint on file.   An unsuccessful telephone outreach was attempted today. The patient was referred to the case management team for assistance with care management and care coordination.   Follow Up Plan: The care management team will reach out to the patient again over the next 30 days.   Thea Silversmith, RN, MSN, BSN, CCM Care Management Coordinator [facility] [phone]

## 2021-12-09 DIAGNOSIS — M545 Low back pain, unspecified: Secondary | ICD-10-CM | POA: Diagnosis not present

## 2021-12-09 DIAGNOSIS — M6281 Muscle weakness (generalized): Secondary | ICD-10-CM | POA: Diagnosis not present

## 2021-12-09 DIAGNOSIS — R2689 Other abnormalities of gait and mobility: Secondary | ICD-10-CM | POA: Diagnosis not present

## 2021-12-09 DIAGNOSIS — R6889 Other general symptoms and signs: Secondary | ICD-10-CM | POA: Diagnosis not present

## 2021-12-09 DIAGNOSIS — R293 Abnormal posture: Secondary | ICD-10-CM | POA: Diagnosis not present

## 2021-12-11 ENCOUNTER — Telehealth: Payer: Self-pay | Admitting: *Deleted

## 2021-12-11 NOTE — Chronic Care Management (AMB) (Signed)
  Care Coordination Note  12/11/2021 Name: [name] MRN: [id number] DOB: 09-24-1945  Nicole Giles is a 76 y.o. year old female who is a primary care patient of Lavada Mesi and is actively engaged with the care management team. I reached out to [name] by phone today to assist with re-scheduling a follow up visit with the RN Case Manager  Follow up plan: Unsuccessful telephone outreach attempt made. A HIPAA compliant phone message was left for the patient providing contact information and requesting a return call.   Julian Hy, [place] Direct Dial: [phone]

## 2021-12-16 ENCOUNTER — Ambulatory Visit: Payer: Self-pay

## 2021-12-16 ENCOUNTER — Telehealth: Payer: Medicare PPO

## 2021-12-16 NOTE — Chronic Care Management (AMB) (Signed)
Chronic Care Management   CCM RN Visit Note  12/16/2021 Name: Nicole Giles MRN: [id number] DOB: Nov 11, 1945  Subjective: Nicole Giles is a 76 y.o. year old female who is a primary care patient of Nicole Stade, PA-C. The care management team was consulted for assistance with disease management and care coordination needs.    Engaged with patient by telephone for follow up visit in response to provider referral for case management and/or care coordination services.   Consent to Services:  The patient was given information about Chronic Care Management services, agreed to services, and gave verbal consent prior to initiation of services.  Please see initial visit note for detailed documentation.   Patient agreed to services and verbal consent obtained.   Assessment: Review of patient past medical history, allergies, medications, health status, including review of consultants reports, laboratory and other test data, was performed as part of comprehensive evaluation and provision of chronic care management services.   SDOH (Social Determinants of Health) assessments and interventions performed:    CCM Care Plan  Allergies  Allergen Reactions   Codeine    Lipitor [Atorvastatin Calcium]     myaglia   Meperidine Hcl     Outpatient Encounter Medications as of 12/16/2021  Medication Sig Note   amitriptyline (ELAVIL) 25 MG tablet Take one to two tablets at bedtime for sleep.    Calcium Citrate-Vitamin D 250-2.5 MG-MCG TABS Take by mouth.    diclofenac Sodium (VOLTAREN) 1 % GEL Apply 4 g topically 4 (four) times daily. To affected joint. 10/28/2021: Reports uses as needed   empagliflozin (JARDIANCE) 25 MG TABS tablet Take 1 tablet (25 mg total) by mouth daily before breakfast. For diabetes.    esomeprazole (NEXIUM) 40 MG capsule Take 1 capsule (40 mg total) by mouth daily at 12 noon.    glipiZIDE (GLUCOTROL) 5 MG tablet Take 1 tablet (5 mg total) by mouth 2 (two) times daily before a  meal. For diabetes.    hydrochlorothiazide (HYDRODIURIL) 12.5 MG tablet Take 1 tablet (12.5 mg total) by mouth daily. As needed for swelling.    levothyroxine (SYNTHROID) 25 MCG tablet TAKE 1 TABLET BY MOUTH ONCE DAILY    meclizine (ANTIVERT) 25 MG tablet Take 1 tablet (25 mg total) by mouth 3 (three) times daily as needed for dizziness.    metFORMIN (GLUCOPHAGE) 500 MG tablet Take 1 tablet (500 mg total) by mouth 2 (two) times daily with a meal. For diabetes.    Multiple Vitamin (MULTIVITAMIN) capsule Take 1 capsule by mouth daily.    rosuvastatin (CRESTOR) 10 MG tablet Take 1 tablet (10 mg total) by mouth daily.    doxycycline (VIBRA-TABS) 100 MG tablet Take 1 tablet (100 mg total) by mouth 2 (two) times daily. (Patient not taking: Reported on 12/16/2021)    Lancets 33G MISC Dx DM E11.65. Check fasting blood sugar every morning.    No facility-administered encounter medications on file as of 12/16/2021.    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   Nausea 04/02/2019  Controlled type 2 diabetes mellitus with hyperglycemia, without long-term current use of insulin ([place]) 12/15/2018   Baker's cyst of knee, right 11/22/2018   History of pulmonary embolus (PE) 07/13/2018   Fatty liver 06/16/2018   Fibroadenoma of right breast 05/12/2018   Diverticulosis 05/12/2018   Internal hemorrhoids 05/12/2018   Colon polyp 05/12/2018   Post-traumatic osteoarthritis of right knee 11/30/2017   Pain of left calf 01/16/2017   Weakness  09/19/2016   Elevated C-reactive protein (CRP) 09/19/2016   Acute pain of left shoulder 09/19/2016   Arthritis of shoulder region, left, degenerative 09/17/2016   No energy 08/19/2016   Myalgia 08/19/2016   Cough 08/19/2016   Polymyalgia ([place]) 08/11/2016   Peripheral edema 12/31/2015   Elevated blood pressure 12/31/2015   Primary osteoarthritis of right knee 08/15/2014   Elevated fasting glucose 01/16/2014   Hyperlipidemia LDL goal <70 12/17/2013   Arthralgia 12/17/2013   Body aches 12/17/2013   INJURY OTHER AND UNSPECIFIED FINGER 10/04/2008   FREQUENCY, URINARY 09/09/2008   FATTY LIVER DISEASE 09/04/2008   UNSPECIFIED DISORDER OF KIDNEY AND URETER 09/02/2008   TRANSAMINASES, SERUM, ELEVATED 08/27/2008   GERD 08/22/2008   DIZZINESS 08/22/2008   Dyspnea 08/22/2008   CERVICALGIA 06/13/2008   Pain in limb 11/13/2007   OSA on CPAP 09/21/2007   HYPERLIPIDEMIA 08/28/2007   DEPENDENT EDEMA, LEGS 08/25/2007   CHEST PAIN 06/30/2007   ARTHRITIS, KNEES, BILATERAL 06/01/2007   VERTIGO, BENIGN PAROXYSMAL POSITION 02/15/2007   FATIGUE 12/08/2006   Morbid obesity ([place]) 11/17/2006   HEADACHE, TENSION 10/12/2006    Conditions to be addressed/monitored:DMII  Care Plan : RN Care Manager Plan of Care  Updates made by Nicole Rued, RN since 12/16/2021 12:00 AM  Completed 12/16/2021   Problem: Disease Managment Education and/or Care Coordination needs Resolved 12/16/2021  Priority: High     Long-Range Goal: Development of plan of care for Disease Management and/or care coordination needs Completed 12/16/2021  Start Date: 10/28/2021  Expected End Date: 01/28/2022  Priority: High  Note:   Goals closed due to duplicate goals Current Barriers:  12/16/21 Nicole Giles reports blood sugars ranging 119-154 typically. She states blood sugar "230 something", but adds she had not taken her glipizide in a couple days due to need to refill. She states she picked up her refill and has all her medications now.   Knowledge Deficits related to plan of care for management of HLD and DMII  Care Coordination needs related to Financial constraints related to Utilities, Medication procurement, and Lacks knowledge of community resource: possible assistance for Utilities: Adult nurse in [place], forensic in summer.  Non-adherence to prescribed medication regimen Difficulty obtaining medications No Advanced Directives in place- request Advance directive packet  RNCM Clinical Goal(s):  Patient will verbalize understanding of plan for management of DMII as evidenced by self report and or notation in chart work with pharmacist to address Medication procurement and medication adherence related to DMII as evidenced by review of EMR and patient or pharmacist report    work with community resource care guide to address needs related to Financial constraints related to resources for assistance with heat in winter and electric in summer as evidenced by patient and/or community resource care guide support    through collaboration with Consulting civil engineer, provider, and care team.   Interventions: Collaboration with primary care provider regarding care management services as needed Inter-disciplinary care team collaboration (see longitudinal plan of care) Evaluation of current treatment plan related to  self management and  patient's adherence to plan as established by provider  Diabetes Interventions:  (Status:  Goal on track:  Yes.) Long Term Goal Assessed patient's understanding of A1c goal: <7% Provided education to patient about basic DM disease process Reviewed medications with patient and discussed importance of medication adherence Reviewed scheduled/upcoming provider appointments including: clinical pharmacist, outpatient rehab, neurosurgery and PCP Encouraged to continue to attend outpatient physical therapy sessions as recommended Lab Results  Component Value Date   HGBA1C 7.3 (A) 08/12/2021  Patient Goals/Self-Care  Activities: Take medications as prescribed   Attend all scheduled provider appointments Call pharmacy for medication refills 3-7 days in advance of running out of medications Call provider office for new concerns or questions  Check and record blood sugar as recommended by provider. Notify provider if consistently outside of recommended range Continue to attend outpatient physical therapy sessions as recommended     Plan:Telephone follow up appointment with care management team member scheduled for:  01/14/22  Thea Silversmith, RN, MSN, BSN, CCM Care Management Coordinator [facility] [phone]

## 2021-12-16 NOTE — Patient Instructions (Addendum)
Visit Information  Thank you for taking time to visit with me today. Please don't hesitate to contact me if I can be of assistance to you before our next scheduled telephone appointment.  Following are the goals we discussed today:  Patient Goals/Self-Care Activities: Contact your primary care provider office to schedule your annual wellness visit Take medications as prescribed   Attend all scheduled provider appointments Call pharmacy for medication refills 3-7 days in advance of running out of medications Call provider office for new concerns or questions  Check and record blood sugar as recommended by provider. Notify provider if consistently outside of recommended range Continue to attend outpatient physical therapy sessions as recommended  Our next appointment is by telephone on 01/14/22 at 9:30 am  Please call the care guide team at [phone] if you need to cancel or reschedule your appointment.   If you are experiencing a Mental Health or [place] or need someone to talk to, please call the Suicide and Crisis Lifeline: 988   Patient verbalizes understanding of instructions and care plan provided today and agrees to view in [place]. Active MyChart status and patient understanding of how to access instructions and care plan via MyChart confirmed with patient.     Thea Silversmith, RN, MSN, BSN, CCM Care Management Coordinator [facility] [phone]

## 2021-12-21 ENCOUNTER — Other Ambulatory Visit: Payer: Self-pay | Admitting: Neurology

## 2021-12-21 DIAGNOSIS — L578 Other skin changes due to chronic exposure to nonionizing radiation: Secondary | ICD-10-CM | POA: Diagnosis not present

## 2021-12-21 DIAGNOSIS — D225 Melanocytic nevi of trunk: Secondary | ICD-10-CM | POA: Diagnosis not present

## 2021-12-21 DIAGNOSIS — L814 Other melanin hyperpigmentation: Secondary | ICD-10-CM | POA: Diagnosis not present

## 2021-12-21 DIAGNOSIS — L821 Other seborrheic keratosis: Secondary | ICD-10-CM | POA: Diagnosis not present

## 2021-12-21 MED ORDER — TRUE METRIX BLOOD GLUCOSE TEST VI STRP: ORAL_STRIP | 99 refills | Status: DC

## 2021-12-23 DIAGNOSIS — R2689 Other abnormalities of gait and mobility: Secondary | ICD-10-CM | POA: Diagnosis not present

## 2021-12-23 DIAGNOSIS — R6889 Other general symptoms and signs: Secondary | ICD-10-CM | POA: Diagnosis not present

## 2021-12-23 DIAGNOSIS — M6281 Muscle weakness (generalized): Secondary | ICD-10-CM | POA: Diagnosis not present

## 2021-12-23 DIAGNOSIS — M545 Low back pain, unspecified: Secondary | ICD-10-CM | POA: Diagnosis not present

## 2021-12-23 DIAGNOSIS — R293 Abnormal posture: Secondary | ICD-10-CM | POA: Diagnosis not present

## 2021-12-30 ENCOUNTER — Encounter: Payer: Self-pay | Admitting: General Practice

## 2022-01-07 ENCOUNTER — Telehealth (INDEPENDENT_AMBULATORY_CARE_PROVIDER_SITE_OTHER): Payer: Medicare PPO | Admitting: Family Medicine

## 2022-01-07 VITALS — Ht 59.0 in | Wt 215.0 lb

## 2022-01-07 DIAGNOSIS — R112 Nausea with vomiting, unspecified: Secondary | ICD-10-CM | POA: Diagnosis not present

## 2022-01-07 DIAGNOSIS — R197 Diarrhea, unspecified: Secondary | ICD-10-CM | POA: Diagnosis not present

## 2022-01-07 NOTE — Progress Notes (Signed)
   Virtual Visit via Telephone Note  I connected with Nicole Giles on 01/07/22 at  1:00 PM EDT by telephone and verified that I am speaking with the correct person using two identifiers.   I discussed the limitations, risks, security and privacy concerns of performing an evaluation and management service by telephone and the availability of in person appointments. I also discussed with the patient that there may be a patient responsible charge related to this service. The patient expressed understanding and agreed to proceed.  Patient location: Provider loccation: In office   Subjective:    CC:   Chief Complaint  Patient presents with   Diarrhea   Vomiting    WGN:[id number] with N/V/D on Friday about 7 days ago about 30 min after eating a corn dog. Vomited 5-6 times. Taking Imodium and seems a little better as of yesterday. No other sick contacts. Last time vomited Wednesday bottom of stomach was hurting. + gassy. No blood in stool. Was constipated before all this happen.     Past medical history, Surgical history, Family history not pertinant except as noted below, Social history, Allergies, and medications have been entered into the medical record, reviewed, and corrections made.   Review of Systems: No fevers, chills, night sweats, weight loss, chest pain, or shortness of breath.   Objective:    General: Speaking clearly in complete sentences without any shortness of breath.  Alert and oriented x3.  Normal judgment. No apparent acute distress.    Impression and Recommendations:    Problem List Items Addressed This Visit   None Visit Diagnoses     Diarrhea, unspecified type    -  Primary   Relevant Orders   CBC with Differential/Platelet   COMPLETE METABOLIC PANEL WITH GFR   Hepatitis, Acute   Lipase   Nausea and vomiting, unspecified vomiting type          N/V/D most suspicious of food contamination.  She is a little bit better in the last 24 hours which is  reassuring.  We discussed option of getting additional labs tomorrow if she is not continuing to feel better.  If she feels tremendously better tomorrow then she can always not come for the blood work.  If diarrhea persists into next week then consider getting a stool culture but she would have to hold the Imodium.  No blood which is reassuring.  No orders of the defined types were placed in this encounter.   No orders of the defined types were placed in this encounter.    I discussed the assessment and treatment plan with the patient. The patient was provided an opportunity to ask questions and all were answered. The patient agreed with the plan and demonstrated an understanding of the instructions.   The patient was advised to call back or seek an in-person evaluation if the symptoms worsen or if the condition fails to improve as anticipated.  I provided 15 minutes of non-face-to-face time during this encounter.   Beatrice Lecher, MD

## 2022-01-07 NOTE — Progress Notes (Signed)
Onset Friday 01/01/2022 Vomiting and Diarrhea Diarrhea after eating  Lower abdominal pain Taking Imodium Seems to be getting better Did take a Covid home test yesterday which was negative

## 2022-01-08 ENCOUNTER — Telehealth: Payer: Self-pay | Admitting: Physician Assistant

## 2022-01-08 DIAGNOSIS — R197 Diarrhea, unspecified: Secondary | ICD-10-CM | POA: Diagnosis not present

## 2022-01-08 DIAGNOSIS — R748 Abnormal levels of other serum enzymes: Secondary | ICD-10-CM | POA: Diagnosis not present

## 2022-01-08 NOTE — Telephone Encounter (Signed)
Patient had labs drawn today. Called, LVM for patient to call back to see if she has any medication to help with nausea. Awaiting call back.

## 2022-01-08 NOTE — Telephone Encounter (Signed)
Pt was here for labs and wanted Nicole Giles to know she is still having Nausea  and cant eat and feeling weak. No more Diarrhea and vomiting

## 2022-01-08 NOTE — Telephone Encounter (Signed)
Patient saw Dr. Madilyn Fireman for this virtually this week. Please advise.

## 2022-01-11 LAB — HEPATITIS PANEL, ACUTE
Hep A IgM: NONREACTIVE
Hep B C IgM: NONREACTIVE
Hepatitis B Surface Ag: NONREACTIVE
Hepatitis C Ab: NONREACTIVE

## 2022-01-11 LAB — CBC WITH DIFFERENTIAL/PLATELET
Absolute Monocytes: 444 cells/uL (ref 200–950)
Basophils Absolute: 42 cells/uL (ref 0–200)
Basophils Relative: 0.7 %
Eosinophils Absolute: 270 cells/uL (ref 15–500)
Eosinophils Relative: 4.5 %
HCT: 46.3 % — ABNORMAL HIGH (ref 35.0–45.0)
Hemoglobin: 15.5 g/dL (ref 11.7–15.5)
Lymphs Abs: 2340 cells/uL (ref 850–3900)
MCH: 31.8 pg (ref 27.0–33.0)
MCHC: 33.5 g/dL (ref 32.0–36.0)
MCV: 94.9 fL (ref 80.0–100.0)
MPV: 11.3 fL (ref 7.5–12.5)
Monocytes Relative: 7.4 %
Neutro Abs: 2904 cells/uL (ref 1500–7800)
Neutrophils Relative %: 48.4 %
Platelets: 214 10*3/uL (ref 140–400)
RBC: 4.88 10*6/uL (ref 3.80–5.10)
RDW: 12.2 % (ref 11.0–15.0)
Total Lymphocyte: 39 %
WBC: 6 10*3/uL (ref 3.8–10.8)

## 2022-01-11 LAB — COMPLETE METABOLIC PANEL WITH GFR
AG Ratio: 1.5 (calc) (ref 1.0–2.5)
ALT: 32 U/L — ABNORMAL HIGH (ref 6–29)
AST: 40 U/L — ABNORMAL HIGH (ref 10–35)
Albumin: 4.1 g/dL (ref 3.6–5.1)
Alkaline phosphatase (APISO): 141 U/L (ref 37–153)
BUN: 13 mg/dL (ref 7–25)
CO2: 21 mmol/L (ref 20–32)
Calcium: 9 mg/dL (ref 8.6–10.4)
Chloride: 105 mmol/L (ref 98–110)
Creat: 0.76 mg/dL (ref 0.60–1.00)
Globulin: 2.7 g/dL (calc) (ref 1.9–3.7)
Glucose, Bld: 174 mg/dL — ABNORMAL HIGH (ref 65–99)
Potassium: 4 mmol/L (ref 3.5–5.3)
Sodium: 138 mmol/L (ref 135–146)
Total Bilirubin: 1 mg/dL (ref 0.2–1.2)
Total Protein: 6.8 g/dL (ref 6.1–8.1)
eGFR: 82 mL/min/{1.73_m2} (ref >=60)

## 2022-01-11 LAB — LIPASE: Lipase: 21 U/L (ref 7–60)

## 2022-01-11 NOTE — Progress Notes (Signed)
HI Nicole Giles, your blood count is good.  Your liver enzymes at still mildly elevated. Similar to last time No acute hepatitis and no sign of pancreatitis.

## 2022-01-12 ENCOUNTER — Ambulatory Visit: Payer: Medicare PPO | Admitting: Physician Assistant

## 2022-01-12 DIAGNOSIS — E1165 Type 2 diabetes mellitus with hyperglycemia: Secondary | ICD-10-CM

## 2022-01-12 NOTE — Telephone Encounter (Signed)
Tried to call patient again with no answer 

## 2022-01-14 ENCOUNTER — Ambulatory Visit: Payer: Self-pay

## 2022-01-14 DIAGNOSIS — H43391 Other vitreous opacities, right eye: Secondary | ICD-10-CM | POA: Diagnosis not present

## 2022-01-14 NOTE — Patient Outreach (Signed)
  Care Coordination   Follow Up Visit Note   01/14/2022 Name: Nicole Giles MRN: [id number] DOB: May 07, 1946  Nicole Giles is a 75 y.o. year old female who sees [place], Royetta Car, [place] for primary care. I spoke with  [name] by phone today.  What matters to the patients health and wellness today?  Education regarding diabetes diet    Goals Addressed             This Visit's Progress    Diabetes-request education regarding diet       Care Coordination Interventions: Reviewed medications with patient and discussed importance of medication adherence Provided education regarding diabetes diet Discussed Diabetes/Nutrition educator Message to PCP to notify/request referral Discussed annual wellness visit and benefit. Patient encouraged to call provider office to schedule.         SDOH assessments and interventions completed:  Yes  SDOH Interventions Today    Flowsheet Row Most Recent Value  SDOH Interventions   Food Insecurity Interventions Intervention Not Indicated  Transportation Interventions Intervention Not Indicated  Utilities Interventions Intervention Not Indicated        Care Coordination Interventions Activated:  Yes  Care Coordination Interventions:  Yes, provided   Follow up plan: Follow up call scheduled for 02/15/22    Encounter Outcome:  Pt. Visit Completed

## 2022-01-14 NOTE — Patient Instructions (Signed)
Visit Information  Thank you for taking time to visit with me today. Please don't hesitate to contact me if I can be of assistance to you.   Following are the goals we discussed today:   Goals Addressed             This Visit's Progress    Diabetes-request education regarding diet       Care Coordination Interventions: Reviewed medications with patient and discussed importance of medication adherence Provided education regarding diabetes diet Discussed Diabetes/Nutrition educator Message to PCP to notify/request referral Discussed annual wellness visit and benefit. Patient encouraged to call provider office to schedule.         Our next appointment is by telephone on 02/15/22 at 9:30 am  Please call the care guide team at [phone] if you need to cancel or reschedule your appointment.   If you are experiencing a Mental Health or [place] or need someone to talk to, please call the Suicide and Crisis Lifeline: 988  Patient verbalizes understanding of instructions and care plan provided today and agrees to view in [place]. Active MyChart status and patient understanding of how to access instructions and care plan via MyChart confirmed with patient.     Diabetes Mellitus and Nutrition, Adult When you have diabetes, or diabetes mellitus, it is very important to have healthy eating habits because your blood sugar (glucose) levels are greatly affected by what you eat and drink. Eating healthy foods in the right amounts, at about the same times every day, can help you: Manage your blood glucose. Lower your risk of heart disease. Improve your blood pressure. Reach or maintain a healthy weight. What can affect my meal plan? Every person with diabetes is different, and each person has different needs for a meal plan. Your health care provider may recommend that you work with a dietitian to make a meal plan that is best for you. Your meal plan may vary depending on  factors such as: The calories you need. The medicines you take. Your weight. Your blood glucose, blood pressure, and cholesterol levels. Your activity level. Other health conditions you have, such as heart or kidney disease. How do carbohydrates affect me? Carbohydrates, also called carbs, affect your blood glucose level more than any other type of food. Eating carbs raises the amount of glucose in your blood. It is important to know how many carbs you can safely have in each meal. This is different for every person. Your dietitian can help you calculate how many carbs you should have at each meal and for each snack. How does alcohol affect me? Alcohol can cause a decrease in blood glucose (hypoglycemia), especially if you use insulin or take certain diabetes medicines by mouth. Hypoglycemia can be a life-threatening condition. Symptoms of hypoglycemia, such as sleepiness, dizziness, and confusion, are similar to symptoms of having too much alcohol. Do not drink alcohol if: Your health care provider tells you not to drink. You are pregnant, may be pregnant, or are planning to become pregnant. If you drink alcohol: Limit how much you have to: 0-1 drink a day for women. 0-2 drinks a day for men. Know how much alcohol is in your drink. In the [place] one drink equals one 12 oz bottle of beer (355 mL), one 5 oz glass of wine (148 mL), or one 1 oz glass of hard liquor (44 mL). Keep yourself hydrated with water, diet soda, or unsweetened iced tea. Keep in mind that regular soda, juice,  and other mixers may contain a lot of sugar and must be counted as carbs. What are tips for following this plan?  Reading food labels Start by checking the serving size on the Nutrition Facts label of packaged foods and drinks. The number of calories and the amount of carbs, fats, and other nutrients listed on the label are based on one serving of the item. Many items contain more than one serving per package. Check  the total grams (g) of carbs in one serving. Check the number of grams of saturated fats and trans fats in one serving. Choose foods that have a low amount or none of these fats. Check the number of milligrams (mg) of salt (sodium) in one serving. Most people should limit total sodium intake to less than 2,300 mg per day. Always check the nutrition information of foods labeled as "low-fat" or "nonfat." These foods may be higher in added sugar or refined carbs and should be avoided. Talk to your dietitian to identify your daily goals for nutrients listed on the label. Shopping Avoid buying canned, pre-made, or processed foods. These foods tend to be high in fat, sodium, and added sugar. Shop around the outside edge of the grocery store. This is where you will most often find fresh fruits and vegetables, bulk grains, fresh meats, and fresh dairy products. Cooking Use low-heat cooking methods, such as baking, instead of high-heat cooking methods, such as deep frying. Cook using healthy oils, such as olive, canola, or sunflower oil. Avoid cooking with butter, cream, or high-fat meats. Meal planning Eat meals and snacks regularly, preferably at the same times every day. Avoid going long periods of time without eating. Eat foods that are high in fiber, such as fresh fruits, vegetables, beans, and whole grains. Eat 4-6 oz (112-168 g) of lean protein each day, such as lean meat, chicken, fish, eggs, or tofu. One ounce (oz) (28 g) of lean protein is equal to: 1 oz (28 g) of meat, chicken, or fish. 1 egg.  cup (62 g) of tofu. Eat some foods each day that contain healthy fats, such as avocado, nuts, seeds, and fish. What foods should I eat? Fruits Berries. Apples. Oranges. Peaches. Apricots. Plums. Grapes. Mangoes. Papayas. Pomegranates. Kiwi. Cherries. Vegetables Leafy greens, including lettuce, spinach, kale, chard, collard greens, mustard greens, and cabbage. Beets. Cauliflower. Broccoli. Carrots.  Green beans. Tomatoes. Peppers. Onions. Cucumbers. Brussels sprouts. Grains Whole grains, such as whole-wheat or whole-grain bread, crackers, tortillas, cereal, and pasta. Unsweetened oatmeal. Quinoa. Brown or wild rice. Meats and other proteins Seafood. Poultry without skin. Lean cuts of poultry and beef. Tofu. Nuts. Seeds. Dairy Low-fat or fat-free dairy products such as milk, yogurt, and cheese. The items listed above may not be a complete list of foods and beverages you can eat and drink. Contact a dietitian for more information. What foods should I avoid? Fruits Fruits canned with syrup. Vegetables Canned vegetables. Frozen vegetables with butter or cream sauce. Grains Refined white flour and flour products such as bread, pasta, snack foods, and cereals. Avoid all processed foods. Meats and other proteins Fatty cuts of meat. Poultry with skin. Breaded or fried meats. Processed meat. Avoid saturated fats. Dairy Full-fat yogurt, cheese, or milk. Beverages Sweetened drinks, such as soda or iced tea. The items listed above may not be a complete list of foods and beverages you should avoid. Contact a dietitian for more information. Questions to ask a health care provider Do I need to meet with a certified diabetes  care and education specialist? Do I need to meet with a dietitian? What number can I call if I have questions? When are the best times to check my blood glucose? Where to find more information: American Diabetes Association: diabetes.org Academy of Nutrition and Dietetics: eatright.[employer] of Diabetes and Digestive and Kidney Diseases: [employer] Association of Diabetes Care & Education Specialists: diabeteseducator.org Summary It is important to have healthy eating habits because your blood sugar (glucose) levels are greatly affected by what you eat and drink. It is important to use alcohol carefully. A healthy meal plan will help you manage your blood  glucose and lower your risk of heart disease. Your health care provider may recommend that you work with a dietitian to make a meal plan that is best for you. This information is not intended to replace advice given to you by your health care provider. Make sure you discuss any questions you have with your health care provider. Document Revised: 11/28/2019 Document Reviewed: 11/28/2019 Elsevier Patient Education  [place].

## 2022-01-18 ENCOUNTER — Other Ambulatory Visit: Payer: Self-pay | Admitting: Physician Assistant

## 2022-01-18 DIAGNOSIS — E1165 Type 2 diabetes mellitus with hyperglycemia: Secondary | ICD-10-CM

## 2022-01-20 NOTE — Chronic Care Management (AMB) (Signed)
Per [facility] RN notes pt spoke to nurse on 01/14/2022 [place] pod3  Julian [place], [place] Direct Dial: [phone]

## 2022-02-15 ENCOUNTER — Telehealth: Payer: Self-pay

## 2022-02-15 NOTE — Patient Outreach (Signed)
  Care Coordination   02/15/2022 Name: LECHELLE WRIGLEY MRN: [id number] DOB: 11-Dec-1945   Care Coordination Outreach Attempts:  An unsuccessful telephone outreach was attempted today to offer the patient information about available care coordination services as a benefit of their health plan.   Follow Up Plan:  Additional outreach attempts will be made to offer the patient care coordination information and services.   Encounter Outcome:  No Answer  Care Coordination Interventions Activated:  No   Care Coordination Interventions:  No, not indicated    Thea Silversmith, RN, MSN, BSN, [place] Coordinator [phone]

## 2022-02-18 DIAGNOSIS — E1165 Type 2 diabetes mellitus with hyperglycemia: Secondary | ICD-10-CM | POA: Diagnosis not present

## 2022-02-23 ENCOUNTER — Ambulatory Visit: Payer: Self-pay

## 2022-02-23 NOTE — Patient Outreach (Signed)
  Care Coordination   Follow Up Visit Note   02/23/2022 Name: Nicole Giles MRN: [id number] DOB: 1946-04-11  Nicole Giles is a 76 y.o. year old female who sees Nicole Giles, [place] for primary care. I spoke with  Nicole Giles by phone today.  What matters to the patients health and wellness today?  Nicole Giles reports she has not checked blood sugar this morning. She reports blood sugar when last checked was 111. She states blood sugar usually ranges 150-160. Per review of chart, last office visit with primary care was on 10/08/21 with follow up recommended in 3 months. Patient did not attend 3 month follow up. Patient is with no questions or concerns today.    Goals Addressed             This Visit's Progress    Diabetes-request education regarding diet       Care Coordination Interventions: Medications reviewed and encouraged to continue to take as prescribed Reviewed goals patient discussed with Diabetes/Nutrition educator Discussed upcoming Holidays, encouraged patient to monitor portion sizes and avoid concentrated sweets, saturated fats and transfats Encouraged patient to schedule follow up with provider Discussed target blood sugar range 80-130 before meals and <180 about 2 hours after meals or per provider recommendations. Encouraged to discuss with provider.      SDOH assessments and interventions completed:  No  Care Coordination Interventions Activated:  Yes  Care Coordination Interventions:  Yes, provided   Follow up plan: Follow up call scheduled for 04/09/22    Encounter Outcome:  Pt. Visit Completed   Nicole Silversmith, RN, MSN, BSN, [place] Coordinator [phone]

## 2022-03-09 ENCOUNTER — Other Ambulatory Visit: Payer: Self-pay | Admitting: Physician Assistant

## 2022-03-09 DIAGNOSIS — E1165 Type 2 diabetes mellitus with hyperglycemia: Secondary | ICD-10-CM

## 2022-03-12 ENCOUNTER — Ambulatory Visit (INDEPENDENT_AMBULATORY_CARE_PROVIDER_SITE_OTHER): Payer: Medicare PPO | Admitting: Physician Assistant

## 2022-03-12 DIAGNOSIS — Z0000 Encounter for general adult medical examination without abnormal findings: Secondary | ICD-10-CM

## 2022-03-12 DIAGNOSIS — Z780 Asymptomatic menopausal state: Secondary | ICD-10-CM

## 2022-03-12 DIAGNOSIS — Z1231 Encounter for screening mammogram for malignant neoplasm of breast: Secondary | ICD-10-CM

## 2022-03-12 NOTE — Progress Notes (Signed)
MEDICARE ANNUAL WELLNESS VISIT  03/12/2022  Telephone Visit Disclaimer This Medicare AWV was conducted by telephone due to national recommendations for restrictions regarding the COVID-19 Pandemic (e.g. social distancing).  I verified, using two identifiers, that I am speaking with Nicole Giles or their authorized healthcare agent. I discussed the limitations, risks, security, and privacy concerns of performing an evaluation and management service by telephone and the potential availability of an in-person appointment in the future. The patient expressed understanding and agreed to proceed.  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 female patient of Nicole Giles, Nicole Car, PA-C who had a [employer] Visit today via telephone. Nicole Giles is Working full time and lives alone. she has 3 children. she reports that she is socially active and does interact with friends/family regularly. she is minimally physically active and enjoys working, watching television and watching craft shows.  Patient Care Team: Lavada Mesi as PCP - General (Family Medicine) Darius Bump, [facility] (Pharmacist) Luretha Rued, RN as Case Manager     03/12/2022    1:36 PM 10/28/2021    3:39 PM 06/04/2020   10:53 AM 03/27/2020   10:39 AM  Advanced Directives  Does Patient Have a Medical Advance Directive? No No No No  Would patient like information on creating a medical advance directive? No - Patient declined Yes (MAU/Ambulatory/Procedural Areas - Information given) No - Patient declined No - Patient declined    Hospital Utilization Over the Past 12 Months: # of hospitalizations or ER visits: 0 # of surgeries: 1  Review of Systems    Patient reports that her overall health is better compared to last year.  History obtained from chart review and the patient  Patient Reported Readings (BP, Pulse, CBG, Weight, etc) none  Pain  Assessment Pain : No/denies pain     Current Medications & Allergies (verified) Allergies as of 03/12/2022       Reactions   Codeine    Lipitor [atorvastatin Calcium]    myaglia   Meperidine Hcl         Medication List        Accurate as of March 12, 2022  1:51 PM. If you have any questions, ask your nurse or doctor.          amitriptyline 25 MG tablet Commonly known as: ELAVIL Take one to two tablets at bedtime for sleep.   Calcium Citrate-Vitamin D 250-2.5 MG-MCG Tabs Take by mouth.   diclofenac Sodium 1 % Gel Commonly known as: VOLTAREN Apply 4 g topically 4 (four) times daily. To affected joint.   esomeprazole 40 MG capsule Commonly known as: NexIUM Take 1 capsule (40 mg total) by mouth daily at 12 noon.   glipiZIDE 5 MG tablet Commonly known as: GLUCOTROL Take 1 tablet (5 mg total) by mouth 2 (two) times daily before a meal. For diabetes.   hydrochlorothiazide 12.5 MG tablet Commonly known as: HYDRODIURIL Take 1 tablet (12.5 mg total) by mouth daily. As needed for swelling.   Jardiance 25 MG Tabs tablet Generic drug: empagliflozin TAKE 1 TABLET BY MOUTH ONCE DAILY BEFORE BREAKFAST FOR DIABETES   Lancets 33G Misc Dx DM E11.65. Check fasting blood sugar every morning.   levothyroxine 25 MCG tablet Commonly known as: SYNTHROID TAKE 1 TABLET BY MOUTH ONCE DAILY   meclizine 25 MG tablet Commonly known as: ANTIVERT Take 1 tablet (25  mg total) by mouth 3 (three) times daily as needed for dizziness.   metFORMIN 500 MG tablet Commonly known as: GLUCOPHAGE Take 1 tablet (500 mg total) by mouth 2 (two) times daily with a meal. For diabetes.   multivitamin capsule Take 1 capsule by mouth daily.   rosuvastatin 10 MG tablet Commonly known as: Crestor Take 1 tablet (10 mg total) by mouth daily.   True Metrix Blood Glucose Test test strip Generic drug: glucose blood Dx DM E11.65. Check fasting blood sugar every morning.        History  (reviewed): Past Medical History:  Diagnosis Date   Diabetes mellitus without complication (HCC)    GERD (gastroesophageal reflux disease)    H/O blood clots    in left leg after left arm surgery in 05/2006 ----- off coumadin   Morbid obesity ([place]) 11/17/2006   Qualifier: Diagnosis of  By: Madilyn Fireman MD, [place] 09/21/2007   Qualifier: Diagnosis of  By: Madilyn Fireman MD, [place]     Polymyalgia ([place]) 08/11/2016   Past Surgical History:  Procedure Laterality Date   CHOLECYSTECTOMY     CYSTECTOMY     left arm fracture     NECK SURGERY     cyst removal   right knee surgery     Family History  Problem Relation Age of Onset   Prostate cancer Other    Diabetes Other    Diabetes Brother    Social History   Socioeconomic History   Marital status: Single    Spouse name: Not on file   Number of children: 3   Years of education: 14   Highest education level: Associate degree: occupational, Hotel manager, or vocational program  Occupational History   Occupation: Health and safety inspector    Comment: [employer]  Tobacco Use   Smoking status: Former    Types: Cigarettes    Quit date: 03/25/1972    Years since quitting: 49.9   Smokeless tobacco: Never  Vaping Use   Vaping Use: Never used  Substance and Sexual Activity   Alcohol use: No   Drug use: No   Sexual activity: Never  Other Topics Concern   Not on file  Social History Narrative   Lives alone. Recently had neck surgery, she is not able to lift over 5 lbs; she can't look at the computer for a long time without her neck aching or headaches. She is not able to do much exercise due to that. She has two living children, and one passed away in 03/18/2019.   Social Determinants of Health   Financial Resource Strain: Low Risk  (03/12/2022)   Overall Financial Resource Strain (CARDIA)    Difficulty of Paying Living Expenses: Not hard at all  Food Insecurity: No Food Insecurity (03/12/2022)   Hunger Vital Sign     Worried About Running Out of Food in the Last Year: Never true    Ran Out of Food in the Last Year: Never true  Transportation Needs: No Transportation Needs (03/12/2022)   PRAPARE - Hydrologist (Medical): No    Lack of Transportation (Non-Medical): No  Physical Activity: Insufficiently Active (03/12/2022)   Exercise Vital Sign    Days of Exercise per Week: 7 days    Minutes of Exercise per Session: 10 min  Stress: No Stress Concern Present (03/12/2022)   [place]    Feeling of Stress : Not at all  Social Connections: Socially Isolated (03/12/2022)   Social Connection and Isolation Panel [NHANES]    Frequency of Communication with Friends and Family: More than three times a week    Frequency of Social Gatherings with Friends and Family: Once a week    Attends Religious Services: Never    Marine scientist or Organizations: No    Attends Archivist Meetings: Never    Marital Status: Divorced    Activities of Daily Living    03/12/2022    1:42 PM  In your present state of health, do you have any difficulty performing the following activities:  Hearing? 1  Comment hearing loss in both ears.  Vision? 0  Difficulty concentrating or making decisions? 0  Walking or climbing stairs? 1  Comment climbing stairs are difficult  Dressing or bathing? 0  Doing errands, shopping? 0  Preparing Food and eating ? N  Using the Toilet? N  In the past six months, have you accidently leaked urine? N  Do you have problems with loss of bowel control? N  Managing your Medications? N  Managing your Finances? N  Housekeeping or managing your Housekeeping? N    Patient Education/ Literacy How often do you need to have someone help you when you read instructions, pamphlets, or other written materials from your doctor or pharmacy?: 1 - Never What is the last grade level you completed in school?:  Certificate  Exercise Current Exercise Habits: Home exercise routine, Type of exercise: Other - see comments (PT exercises), Time (Minutes): 10, Frequency (Times/Week): 7, Weekly Exercise (Minutes/Week): 70, Intensity: Moderate, Exercise limited by: orthopedic condition(s)  Diet Patient reports consuming 1 meals a day and 1-2 snack(s) a day Patient reports that her primary diet is: Regular Patient reports that she does have regular access to food.   Depression Screen    03/12/2022    1:37 PM 10/08/2021   11:16 AM 08/12/2021   11:00 AM 06/04/2020   10:54 AM 10/30/2019    4:51 PM 04/04/2018   10:52 AM 03/13/2018   11:23 AM  PHQ 2/9 Scores  PHQ - 2 Score 0 0 0 0 0 0 1  PHQ- 9 Score    0 '7 9 13     '$ Fall Risk    03/12/2022    1:37 PM 10/28/2021    3:45 PM 10/08/2021   11:16 AM 08/12/2021   10:59 AM 06/04/2020   10:54 AM  Fall Risk   Falls in the past year? 0 1 0 0 0  Comment  foot got caught in the curtain     Number falls in past yr: 0 0 0 0 0  Injury with Fall? 0 0 0 0 0  Risk for fall due to : Orthopedic patient  No Fall Risks Impaired mobility No Fall Risks  Follow up Falls evaluation completed;Education provided Falls prevention discussed Falls evaluation completed Falls prevention discussed;Falls evaluation completed Falls evaluation completed     Objective:  Nicole Giles seemed alert and oriented and she participated appropriately during our telephone visit.  Blood Pressure Weight BMI  BP Readings from Last 3 Encounters:  12/01/21 (!) 107/55  10/08/21 (!) 142/90  08/12/21 130/63   Wt Readings from Last 3 Encounters:  01/07/22 215 lb (97.5 kg)  12/01/21 215 lb (97.5 kg)  08/12/21 214 lb (97.1 kg)   BMI Readings from Last 1 Encounters:  01/07/22 43.42 kg/m    *Unable to obtain current vital signs, weight, and BMI due  to telephone visit type  Hearing/Vision  Nicole Giles did not seem to have difficulty with hearing/understanding during the telephone conversation Reports that  she has had a formal eye exam by an eye care professional within the past year Reports that she has not had a formal hearing evaluation within the past year *Unable to fully assess hearing and vision during telephone visit type  Cognitive Function:    03/12/2022    1:44 PM 06/04/2020   11:05 AM 04/04/2018   10:53 AM  6CIT Screen  What Year? 0 points 0 points 0 points  What month? 0 points 0 points 0 points  What time? 0 points 0 points 0 points  Count back from 20 0 points 0 points 0 points  Months in reverse 0 points 2 points 0 points  Repeat phrase 0 points 2 points 4 points  Total Score 0 points 4 points 4 points   (Normal:0-7, Significant for Dysfunction: >8)  Normal Cognitive Function Screening: Yes   Immunization & Health Maintenance Record Immunization History  Administered Date(s) Administered   Moderna Sars-Covid-2 Vaccination 09/24/2019   Tdap 04/04/2018    Health Maintenance  Topic Date Due   MAMMOGRAM  05/01/2020   HEMOGLOBIN A1C  02/11/2022   COVID-19 Vaccine (2 - Moderna risk series) 03/28/2022 (Originally 10/22/2019)   Zoster Vaccines- Shingrix (1 of 2) 06/12/2022 (Originally 12/28/1964)   INFLUENZA VACCINE  08/08/2022 (Originally 12/08/2021)   Pneumonia Vaccine 71+ Years old (1 - PCV) 03/13/2023 (Originally 12/27/2012)   OPHTHALMOLOGY EXAM  05/22/2022   Diabetic kidney evaluation - Urine ACR  08/13/2022   FOOT EXAM  08/13/2022   Diabetic kidney evaluation - GFR measurement  01/09/2023   Medicare Annual Wellness (AWV)  03/13/2023   COLONOSCOPY (Pts 45-51yr Insurance coverage will need to be confirmed)  05/06/2023   TETANUS/TDAP  04/04/2028   DEXA SCAN  Completed   Hepatitis C Screening  Completed   HPV VACCINES  Aged Out       Assessment  This is a routine wellness examination for Nicole Giles  Health Maintenance: Due or Overdue Health Maintenance Due  Topic Date Due   MAMMOGRAM  05/01/2020   HEMOGLOBIN A1C  02/11/2022    Nicole Simmerdoes  not need a referral for Community Assistance: Care Management:   no Social Work:    no Prescription Assistance:  no Nutrition/Diabetes Education:  no   Plan:  Personalized Goals  Goals Addressed               This Visit's Progress     Patient Stated (pt-stated)        Patient stated that she would like to be able to walk longer periods of time. She finished physical therapy.        Personalized Health Maintenance & Screening Recommendations  Pneumococcal vaccine  Influenza vaccine Screening mammography Bone densitometry screening Shingles vaccine Hemoglobin A1c  Patient declined the vaccines at this time.  Lung Cancer Screening Recommended: no (Low Dose CT Chest recommended if Age 712-80years, 30 pack-year currently smoking OR have quit w/in past 15 years) Hepatitis C Screening recommended: no HIV Screening recommended: no  Advanced Directives: Written information was not prepared per patient's request.  Referrals & Orders Orders Placed This Encounter  Procedures   DEXAScan   Mammogram 3D SCREEN BREAST BILATERAL    Follow-up Plan Follow-up with BDonella Stade PA-C as planned Medicare wellness visit in one year.  Patient will access AVS on my chart.  I have personally reviewed and noted the following in the patient's chart:   Medical and social history Use of alcohol, tobacco or illicit drugs  Current medications and supplements Functional ability and status Nutritional status Physical activity Advanced directives List of other physicians Hospitalizations, surgeries, and ER visits in previous 12 months Vitals Screenings to include cognitive, depression, and falls Referrals and appointments  In addition, I have reviewed and discussed with Nicole Giles certain preventive protocols, quality metrics, and best practice recommendations. A written personalized care plan for preventive services as well as general preventive health recommendations is  available and can be mailed to the patient at her request.      Tinnie Gens, RN BSN  03/12/2022

## 2022-03-12 NOTE — Patient Instructions (Addendum)
[place] Maintenance Summary and Written Plan of Care  Ms. Brogden ,  Thank you for allowing me to perform your Medicare Annual Wellness Visit and for your ongoing commitment to your health.   Health Maintenance & Immunization History Health Maintenance  Topic Date Due  . MAMMOGRAM  05/01/2020  . HEMOGLOBIN A1C  02/11/2022  . COVID-19 Vaccine (2 - Moderna risk series) 03/28/2022 (Originally 10/22/2019)  . Zoster Vaccines- Shingrix (1 of 2) 06/12/2022 (Originally 12/28/1964)  . INFLUENZA VACCINE  08/08/2022 (Originally 12/08/2021)  . Pneumonia Vaccine 13+ Years old (1 - PCV) 03/13/2023 (Originally 12/27/2012)  . OPHTHALMOLOGY EXAM  05/22/2022  . Diabetic kidney evaluation - Urine ACR  08/13/2022  . FOOT EXAM  08/13/2022  . Diabetic kidney evaluation - GFR measurement  01/09/2023  . Medicare Annual Wellness (AWV)  03/13/2023  . COLONOSCOPY (Pts 45-17yr Insurance coverage will need to be confirmed)  05/06/2023  . TETANUS/TDAP  04/04/2028  . DEXA SCAN  Completed  . Hepatitis C Screening  Completed  . HPV VACCINES  Aged Out   Immunization History  Administered Date(s) Administered  . Moderna Sars-Covid-2 Vaccination 09/24/2019  . Tdap 04/04/2018    These are the patient goals that we discussed:  Goals Addressed              This Visit's Progress   .  Patient Stated (pt-stated)        Patient stated that she would like to be able to walk longer periods of time. She finished physical therapy.         This is a list of Health Maintenance Items that are overdue or due now: Health Maintenance Due  Topic Date Due  . MAMMOGRAM  05/01/2020  . HEMOGLOBIN A1C  02/11/2022   Pneumococcal vaccine  Influenza vaccine Screening mammography Bone densitometry screening Shingles vaccine Hemoglobin A1c  Patient declined the vaccines at this time.  Orders/Referrals Placed Today: Orders Placed This Encounter  Procedures  . DEXAScan    Standing Status:    Future    Standing Expiration Date:   03/13/2023    Scheduling Instructions:     Please call patient to schedule.    Order Specific Question:   Reason for exam:    Answer:   post menopausal    Order Specific Question:   Preferred imaging location?    Answer:   [facility] Mammogram 3D SCREEN BREAST BILATERAL    Standing Status:   Future    Standing Expiration Date:   03/13/2023    Scheduling Instructions:     Please call patient to schedule.    Order Specific Question:   Reason for Exam (SYMPTOM  OR DIAGNOSIS REQUIRED)    Answer:   breast cancer screening    Order Specific Question:   Preferred imaging location?    Answer:   [facility]   (Contact our referral department at [phone] you have not spoken with someone about your referral appointment within the next 5 days)    Follow-up Plan Follow-up with BDonella Stade PA-C as planned Medicare wellness visit in one year.  Patient will access AVS on my chart.      Health Maintenance, Female Adopting a healthy lifestyle and getting preventive care are important in promoting health and wellness. Ask your health care provider about: The right schedule for you to have regular tests and exams. Things you can do on your own to prevent diseases and keep yourself healthy.  What should I know about diet, weight, and exercise? Eat a healthy diet  Eat a diet that includes plenty of vegetables, fruits, low-fat dairy products, and lean protein. Do not eat a lot of foods that are high in solid fats, added sugars, or sodium. Maintain a healthy weight Body mass index (BMI) is used to identify weight problems. It estimates body fat based on height and weight. Your health care provider can help determine your BMI and help you achieve or maintain a healthy weight. Get regular exercise Get regular exercise. This is one of the most important things you can do for your health. Most adults should: Exercise for at  least 150 minutes each week. The exercise should increase your heart rate and make you sweat (moderate-intensity exercise). Do strengthening exercises at least twice a week. This is in addition to the moderate-intensity exercise. Spend less time sitting. Even light physical activity can be beneficial. Watch cholesterol and blood lipids Have your blood tested for lipids and cholesterol at 74 years of age, then have this test every 5 years. Have your cholesterol levels checked more often if: Your lipid or cholesterol levels are high. You are older than 74 years of age. You are at high risk for heart disease. What should I know about cancer screening? Depending on your health history and family history, you may need to have cancer screening at various ages. This may include screening for: Breast cancer. Cervical cancer. Colorectal cancer. Skin cancer. Lung cancer. What should I know about heart disease, diabetes, and high blood pressure? Blood pressure and heart disease High blood pressure causes heart disease and increases the risk of stroke. This is more likely to develop in people who have high blood pressure readings or are overweight. Have your blood pressure checked: Every 3-5 years if you are 39-43 years of age. Every year if you are 21 years old or older. Diabetes Have regular diabetes screenings. This checks your fasting blood sugar level. Have the screening done: Once every three years after age 83 if you are at a normal weight and have a low risk for diabetes. More often and at a younger age if you are overweight or have a high risk for diabetes. What should I know about preventing infection? Hepatitis B If you have a higher risk for hepatitis B, you should be screened for this virus. Talk with your health care provider to find out if you are at risk for hepatitis B infection. Hepatitis C Testing is recommended for: Everyone born from [birth year] through 1963. Anyone with known risk  factors for hepatitis C. Sexually transmitted infections (STIs) Get screened for STIs, including gonorrhea and chlamydia, if: You are sexually active and are younger than 74 years of age. You are older than 74 years of age and your health care provider tells you that you are at risk for this type of infection. Your sexual activity has changed since you were last screened, and you are at increased risk for chlamydia or gonorrhea. Ask your health care provider if you are at risk. Ask your health care provider about whether you are at high risk for HIV. Your health care provider may recommend a prescription medicine to help prevent HIV infection. If you choose to take medicine to prevent HIV, you should first get tested for HIV. You should then be tested every 3 months for as long as you are taking the medicine. Pregnancy If you are about to stop having your period (premenopausal) and you may  become pregnant, seek counseling before you get pregnant. Take 400 to 800 micrograms (mcg) of folic acid every day if you become pregnant. Ask for birth control (contraception) if you want to prevent pregnancy. Osteoporosis and menopause Osteoporosis is a disease in which the bones lose minerals and strength with aging. This can result in bone fractures. If you are 10 years old or older, or if you are at risk for osteoporosis and fractures, ask your health care provider if you should: Be screened for bone loss. Take a calcium or vitamin D supplement to lower your risk of fractures. Be given hormone replacement therapy (HRT) to treat symptoms of menopause. Follow these instructions at home: Alcohol use Do not drink alcohol if: Your health care provider tells you not to drink. You are pregnant, may be pregnant, or are planning to become pregnant. If you drink alcohol: Limit how much you have to: 0-1 drink a day. Know how much alcohol is in your drink. In the [place] one drink equals one 12 oz bottle of beer  (355 mL), one 5 oz glass of wine (148 mL), or one 1 oz glass of hard liquor (44 mL). Lifestyle Do not use any products that contain nicotine or tobacco. These products include cigarettes, chewing tobacco, and vaping devices, such as e-cigarettes. If you need help quitting, ask your health care provider. Do not use street drugs. Do not share needles. Ask your health care provider for help if you need support or information about quitting drugs. General instructions Schedule regular health, dental, and eye exams. Stay current with your vaccines. Tell your health care provider if: You often feel depressed. You have ever been abused or do not feel safe at home. Summary Adopting a healthy lifestyle and getting preventive care are important in promoting health and wellness. Follow your health care provider's instructions about healthy diet, exercising, and getting tested or screened for diseases. Follow your health care provider's instructions on monitoring your cholesterol and blood pressure. This information is not intended to replace advice given to you by your health care provider. Make sure you discuss any questions you have with your health care provider. Document Revised: 09/15/2020 Document Reviewed: 09/15/2020 Elsevier Patient Education  [place].

## 2022-03-22 ENCOUNTER — Ambulatory Visit: Payer: Medicare PPO | Admitting: Physician Assistant

## 2022-03-22 DIAGNOSIS — E1165 Type 2 diabetes mellitus with hyperglycemia: Secondary | ICD-10-CM

## 2022-03-24 ENCOUNTER — Ambulatory Visit (INDEPENDENT_AMBULATORY_CARE_PROVIDER_SITE_OTHER): Payer: Medicare PPO | Admitting: Physician Assistant

## 2022-03-24 VITALS — BP 148/76 | HR 75 | Ht 59.0 in | Wt 218.0 lb

## 2022-03-24 DIAGNOSIS — E039 Hypothyroidism, unspecified: Secondary | ICD-10-CM

## 2022-03-24 DIAGNOSIS — E785 Hyperlipidemia, unspecified: Secondary | ICD-10-CM | POA: Diagnosis not present

## 2022-03-24 DIAGNOSIS — E1165 Type 2 diabetes mellitus with hyperglycemia: Secondary | ICD-10-CM | POA: Diagnosis not present

## 2022-03-24 DIAGNOSIS — K2100 Gastro-esophageal reflux disease with esophagitis, without bleeding: Secondary | ICD-10-CM | POA: Diagnosis not present

## 2022-03-24 LAB — POCT GLYCOSYLATED HEMOGLOBIN (HGB A1C): Hemoglobin A1C: 8.3 % — AB (ref 4.0–5.6)

## 2022-03-24 MED ORDER — GLIPIZIDE 5 MG PO TABS: 5.0000 mg | ORAL_TABLET | Freq: Two times a day (BID) | ORAL | 2 refills | Status: DC

## 2022-03-24 MED ORDER — TRUE METRIX BLOOD GLUCOSE TEST VI STRP: ORAL_STRIP | 99 refills | Status: DC

## 2022-03-24 MED ORDER — EMPAGLIFLOZIN 25 MG PO TABS: ORAL_TABLET | ORAL | 0 refills | Status: DC

## 2022-03-24 MED ORDER — PANTOPRAZOLE SODIUM 40 MG PO TBEC: 40.0000 mg | DELAYED_RELEASE_TABLET | Freq: Two times a day (BID) | ORAL | 1 refills | Status: DC

## 2022-03-24 MED ORDER — ROSUVASTATIN CALCIUM 10 MG PO TABS: 10.0000 mg | ORAL_TABLET | Freq: Every day | ORAL | 1 refills | Status: DC

## 2022-03-24 NOTE — Progress Notes (Signed)
Established Patient Office Visit  Subjective   Patient ID: Nicole Giles, female    DOB: 06-14-1945  Age: 76 y.o. MRN: [id number]  Chief Complaint  Patient presents with   Follow-up   Diabetes    HPI Pt is a 76 yo obese female with T2DM, HTN, HLD, OSA, GERD, polymyalgia who presents to the clinic for 3 month follow up.   Pt is checking her sugars in the morning and running 150s. No open sores or wounds. No CP, palpitations, headaches or vision changes. She is not exercising and not eating like she should.  She is taking jardiance and glipizide. She cannot tolerate GLP-1's.   She is sleeping better with CPAP.   Her reflux seems to be worsening. On nexium and not helping. No melena or hematochezia. She is burping a lot.    Patient Active Problem List   Diagnosis Date Noted   Morbidly obese ([place]) 03/29/2022   Trouble in sleeping 10/08/2021   Uncontrolled type 2 diabetes mellitus with hyperglycemia ([place]) 10/08/2021   Injury of great toenail 10/08/2021   Age spots 10/08/2021   Cherry angioma 10/08/2021   Nail avulsion of toe, initial encounter 10/08/2021   Hypothyroidism 10/24/2020   Tinnitus of both ears 07/22/2020   History of TIA (transient ischemic attack) 04/21/2020   Cervical stenosis of spinal canal 04/21/2020   Hereditary hemochromatosis ([place]) 04/02/2019   Nausea 04/02/2019   Controlled type 2 diabetes mellitus with hyperglycemia, without long-term current use of insulin ([place]) 12/15/2018   Baker's cyst of knee, right 11/22/2018   History of pulmonary embolus (PE) 07/13/2018   Fatty liver 06/16/2018   Fibroadenoma of right breast 05/12/2018   Diverticulosis 05/12/2018   Internal hemorrhoids 05/12/2018   Colon polyp 05/12/2018   Post-traumatic osteoarthritis of right knee 11/30/2017   Pain of left calf 01/16/2017   Weakness 09/19/2016   Elevated C-reactive protein (CRP) 09/19/2016   Acute pain of left shoulder 09/19/2016   Arthritis of shoulder region, left,  degenerative 09/17/2016   No energy 08/19/2016   Myalgia 08/19/2016   Cough 08/19/2016   Polymyalgia ([place]) 08/11/2016   Peripheral edema 12/31/2015   Elevated blood pressure 12/31/2015   Primary osteoarthritis of right knee 08/15/2014   Elevated fasting glucose 01/16/2014   Hyperlipidemia LDL goal <70 12/17/2013   Arthralgia 12/17/2013   Body aches 12/17/2013   INJURY OTHER AND UNSPECIFIED FINGER 10/04/2008   FREQUENCY, URINARY 09/09/2008   FATTY LIVER DISEASE 09/04/2008   UNSPECIFIED DISORDER OF KIDNEY AND URETER 09/02/2008   TRANSAMINASES, SERUM, ELEVATED 08/27/2008   GERD 08/22/2008   DIZZINESS 08/22/2008   Dyspnea 08/22/2008   CERVICALGIA 06/13/2008   Pain in limb 11/13/2007   OSA on CPAP 09/21/2007   HYPERLIPIDEMIA 08/28/2007   DEPENDENT EDEMA, LEGS 08/25/2007   CHEST PAIN 06/30/2007   ARTHRITIS, KNEES, BILATERAL 06/01/2007   VERTIGO, BENIGN PAROXYSMAL POSITION 02/15/2007   FATIGUE 12/08/2006   Morbid obesity ([place]) 11/17/2006   HEADACHE, TENSION 10/12/2006   Past Medical History:  Diagnosis Date   Diabetes mellitus without complication ([place])    GERD (gastroesophageal reflux disease)    H/O blood clots    in left leg after left arm surgery in 05/2006 ----- off coumadin   Morbid obesity ([place]) 11/17/2006   Qualifier: Diagnosis of  By: Madilyn Fireman MD, [place] 09/21/2007   Qualifier: Diagnosis of  By: Madilyn Fireman MD, Catherine     Polymyalgia ([place]) 08/11/2016   Family History  Problem Relation  Age of Onset   Prostate cancer Other    Diabetes Other    Diabetes Brother    Allergies  Allergen Reactions   Codeine    Lipitor [Atorvastatin Calcium]     myaglia   Meperidine Hcl       ROS   See HPI.  Objective:     BP (!) 148/76   Pulse 75   Ht '4\' 11"'$  (1.499 m)   Wt 218 lb (98.9 kg)   SpO2 94%   BMI 44.03 kg/m  BP Readings from Last 3 Encounters:  03/24/22 (!) 148/76  12/01/21 (!) 107/55  10/08/21 (!) 142/90   Wt Readings  from Last 3 Encounters:  03/24/22 218 lb (98.9 kg)  01/07/22 215 lb (97.5 kg)  12/01/21 215 lb (97.5 kg)   .[name] Results for orders placed or performed in visit on 03/24/22  POCT glycosylated hemoglobin (Hb A1C)  Result Value Ref Range   Hemoglobin A1C 8.3 (A) 4.0 - 5.6 %   HbA1c POC (<> result, manual entry)     HbA1c, POC (prediabetic range)     HbA1c, POC (controlled diabetic range)         Physical Exam Constitutional:      Appearance: Normal appearance. She is obese.  HENT:     Head: Normocephalic.     Right Ear: Tympanic membrane normal.     Left Ear: Tympanic membrane normal.  Cardiovascular:     Rate and Rhythm: Normal rate and regular rhythm.     Pulses: Normal pulses.     Heart sounds: Normal heart sounds.  Pulmonary:     Effort: Pulmonary effort is normal.     Breath sounds: Normal breath sounds.  Musculoskeletal:     Right lower leg: No edema.     Left lower leg: No edema.  Neurological:     General: No focal deficit present.     Mental Status: She is alert and oriented to person, place, and time.  Psychiatric:        Mood and Affect: Mood normal.      The 10-year ASCVD risk score (Arnett DK, et al., 2019) is: 33.5%    Assessment & Plan:  Marland KitchenMarland KitchenMarlaine was seen today for follow-up and diabetes.  Diagnoses and all orders for this visit:  Uncontrolled type 2 diabetes mellitus with hyperglycemia ([place]) -     POCT glycosylated hemoglobin (Hb A1C) -     glipiZIDE (GLUCOTROL) 5 MG tablet; Take 1 tablet (5 mg total) by mouth 2 (two) times daily before a meal. For diabetes. -     glucose blood (TRUE METRIX BLOOD GLUCOSE TEST) test strip; Dx DM E11.65. Check fasting blood sugar every morning. -     empagliflozin (JARDIANCE) 25 MG TABS tablet; TAKE 1 TABLET BY MOUTH ONCE DAILY BEFORE BREAKFAST FOR DIABETES  Morbidly obese ([facility])  Hypothyroidism, unspecified type  Gastroesophageal reflux disease with esophagitis without hemorrhage -     pantoprazole (PROTONIX) 40  MG tablet; Take 1 tablet (40 mg total) by mouth 2 (two) times daily before a meal. For reflux.  Dyslipidemia, goal LDL below 70 -     rosuvastatin (CRESTOR) 10 MG tablet; Take 1 tablet (10 mg total) by mouth daily. For cholesterol.   A1C not to goal.  Discussed medication options but patient agrees to try to work on diet and exercise first.  Continue medications Continue crestor Stop nexium, start protonix bid and then day. Follow up with [facility] that she is  already established with.  If no improvement then needs to consider TZD since pt cannot tolerate GLP-1.  BP not quite to goal but has not taken medication yet.  Refilled testing supply kits.  Follow up in 3 months.    Iran Planas, PA-C

## 2022-03-24 NOTE — Patient Instructions (Signed)
[facility] for burping and GI issues Protonix twice a day for 2-3 weeks then daily Stop nexium

## 2022-03-29 ENCOUNTER — Encounter: Payer: Self-pay | Admitting: Physician Assistant

## 2022-04-09 ENCOUNTER — Telehealth: Payer: Self-pay

## 2022-04-09 NOTE — Patient Outreach (Signed)
  Care Coordination   04/09/2022 Name: Nicole Giles MRN: [id number] DOB: 07/16/1945   Care Coordination Outreach Attempts:  An unsuccessful telephone outreach was attempted today to offer the patient information about available care coordination services as a benefit of their health plan.   Follow Up Plan:  Additional outreach attempts will be made to offer the patient care coordination information and services.   Encounter Outcome:  No Answer   Care Coordination Interventions:  No, not indicated    Thea Silversmith, RN, MSN, BSN, [place] Coordinator [phone]

## 2022-04-16 ENCOUNTER — Telehealth: Payer: Self-pay | Admitting: *Deleted

## 2022-04-16 NOTE — Progress Notes (Signed)
  Care Coordination Note  04/16/2022 Name: Nicole Giles MRN: [id number] DOB: [birth year]  Nicole Giles is a 76 y.o. year old female who is a primary care patient of Lavada Mesi and is actively engaged with the care management team. I reached out to [name] by phone today to assist with re-scheduling a follow up visit with the RN Case Manager  Follow up plan: Patient declines further follow up and engagement by the care management team. Appropriate care team members and provider have been notified via electronic communication.   Julian Hy, [place] Direct Dial: [phone]

## 2022-04-20 ENCOUNTER — Telehealth: Payer: Self-pay

## 2022-04-20 NOTE — Patient Outreach (Signed)
  Care Coordination   Follow Up Visit Note   04/20/2022 Name: Nicole Giles MRN: [id number] DOB: Dec 29, 1945  Nicole Giles is a 76 y.o. year old female who sees [place], Royetta Car, [place] for primary care. I  received message from [place] that patient declines further participation in program.  What matters to the patients health and wellness today?  Per message from  care guide, Patient declines further participation in program. Case closed    Goals Addressed             This Visit's Progress    COMPLETED: Diabetes-request education regarding diet       Goals completed due to patient declines further participation in program. Care Coordination Interventions: Medications reviewed and encouraged to continue to take as prescribed Reviewed goals patient discussed with Diabetes/Nutrition educator Discussed upcoming Holidays, encouraged patient to monitor portion sizes and avoid concentrated sweets, saturated fats and transfats Encouraged patient to schedule follow up with provider Discussed target blood sugar range 80-130 before meals and <180 about 2 hours after meals or per provider recommendations. Encouraged to discuss with provider.      SDOH assessments and interventions completed:  No  Care Coordination Interventions:  No, not indicated   Follow up plan: No further intervention required.   Encounter Outcome:  Pt. Visit Completed   Thea Silversmith, RN, MSN, BSN, [place] Coordinator [phone]

## 2022-04-23 DIAGNOSIS — Z86711 Personal history of pulmonary embolism: Secondary | ICD-10-CM | POA: Diagnosis not present

## 2022-04-23 DIAGNOSIS — K760 Fatty (change of) liver, not elsewhere classified: Secondary | ICD-10-CM | POA: Diagnosis not present

## 2022-04-23 DIAGNOSIS — R7401 Elevation of levels of liver transaminase levels: Secondary | ICD-10-CM | POA: Diagnosis not present

## 2022-04-23 DIAGNOSIS — D7589 Other specified diseases of blood and blood-forming organs: Secondary | ICD-10-CM | POA: Diagnosis not present

## 2022-04-23 DIAGNOSIS — D6859 Other primary thrombophilia: Secondary | ICD-10-CM | POA: Diagnosis not present

## 2022-04-23 DIAGNOSIS — D751 Secondary polycythemia: Secondary | ICD-10-CM | POA: Diagnosis not present

## 2022-04-23 DIAGNOSIS — D72820 Lymphocytosis (symptomatic): Secondary | ICD-10-CM | POA: Diagnosis not present

## 2022-05-19 ENCOUNTER — Other Ambulatory Visit: Payer: Medicare PPO

## 2022-05-19 ENCOUNTER — Ambulatory Visit: Payer: Medicare PPO

## 2022-06-02 ENCOUNTER — Ambulatory Visit (INDEPENDENT_AMBULATORY_CARE_PROVIDER_SITE_OTHER): Payer: Medicare PPO

## 2022-06-02 DIAGNOSIS — Z0000 Encounter for general adult medical examination without abnormal findings: Secondary | ICD-10-CM

## 2022-06-02 DIAGNOSIS — Z780 Asymptomatic menopausal state: Secondary | ICD-10-CM | POA: Diagnosis not present

## 2022-06-02 DIAGNOSIS — M85852 Other specified disorders of bone density and structure, left thigh: Secondary | ICD-10-CM | POA: Diagnosis not present

## 2022-06-02 DIAGNOSIS — Z1231 Encounter for screening mammogram for malignant neoplasm of breast: Secondary | ICD-10-CM

## 2022-06-04 NOTE — Progress Notes (Signed)
Normal mammogram. Follow up in 1 year.

## 2022-06-04 NOTE — Progress Notes (Signed)
Bone density has improved some since last check from -1.4 to -1.2. still in osteopenic range but keep up the good work of vitamin D and calcium.

## 2022-06-11 DIAGNOSIS — Z23 Encounter for immunization: Secondary | ICD-10-CM | POA: Diagnosis not present

## 2022-06-11 DIAGNOSIS — S0003XA Contusion of scalp, initial encounter: Secondary | ICD-10-CM | POA: Diagnosis not present

## 2022-06-11 DIAGNOSIS — M542 Cervicalgia: Secondary | ICD-10-CM | POA: Diagnosis not present

## 2022-06-11 DIAGNOSIS — M25531 Pain in right wrist: Secondary | ICD-10-CM | POA: Diagnosis not present

## 2022-06-11 DIAGNOSIS — S022XXA Fracture of nasal bones, initial encounter for closed fracture: Secondary | ICD-10-CM | POA: Diagnosis not present

## 2022-06-11 DIAGNOSIS — M79601 Pain in right arm: Secondary | ICD-10-CM | POA: Diagnosis not present

## 2022-06-11 DIAGNOSIS — W228XXA Striking against or struck by other objects, initial encounter: Secondary | ICD-10-CM | POA: Diagnosis not present

## 2022-06-11 DIAGNOSIS — S022XXB Fracture of nasal bones, initial encounter for open fracture: Secondary | ICD-10-CM | POA: Diagnosis not present

## 2022-06-11 DIAGNOSIS — S0121XA Laceration without foreign body of nose, initial encounter: Secondary | ICD-10-CM | POA: Diagnosis not present

## 2022-06-11 DIAGNOSIS — W19XXXA Unspecified fall, initial encounter: Secondary | ICD-10-CM | POA: Diagnosis not present

## 2022-06-11 DIAGNOSIS — M7989 Other specified soft tissue disorders: Secondary | ICD-10-CM | POA: Diagnosis not present

## 2022-06-11 DIAGNOSIS — Y999 Unspecified external cause status: Secondary | ICD-10-CM | POA: Diagnosis not present

## 2022-06-15 ENCOUNTER — Ambulatory Visit: Payer: Medicare PPO | Admitting: Physician Assistant

## 2022-06-16 ENCOUNTER — Ambulatory Visit (INDEPENDENT_AMBULATORY_CARE_PROVIDER_SITE_OTHER): Payer: Medicare PPO | Admitting: Physician Assistant

## 2022-06-16 ENCOUNTER — Encounter: Payer: Self-pay | Admitting: Physician Assistant

## 2022-06-16 VITALS — BP 134/70 | HR 80 | Ht 59.0 in | Wt 213.1 lb

## 2022-06-16 DIAGNOSIS — W19XXXD Unspecified fall, subsequent encounter: Secondary | ICD-10-CM | POA: Diagnosis not present

## 2022-06-16 DIAGNOSIS — Z09 Encounter for follow-up examination after completed treatment for conditions other than malignant neoplasm: Secondary | ICD-10-CM

## 2022-06-16 DIAGNOSIS — E1165 Type 2 diabetes mellitus with hyperglycemia: Secondary | ICD-10-CM | POA: Diagnosis not present

## 2022-06-16 DIAGNOSIS — S022XXD Fracture of nasal bones, subsequent encounter for fracture with routine healing: Secondary | ICD-10-CM

## 2022-06-16 DIAGNOSIS — S022XXA Fracture of nasal bones, initial encounter for closed fracture: Secondary | ICD-10-CM | POA: Insufficient documentation

## 2022-06-16 DIAGNOSIS — W19XXXA Unspecified fall, initial encounter: Secondary | ICD-10-CM | POA: Insufficient documentation

## 2022-06-16 MED ORDER — EMPAGLIFLOZIN 25 MG PO TABS: ORAL_TABLET | ORAL | 0 refills | Status: DC

## 2022-06-16 MED ORDER — SITAGLIPTIN PHOSPHATE 100 MG PO TABS: 100.0000 mg | ORAL_TABLET | Freq: Every day | ORAL | 2 refills | Status: DC

## 2022-06-16 MED ORDER — GLIPIZIDE 10 MG PO TABS: 10.0000 mg | ORAL_TABLET | Freq: Two times a day (BID) | ORAL | 2 refills | Status: DC

## 2022-06-16 NOTE — Progress Notes (Signed)
Established Patient Office Visit  Subjective   Patient ID: Nicole Giles, female    DOB: November 06, 1945  Age: 77 y.o. MRN: [id number]  Chief Complaint  Patient presents with   Hospitalization Follow-up    HPI Pt is a 77 yo female who presents to the clinic for hospital follow up after fall on 06/11/2022. She tripped over her cat and landed on her head. No LOC. She did have laceration repair and CT of head and spine showed a nasal arch fracture. She is seeing ENT tomorrow for follow up. She was given keflex with concern for open fracture.   Too soon for A1C. On jardiance and glipizide. Not checking her sugars regularly.  Last A1C was 8.3. denies any hypoglycemic events.   .. Active Ambulatory Problems    Diagnosis Date Noted   HYPERLIPIDEMIA 08/28/2007   Morbid obesity ([place]) 11/17/2006   HEADACHE, TENSION 10/12/2006   OSA on CPAP 09/21/2007   VERTIGO, BENIGN PAROXYSMAL POSITION 02/15/2007   GERD 08/22/2008   FATTY LIVER DISEASE 09/04/2008   UNSPECIFIED DISORDER OF KIDNEY AND URETER 09/02/2008   ARTHRITIS, KNEES, BILATERAL 06/01/2007   CERVICALGIA 06/13/2008   Pain in limb 11/13/2007   DIZZINESS 08/22/2008   FATIGUE 12/08/2006   DEPENDENT EDEMA, LEGS 08/25/2007   Dyspnea 08/22/2008   CHEST PAIN 06/30/2007   FREQUENCY, URINARY 09/09/2008   TRANSAMINASES, SERUM, ELEVATED 08/27/2008   INJURY OTHER AND UNSPECIFIED FINGER 10/04/2008   Hyperlipidemia LDL goal <70 12/17/2013   Arthralgia 12/17/2013   Body aches 12/17/2013   Elevated fasting glucose 01/16/2014   Primary osteoarthritis of right knee 08/15/2014   Peripheral edema 12/31/2015   Elevated blood pressure 12/31/2015   Polymyalgia ([place]) 08/11/2016   No energy 08/19/2016   Myalgia 08/19/2016   Cough 08/19/2016   Arthritis of shoulder region, left, degenerative 09/17/2016   Weakness 09/19/2016   Elevated C-reactive protein (CRP) 09/19/2016   Acute pain of left shoulder 09/19/2016   Pain of left calf 01/16/2017    Post-traumatic osteoarthritis of right knee 11/30/2017   Fibroadenoma of right breast 05/12/2018   Diverticulosis 05/12/2018   Internal hemorrhoids 05/12/2018   Colon polyp 05/12/2018   Controlled type 2 diabetes mellitus with hyperglycemia, without long-term current use of insulin ([place]) 12/15/2018   Hereditary hemochromatosis ([place]) 04/02/2019   Nausea 04/02/2019   History of pulmonary embolus (PE) 07/13/2018   Fatty liver 06/16/2018   Baker's cyst of knee, right 11/22/2018   History of TIA (transient ischemic attack) 04/21/2020   Cervical stenosis of spinal canal 04/21/2020   Tinnitus of both ears 07/22/2020   Hypothyroidism 10/24/2020   Trouble in sleeping 10/08/2021   Uncontrolled type 2 diabetes mellitus with hyperglycemia ([place]) 10/08/2021   Injury of great toenail 10/08/2021   Age spots 10/08/2021   Cherry angioma 10/08/2021   Nail avulsion of toe, initial encounter 10/08/2021   Morbidly obese ([place]) 03/29/2022   Fall 06/16/2022   Closed fracture of nasal bones 06/16/2022   Resolved Ambulatory Problems    Diagnosis Date Noted   PULMONARY EMBOLISM 10/12/2006   INFLUENZA DUE TO ID NOVEL H1N1 INFLUENZA VIRUS 03/20/2008   UTI 11/29/2008   Pre-diabetes 01/23/2014   Pyelonephritis 08/19/2016   Past Medical History:  Diagnosis Date   Diabetes mellitus without complication ([place])    GERD (gastroesophageal reflux disease)    H/O blood clots    OBSTRUCTIVE SLEEP APNEA 09/21/2007     ROS See HPI.    Objective:     BP 134/70 (BP Location:  Left Wrist, Patient Position: Sitting, Cuff Size: Small)   Pulse 80   Ht 4' 11"$  (1.499 m)   Wt 213 lb 1.3 oz (96.7 kg)   SpO2 97%   BMI 43.04 kg/m  BP Readings from Last 3 Encounters:  06/16/22 134/70  03/24/22 (!) 148/76  12/01/21 (!) 107/55   Wt Readings from Last 3 Encounters:  06/16/22 213 lb 1.3 oz (96.7 kg)  03/24/22 218 lb (98.9 kg)  01/07/22 215 lb (97.5 kg)      Physical Exam Constitutional:      Appearance:  Normal appearance. She is obese.  HENT:     Head:     Comments: Hematoma of right forehead bruising along forehead, eyes, nasal bridge.  Cardiovascular:     Rate and Rhythm: Normal rate.  Pulmonary:     Effort: Pulmonary effort is normal.  Neurological:     General: No focal deficit present.     Mental Status: She is alert and oriented to person, place, and time.  Psychiatric:        Mood and Affect: Mood normal.     The 10-year ASCVD risk score (Arnett DK, et al., 2019) is: 25.8%    Assessment & Plan:  Marland KitchenMarland KitchenNieve was seen today for hospitalization follow-up.  Diagnoses and all orders for this visit:  Hospital discharge follow-up  Uncontrolled type 2 diabetes mellitus with hyperglycemia ([place]) -     glipiZIDE (GLUCOTROL) 10 MG tablet; Take 1 tablet (10 mg total) by mouth 2 (two) times daily before a meal. -     sitaGLIPtin (JANUVIA) 100 MG tablet; Take 1 tablet (100 mg total) by mouth daily. -     empagliflozin (JARDIANCE) 25 MG TABS tablet; TAKE 1 TABLET BY MOUTH ONCE DAILY BEFORE BREAKFAST FOR DIABETES  Open fracture of nasal bone with routine healing, subsequent encounter  Fall, subsequent encounter   Vitals good and patient is stable Follow up with ENT regarding nasal fracture Finish keflex Too soon for A1C Not able to tolerate GLP-1 start januvia.  Continue glipizide 10m twice a day and jardiance Follow up in 3 months Needs eye exam    JIran Planas PA-C

## 2022-06-16 NOTE — Patient Instructions (Signed)
Start [place] Increase glipizide to '10mg'$  twice a day Continue jardiance

## 2022-06-17 DIAGNOSIS — S022XXD Fracture of nasal bones, subsequent encounter for fracture with routine healing: Secondary | ICD-10-CM | POA: Diagnosis not present

## 2022-06-28 ENCOUNTER — Encounter: Payer: Self-pay | Admitting: Physician Assistant

## 2022-08-03 ENCOUNTER — Telehealth: Payer: Self-pay | Admitting: Physician Assistant

## 2022-08-03 ENCOUNTER — Ambulatory Visit (INDEPENDENT_AMBULATORY_CARE_PROVIDER_SITE_OTHER): Payer: Medicare PPO | Admitting: Physician Assistant

## 2022-08-03 ENCOUNTER — Ambulatory Visit (INDEPENDENT_AMBULATORY_CARE_PROVIDER_SITE_OTHER): Payer: Medicare PPO

## 2022-08-03 VITALS — BP 129/63 | HR 84 | Ht 59.0 in | Wt 213.0 lb

## 2022-08-03 DIAGNOSIS — R1011 Right upper quadrant pain: Secondary | ICD-10-CM | POA: Diagnosis not present

## 2022-08-03 DIAGNOSIS — M25561 Pain in right knee: Secondary | ICD-10-CM | POA: Diagnosis not present

## 2022-08-03 DIAGNOSIS — M1711 Unilateral primary osteoarthritis, right knee: Secondary | ICD-10-CM

## 2022-08-03 MED ORDER — DICLOFENAC SODIUM 75 MG PO TBEC: 75.0000 mg | DELAYED_RELEASE_TABLET | Freq: Two times a day (BID) | ORAL | 2 refills | Status: DC

## 2022-08-03 NOTE — Patient Instructions (Signed)
Quadriceps Strain Rehab Ask your health care provider which exercises are safe for you. Do exercises exactly as told by your health care provider and adjust them as directed. It is normal to feel mild stretching, pulling, tightness, or discomfort as you do these exercises. Stop right away if you feel sudden pain or your pain gets worse. Do not begin these exercises until told by your health care provider. Stretching and range-of-motion exercises These exercises warm up your muscles and joints and improve the movement and flexibility of your thigh. These exercises can also help to relieve stiffness or swelling. Heel slides  Lie on your back with both legs straight. If this causes back discomfort, bend the knee of your healthy leg so your foot is flat on the floor. Slowly slide your left / right heel back toward your buttocks. Stop when you feel a gentle stretch in the front of your knee or thigh (quadriceps). Hold this position for __________ seconds. Slowly slide your left / right heel back to the starting position. Repeat __________ times. Complete this exercise __________ times a day. Quadriceps stretch, prone  Lie on your abdomen on a firm surface, such as a bed or padded floor (prone position). Bend your left / right knee and hold your ankle. If you cannot reach your ankle or pant leg, loop a belt around your foot and grab the belt instead. Gently pull your heel toward your buttocks. Your knee should not slide out to the side. You should feel a stretch in the front of your thigh and knee (quadriceps). Hold this position for __________ seconds. Repeat __________ times. Complete this exercise __________ times a day. Strengthening exercises These exercises build strength and endurance in your thigh. Endurance is the ability to use your muscles for a long time, even after your muscles get tired. Straight leg raises, supine  This exercise stretches the muscles in front of your thigh (quadriceps)  and the muscles that move your hips (hip flexors). Quality counts! Watch for signs that the quadriceps muscle is working to ensure that you are strengthening the correct muscles and not cheating by using healthier muscles. Lie on your back (supine position) with your left / right leg extended and your other knee bent. Tense the muscles in the front of your left / right thigh. You should see your kneecap slide up or see increased dimpling just above the knee. Tighten these muscles even more and raise your leg 4-6 inches (10-15 cm) off the floor. Hold this position for __________ seconds. Keep the thigh muscles tense as you lower your leg. Relax the muscles slowly and completely after each repetition. Repeat __________ times. Complete this exercise __________ times a day. Leg raises, prone This exercise strengthens the muscles that move the hips (hip extensors). Lie on your abdomen on a bed or a firm surface (prone position). Place a pillow under your hips. Bend your left / right knee so your foot is straight up in the air. Squeeze your buttocks muscles and lift your left / right thigh off the bed. Do not let your back arch. Hold this position for __________ seconds. Slowly return to the starting position. Let your muscles relax completely before doing another repetition. Repeat __________ times. Complete this exercise __________ times a day. Wall sits Follow the directions for form closely. Knee pain can occur if your feet or knees are not placed properly. Lean your back against a smooth wall or door, and walk your feet out 18-24 inches (46-61 cm)  from it. Place your feet hip-width apart. Slowly slide down the wall or door until your knees bend __________ degrees. Keep your weight back and over your heels, not over your toes. Keep your thighs straight or pointing slightly outward. Hold this position for __________ seconds. Use your thigh and buttocks muscles to push yourself back up to a  standing position. Keep your weight through your heels while you do this. Rest for __________ seconds after each repetition. Repeat __________ times. Complete this exercise __________ times a day. This information is not intended to replace advice given to you by your health care provider. Make sure you discuss any questions you have with your health care provider. Document Revised: 10/13/2020 Document Reviewed: 10/13/2020 Elsevier Patient Education  [place]. Quadriceps Strain  A quadriceps strain is an injury to the muscles or tendons on the front of the thigh. The quadriceps muscles are used in straightening the knee and bending the hip. A strain occurs when the muscle is overstretched or overloaded. There are three types of strains: Grade 1 is a mild strain. It involves a stretching or minor tearing of your muscle fibers or tendons. You should have little, if any, trouble using your thigh. Grade 2 is a moderate strain. It involves a partial tearing of your muscle fibers or tendons. You will have pain and some loss of strength in your thigh. Grade 3 is a severe strain. It involves a complete tearing of your muscle fibers or tendons. It causes severe pain and loss of strength in your thigh. Recovery will take a few weeks or longer, depending on how bad your strain is. What are the causes? This injury is caused by overextending the muscles in the thigh. What increases the risk? The following factors may make you more likely to develop this injury: Participating in: Activities that involve jumping, sprinting, or sudden stopping or twisting. Contact sports, such as football or soccer. Having a previous injury to your thigh or knee. Having poor thigh strength and flexibility. Not warming up properly before activity. Having one leg that is much stronger than the other. Exercising to the point of exhaustion. What are the signs or symptoms? Symptoms of this condition include: Sudden,  severe pain in your thigh. Pain and tenderness over your quadriceps muscles. The pain gets worse when you use these muscles. Muscle spasm in your thigh. Swelling in your thigh. Bruising. Having trouble with tasks that involve using your quadriceps muscles, such as walking. A crackling sound when the tendon is moved or touched. How is this diagnosed? This condition is diagnosed based on: A physical exam. Your medical history. Imaging tests, such as: X-rays. Ultrasound. MRI. How is this treated? Treatment for this condition may include: Resting your leg and avoiding activities that cause pain. Taking medicine to help reduce pain and inflammation. Applying ice to the area to relieve swelling and inflammation. Elevating the leg to reduce or prevent swelling. Applying a compression wrap to the muscle. Using crutches until you can walk without pain. Working with a physical therapist on exercises to improve movement and strength in your thigh. In rare cases, surgery may be needed. Follow these instructions at home: Managing pain, stiffness, and swelling  If directed, put ice on the injured area. To do this: Put ice in a plastic bag. Place a towel between your skin and the bag. Leave the ice on for 20 minutes, 2-3 times a day. Remove the ice if your skin turns bright red. This is very  important. If you cannot feel pain, heat, or cold, you have a greater risk of damage to the area. Raise (elevate) the injured area above the level of your heart while you are sitting or lying down. Activity Do not use the injured leg to support your body weight until your health care provider says that you can. Use crutches as told by your health care provider. Do exercises as told by your health care provider. Return to your normal activities as told by your health care provider. Ask your health care provider what activities are safe for you. General instructions Take over-the-counter and prescription  medicines only as told by your health care provider. Use compression wraps to apply pressure as told by your health care provider. Keep all follow-up visits. This is important. How is this prevented? Warm up and stretch before being active. Cool down and stretch after being active. Give your body time to rest between periods of activity. Maintain physical fitness, including: Strength. Flexibility. Be safe and responsible while being active. This will help you avoid falls. Contact a health care provider if: Your pain, bruising, or tenderness gets worse, even with treatment. Your leg becomes weaker. Summary A quadriceps strain is an injury to the muscles or tendons on the front of the thigh. This injury is caused by overextending the muscles in the thigh. Treatment may include rest, ice, medicines, and physical therapy. In rare cases, surgery may be needed. This information is not intended to replace advice given to you by your health care provider. Make sure you discuss any questions you have with your health care provider. Document Revised: 10/13/2020 Document Reviewed: 10/13/2020 Elsevier Patient Education  [place].

## 2022-08-03 NOTE — Telephone Encounter (Signed)
Pt called. She is following up on new med for knee pain that was supposed to have been sent to her pharmacy. Pharmacy on file is up to date.

## 2022-08-03 NOTE — Progress Notes (Signed)
Acute Office Visit  Subjective:     Patient ID: Nicole Giles, female    DOB: [birth year], 77 y.o.   MRN: [id number]  Chief Complaint  Patient presents with   Knee Pain    HPI Patient is in today for   .[name] Active Ambulatory Problems    Diagnosis Date Noted   HYPERLIPIDEMIA 08/28/2007   Morbid obesity ([place]) 11/17/2006   HEADACHE, TENSION 10/12/2006   OSA on CPAP 09/21/2007   VERTIGO, BENIGN PAROXYSMAL POSITION 02/15/2007   GERD 08/22/2008   FATTY LIVER DISEASE 09/04/2008   UNSPECIFIED DISORDER OF KIDNEY AND URETER 09/02/2008   ARTHRITIS, KNEES, BILATERAL 06/01/2007   CERVICALGIA 06/13/2008   Pain in limb 11/13/2007   DIZZINESS 08/22/2008   FATIGUE 12/08/2006   DEPENDENT EDEMA, LEGS 08/25/2007   Dyspnea 08/22/2008   CHEST PAIN 06/30/2007   FREQUENCY, URINARY 09/09/2008   TRANSAMINASES, SERUM, ELEVATED 08/27/2008   INJURY OTHER AND UNSPECIFIED FINGER 10/04/2008   Hyperlipidemia LDL goal <70 12/17/2013   Arthralgia 12/17/2013   Body aches 12/17/2013   Elevated fasting glucose 01/16/2014   Primary osteoarthritis of right knee 08/15/2014   Peripheral edema 12/31/2015   Elevated blood pressure 12/31/2015   Polymyalgia 08/11/2016   No energy 08/19/2016   Myalgia 08/19/2016   Cough 08/19/2016   Arthritis of shoulder region, left, degenerative 09/17/2016   Weakness 09/19/2016   Elevated C-reactive protein (CRP) 09/19/2016   Acute pain of left shoulder 09/19/2016   Pain of left calf 01/16/2017   Post-traumatic osteoarthritis of right knee 11/30/2017   Fibroadenoma of right breast 05/12/2018   Diverticulosis 05/12/2018   Internal hemorrhoids 05/12/2018   Colon polyp 05/12/2018   Controlled type 2 diabetes mellitus with hyperglycemia, without long-term current use of insulin 12/15/2018   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   Nausea 04/02/2019   History of pulmonary embolus (PE) 07/13/2018   Fatty liver 06/16/2018   Baker's cyst of knee, right 11/22/2018   History  of TIA (transient ischemic attack) 04/21/2020   Cervical stenosis of spinal canal 04/21/2020   Tinnitus of both ears 07/22/2020   Hypothyroidism 10/24/2020   Trouble in sleeping 10/08/2021   Uncontrolled type 2 diabetes mellitus with hyperglycemia 10/08/2021   Injury of great toenail 10/08/2021   Age spots 10/08/2021   Cherry angioma 10/08/2021   Nail avulsion of toe, initial encounter 10/08/2021   Morbidly obese 03/29/2022   Fall 06/16/2022   Closed fracture of nasal bones 06/16/2022   Tricompartment osteoarthritis of right knee 08/09/2022   Right upper quadrant abdominal pain 08/10/2022   Acute pain of right knee 08/10/2022   Resolved Ambulatory Problems    Diagnosis Date Noted   PULMONARY EMBOLISM 10/12/2006   INFLUENZA DUE TO ID NOVEL H1N1 INFLUENZA VIRUS 03/20/2008   UTI 11/29/2008   Pre-diabetes 01/23/2014   Pyelonephritis 08/19/2016   Past Medical History:  Diagnosis Date   Diabetes mellitus without complication (HCC)    GERD (gastroesophageal reflux disease)    H/O blood clots    OBSTRUCTIVE SLEEP APNEA 09/21/2007     ROS  See HPI.     Objective:    BP 129/63   Pulse 84   Ht 4\' 11"  (1.499 m)   Wt 213 lb (96.6 kg)   SpO2 97%   BMI 43.02 kg/m  BP Readings from Last 3 Encounters:  08/03/22 129/63  06/16/22 134/70  03/24/22 (!) 148/76   Wt Readings from Last 3 Encounters:  08/03/22 213 lb (96.6 kg)  06/16/22 213 lb 1.3 oz (  96.7 kg)  03/24/22 218 lb (98.9 kg)      Physical Exam Constitutional:      Appearance: Normal appearance. She is obese.  HENT:     Head: Normocephalic.  Cardiovascular:     Rate and Rhythm: Normal rate.  Pulmonary:     Effort: Pulmonary effort is normal.  Musculoskeletal:     Comments: Right knee swollen and tender to palpation over medial knee and quadriceps.  No redness or abrasion Strength 5/5  Neurological:     General: No focal deficit present.     Mental Status: She is alert and oriented to person, place, and  time.  Psychiatric:        Mood and Affect: Mood normal.          Assessment & Plan:  Marland KitchenMarland KitchenOriane was seen today for knee pain.  Diagnoses and all orders for this visit:  Acute pain of right knee -     DG Knee Complete 4 Views Right; Future -     diclofenac (VOLTAREN) 75 MG EC tablet; Take 1 tablet (75 mg total) by mouth 2 (two) times daily.  Primary osteoarthritis of right knee -     diclofenac (VOLTAREN) 75 MG EC tablet; Take 1 tablet (75 mg total) by mouth 2 (two) times daily.  Right upper quadrant abdominal pain -     US Abdomen Complete; Future  Morbidly obese ([place])   Xray confirms severe OA of right knee Use diclofenac Follow up with Dr. Darene Lamer for injections Needs to lose weight-does not tolerate GLP-1s Exercises given to strengthen quads  RUQ seems more muscular or diaphram related when twisting a certain way Will get U/S of abdomen Warm compresses and stretching Make sure staying hydrated Follow up as needed or if symptoms worsen or change  Iran Planas, PA-C

## 2022-08-03 NOTE — Telephone Encounter (Signed)
I don't see anything sent.

## 2022-08-09 ENCOUNTER — Telehealth: Payer: Self-pay

## 2022-08-09 ENCOUNTER — Encounter: Payer: Self-pay | Admitting: Physician Assistant

## 2022-08-09 DIAGNOSIS — M1711 Unilateral primary osteoarthritis, right knee: Secondary | ICD-10-CM | POA: Insufficient documentation

## 2022-08-09 NOTE — Telephone Encounter (Signed)
Patient informed and will stop Voltaren- will use tylenol until seen by Dr. Mel Almond tomorrow.

## 2022-08-09 NOTE — Progress Notes (Signed)
Severe arthritis in right knee causing pain with likely some soft tissue inflammation and strain. No acute findings.   Are you ok with scheduling a possible steroid injection with Dr. Darene Lamer?

## 2022-08-09 NOTE — Telephone Encounter (Signed)
Please have her stop the Voltaren as well as any NSAID including ibuprofen and Aleve.  These can absolutely cause gastritis and irritate the lining of the stomach.  If she is able to take Tylenol she can switch to that and then keep her follow-up with Dr. Mel Almond tomorrow.

## 2022-08-09 NOTE — Telephone Encounter (Signed)
Forwarded message to Dr. Madilyn Fireman covering Nicole Giles . Spoke with patient during results phone call.  She is scheduled for appt tomorrow with Dr. Mel Almond.  For rib pain- when asked if this was in relation to a cough  She states nol that she was told she had enlarged liver in past and  Causing diaphragm to push upward.   She is also  having possible s/e to voltaren tablets give at last appt She states this is causing abdominal pain and stools that are darker in color. From dark brown to almost black.

## 2022-08-10 ENCOUNTER — Encounter: Payer: Self-pay | Admitting: Family Medicine

## 2022-08-10 ENCOUNTER — Ambulatory Visit (INDEPENDENT_AMBULATORY_CARE_PROVIDER_SITE_OTHER): Payer: Medicare PPO | Admitting: Family Medicine

## 2022-08-10 ENCOUNTER — Encounter: Payer: Self-pay | Admitting: Physician Assistant

## 2022-08-10 VITALS — BP 138/71 | HR 79 | Ht 59.0 in | Wt 217.0 lb

## 2022-08-10 DIAGNOSIS — R0781 Pleurodynia: Secondary | ICD-10-CM | POA: Insufficient documentation

## 2022-08-10 DIAGNOSIS — M25561 Pain in right knee: Secondary | ICD-10-CM | POA: Insufficient documentation

## 2022-08-10 DIAGNOSIS — R1011 Right upper quadrant pain: Secondary | ICD-10-CM | POA: Insufficient documentation

## 2022-08-10 DIAGNOSIS — R748 Abnormal levels of other serum enzymes: Secondary | ICD-10-CM | POA: Diagnosis not present

## 2022-08-10 MED ORDER — CYCLOBENZAPRINE HCL 5 MG PO TABS: 5.0000 mg | ORAL_TABLET | Freq: Every day | ORAL | 0 refills | Status: DC

## 2022-08-10 NOTE — Assessment & Plan Note (Signed)
-   continue voltaren gel  - can do a trial of flexeril with stretching  - has Abdominal US tomorrow, will follow  - will order CMP to check liver function as well as electrolytes

## 2022-08-10 NOTE — Progress Notes (Signed)
   Acute Office Visit  Subjective:     Patient ID: Nicole Giles, female    DOB: [birth year], 77 y.o.   MRN: [id number]  Chief Complaint  Patient presents with   Rib Injury    HPI Patient is in today for rib pain. She saw PCP Friday and had pain on left side. There was thought liver was enlarged and causing pain. If she reaches for something she gets a "jabbing pain." Notes soreness under her diaphragm. Only thing that helps is not moving. She feels like she has a sharp object in her ribs. No recent heavy lifting. In February she had a fall where she broke her nose.   Review of Systems  Constitutional:  Negative for chills and fever.  Respiratory:  Negative for cough and shortness of breath.   Cardiovascular:  Negative for chest pain.  Neurological:  Negative for headaches.        Objective:    BP (!) 163/67 (BP Location: Left Wrist, Patient Position: Sitting, Cuff Size: Normal)   Pulse 79   Ht 4\' 11"  (1.499 m)   Wt 217 lb (98.4 kg)   SpO2 95%   BMI 43.83 kg/m    Physical Exam Vitals and nursing note reviewed.  Constitutional:      General: She is not in acute distress.    Appearance: Normal appearance.  HENT:     Head: Normocephalic and atraumatic.     Right Ear: External ear normal.     Left Ear: External ear normal.     Nose: Nose normal.  Eyes:     Conjunctiva/sclera: Conjunctivae normal.  Cardiovascular:     Rate and Rhythm: Normal rate and regular rhythm.  Pulmonary:     Effort: Pulmonary effort is normal.     Breath sounds: Normal breath sounds.  Neurological:     General: No focal deficit present.     Mental Status: She is alert and oriented to person, place, and time.  Psychiatric:        Mood and Affect: Mood normal.        Behavior: Behavior normal.        Thought Content: Thought content normal.        Judgment: Judgment normal.     No results found for any visits on 08/10/22.      Assessment & Plan:   Problem List Items Addressed This  Visit       Other   Rib pain - Primary    - continue voltaren gel  - can do a trial of flexeril with stretching  - has Abdominal US tomorrow, will follow  - will order CMP to check liver function as well as electrolytes        No orders of the defined types were placed in this encounter.     Owens Loffler, DO

## 2022-08-11 ENCOUNTER — Ambulatory Visit (INDEPENDENT_AMBULATORY_CARE_PROVIDER_SITE_OTHER): Payer: Medicare PPO

## 2022-08-11 DIAGNOSIS — R1011 Right upper quadrant pain: Secondary | ICD-10-CM | POA: Diagnosis not present

## 2022-08-13 LAB — COMPLETE METABOLIC PANEL WITH GFR
AG Ratio: 1.6 (calc) (ref 1.0–2.5)
ALT: 50 U/L — ABNORMAL HIGH (ref 6–29)
AST: 55 U/L — ABNORMAL HIGH (ref 10–35)
Albumin: 4.2 g/dL (ref 3.6–5.1)
Alkaline phosphatase (APISO): 194 U/L — ABNORMAL HIGH (ref 37–153)
BUN/Creatinine Ratio: 20 (calc) (ref 6–22)
BUN: 10 mg/dL (ref 7–25)
CO2: 23 mmol/L (ref 20–32)
Calcium: 9.1 mg/dL (ref 8.6–10.4)
Chloride: 105 mmol/L (ref 98–110)
Creat: 0.49 mg/dL — ABNORMAL LOW (ref 0.60–1.00)
Globulin: 2.7 g/dL (calc) (ref 1.9–3.7)
Glucose, Bld: 145 mg/dL — ABNORMAL HIGH (ref 65–99)
Potassium: 4.1 mmol/L (ref 3.5–5.3)
Sodium: 139 mmol/L (ref 135–146)
Total Bilirubin: 0.8 mg/dL (ref 0.2–1.2)
Total Protein: 6.9 g/dL (ref 6.1–8.1)
eGFR: 99 mL/min/{1.73_m2} (ref >=60)

## 2022-08-13 LAB — GAMMA GT: GGT: 165 U/L — ABNORMAL HIGH (ref 3–65)

## 2022-08-15 ENCOUNTER — Other Ambulatory Visit: Payer: Self-pay | Admitting: Family Medicine

## 2022-08-15 DIAGNOSIS — R748 Abnormal levels of other serum enzymes: Secondary | ICD-10-CM

## 2022-08-15 DIAGNOSIS — R1011 Right upper quadrant pain: Secondary | ICD-10-CM

## 2022-08-16 NOTE — Progress Notes (Unsigned)
     Established patient visit   Patient: Nicole Giles   DOB: 18-Apr-1946   77 y.o. Female  MRN: [id number] Visit Date: 08/17/2022  Today's healthcare provider: Charlton Amor, DO   No chief complaint on file.   SUBJECTIVE   No chief complaint on file.  HPI    Review of Systems     No outpatient medications have been marked as taking for the 08/17/22 encounter (Appointment) with Charlton Amor, DO.    OBJECTIVE    There were no vitals taken for this visit.  Physical Exam   {Show previous labs (optional):23736}    ASSESSMENT & PLAN    Problem List Items Addressed This Visit   None   No follow-ups on file.      No orders of the defined types were placed in this encounter.   No orders of the defined types were placed in this encounter.    Charlton Amor, DO  [facility] at [facility] [phone] (phone) [phone] (fax)  [facility]

## 2022-08-17 ENCOUNTER — Encounter: Payer: Self-pay | Admitting: Family Medicine

## 2022-08-17 ENCOUNTER — Ambulatory Visit (INDEPENDENT_AMBULATORY_CARE_PROVIDER_SITE_OTHER): Payer: Medicare PPO | Admitting: Family Medicine

## 2022-08-17 ENCOUNTER — Ambulatory Visit (INDEPENDENT_AMBULATORY_CARE_PROVIDER_SITE_OTHER): Payer: Medicare PPO

## 2022-08-17 VITALS — BP 159/79 | HR 74 | Temp 98.7°F | Ht 59.0 in | Wt 216.0 lb

## 2022-08-17 DIAGNOSIS — R1011 Right upper quadrant pain: Secondary | ICD-10-CM | POA: Diagnosis not present

## 2022-08-17 DIAGNOSIS — R748 Abnormal levels of other serum enzymes: Secondary | ICD-10-CM | POA: Insufficient documentation

## 2022-08-17 DIAGNOSIS — R7989 Other specified abnormal findings of blood chemistry: Secondary | ICD-10-CM | POA: Diagnosis not present

## 2022-08-17 NOTE — Assessment & Plan Note (Signed)
-   pt is scheduled for [place] elastography which she will complete today - will also obtain mitochondrial antibody to look for [facility]

## 2022-08-17 NOTE — Progress Notes (Signed)
Fatty liver noted on ultrasound. Liver does have some appearance of cirrhosis. We need to get [place] elastography to further evaluate. Are you ok with order?

## 2022-08-19 ENCOUNTER — Other Ambulatory Visit: Payer: Self-pay | Admitting: Family Medicine

## 2022-08-19 DIAGNOSIS — R1011 Right upper quadrant pain: Secondary | ICD-10-CM

## 2022-08-23 DIAGNOSIS — R7989 Other specified abnormal findings of blood chemistry: Secondary | ICD-10-CM | POA: Diagnosis not present

## 2022-08-23 DIAGNOSIS — R1011 Right upper quadrant pain: Secondary | ICD-10-CM | POA: Diagnosis not present

## 2022-08-23 DIAGNOSIS — K7400 Hepatic fibrosis, unspecified: Secondary | ICD-10-CM | POA: Diagnosis not present

## 2022-08-23 DIAGNOSIS — R109 Unspecified abdominal pain: Secondary | ICD-10-CM | POA: Diagnosis not present

## 2022-08-23 DIAGNOSIS — R7401 Elevation of levels of liver transaminase levels: Secondary | ICD-10-CM | POA: Diagnosis not present

## 2022-08-23 DIAGNOSIS — R945 Abnormal results of liver function studies: Secondary | ICD-10-CM | POA: Diagnosis not present

## 2022-08-23 LAB — MITOCHONDRIAL ANTIBODIES: Mitochondrial M2 Ab, IgG: <=20 U (ref <=20.0)

## 2022-08-25 DIAGNOSIS — G4710 Hypersomnia, unspecified: Secondary | ICD-10-CM | POA: Diagnosis not present

## 2022-08-25 DIAGNOSIS — Z6841 Body Mass Index (BMI) 40.0 and over, adult: Secondary | ICD-10-CM | POA: Diagnosis not present

## 2022-08-31 ENCOUNTER — Encounter: Payer: Self-pay | Admitting: Family Medicine

## 2022-08-31 ENCOUNTER — Telehealth: Payer: Self-pay | Admitting: Physician Assistant

## 2022-08-31 ENCOUNTER — Ambulatory Visit (INDEPENDENT_AMBULATORY_CARE_PROVIDER_SITE_OTHER): Payer: Medicare PPO | Admitting: Family Medicine

## 2022-08-31 VITALS — BP 129/74 | HR 78 | Ht 59.0 in | Wt 213.1 lb

## 2022-08-31 DIAGNOSIS — E039 Hypothyroidism, unspecified: Secondary | ICD-10-CM

## 2022-08-31 DIAGNOSIS — E1165 Type 2 diabetes mellitus with hyperglycemia: Secondary | ICD-10-CM

## 2022-08-31 MED ORDER — TIRZEPATIDE 2.5 MG/0.5ML ~~LOC~~ SOAJ
2.5000 mg | SUBCUTANEOUS | 0 refills | Status: DC
Start: 2022-08-31 — End: 2023-01-28

## 2022-08-31 NOTE — Telephone Encounter (Signed)
Pt states that Dr. Tamera Punt has accepted her as her patient. Were you aware of that and are you ok with the change?

## 2022-08-31 NOTE — Progress Notes (Signed)
Established patient visit   Patient: Nicole Giles   DOB: [birth year]   77 y.o. Female  MRN: [id number] Visit Date: 08/31/2022  Today's healthcare provider: Charlton Amor, DO   Chief Complaint  Patient presents with   Establish Care    Pt states that she no longer wants to see [facility]. She states that she was told by Dr. Tamera Punt that it was okay to switch Providers.    Obesity    She would like to request options.    Medication Problem    Pt would like to discuss px. She is asking if they are all necessary.     SUBJECTIVE    Chief Complaint  Patient presents with   Establish Care    [name]. She states that she was told by Dr. Tamera Punt that it was okay to switch Providers.    Obesity    She would like to request options.    Medication Problem    Pt would like to discuss px. She is asking if they are all necessary.    HPI  T2DM - she was previously on ozempic and then when she went back on it it made her sick.  - she is on jardiance  (she paid $406) - glipizide  BID   Hypothyroidism - levothyroxine daily - has had difficulty losing weight  Review of Systems  Constitutional:  Negative for activity change, fatigue and fever.  Respiratory:  Negative for cough and shortness of breath.   Cardiovascular:  Negative for chest pain.  Gastrointestinal:  Negative for abdominal pain.  Genitourinary:  Negative for difficulty urinating.       Current Meds  Medication Sig   diclofenac Sodium (VOLTAREN) 1 % GEL Apply 4 g topically 4 (four) times daily. To affected joint.   glipiZIDE (GLUCOTROL) 10 MG tablet Take 1 tablet (10 mg total) by mouth 2 (two) times daily before a meal.   glucose blood (TRUE METRIX BLOOD GLUCOSE TEST) test strip Dx DM E11.65. Check fasting blood sugar every morning.   JARDIANCE 25 MG TABS tablet Take 25 mg by mouth every morning.   Lancets 33G MISC Dx DM E11.65. Check fasting blood sugar every morning.    levothyroxine (SYNTHROID) 25 MCG tablet TAKE 1 TABLET BY MOUTH ONCE DAILY   Multiple Vitamin (MULTIVITAMIN) capsule Take 1 capsule by mouth daily.   pantoprazole (PROTONIX) 40 MG tablet Take 1 tablet (40 mg total) by mouth 2 (two) times daily before a meal. For reflux.   tirzepatide [facility]) 2.5 MG/0.5ML Pen Inject 2.5 mg into the skin once a week.    OBJECTIVE    BP 129/74   Pulse 78   Ht  (1.499 m)   Wt 213 lb 1.9 oz (96.7 kg)   SpO2 95%   BMI 43.05 kg/m   Physical Exam Vitals and nursing note reviewed.  Constitutional:      General: She is not in acute distress.    Appearance: Normal appearance.  HENT:     Head: Normocephalic and atraumatic.     Right Ear: External ear normal.     Left Ear: External ear normal.     Nose: Nose normal.  Eyes:     Conjunctiva/sclera: Conjunctivae normal.  Cardiovascular:     Rate and Rhythm: Normal rate and regular rhythm.  Pulmonary:     Effort: Pulmonary effort is normal.     Breath sounds: Normal  breath sounds.  Neurological:     General: No focal deficit present.     Mental Status: She is alert and oriented to person, place, and time.  Psychiatric:        Mood and Affect: Mood normal.        Behavior: Behavior normal.        Thought Content: Thought content normal.        Judgment: Judgment normal.        ASSESSMENT & PLAN    Problem List Items Addressed This Visit       Endocrine   Hypothyroidism    - have ordered tsh to see if there is any correlation with weight loss difficulty      Relevant Orders   TSH + free T4   Uncontrolled type 2 diabetes mellitus with hyperglycemia - Primary    - will go ahead and get A1c  - have ordered mounjaro as we need better sugar control and weight loss      Relevant Medications   JARDIANCE 25 MG TABS tablet   tirzepatide (MOUNJARO) 2.5 MG/0.5ML Pen   Other Relevant Orders   HgB A1c    Return in about 4 weeks (around 09/28/2022).      Meds ordered this encounter   Medications   tirzepatide (MOUNJARO) 2.5 MG/0.5ML Pen    Sig: Inject 2.5 mg into the skin once a week.    Dispense:  2 mL    Refill:  0    Orders Placed This Encounter  Procedures   TSH + free T4   HgB A1c     Charlton Amor, DO  [facility] at [facility] [phone] (phone) [phone] (fax)  [facility]

## 2022-08-31 NOTE — Assessment & Plan Note (Signed)
-   have ordered tsh to see if there is any correlation with weight loss difficulty

## 2022-08-31 NOTE — Telephone Encounter (Signed)
I was not but I am ok with that to be changed.

## 2022-08-31 NOTE — Assessment & Plan Note (Signed)
-   will go ahead and get A1c  - have ordered mounjaro as we need better sugar control and weight loss

## 2022-09-01 LAB — TSH+FREE T4: TSH W/REFLEX TO FT4: 3.46 mIU/L (ref 0.40–4.50)

## 2022-09-01 LAB — HEMOGLOBIN A1C
Hgb A1c MFr Bld: 8 % of total Hgb — ABNORMAL HIGH (ref <5.7)
Mean Plasma Glucose: 183 mg/dL
eAG (mmol/L): 10.1 mmol/L

## 2022-09-02 ENCOUNTER — Other Ambulatory Visit: Payer: Medicare PPO | Admitting: Pharmacist

## 2022-09-02 NOTE — Progress Notes (Signed)
09/02/2022 Name: Nicole Giles MRN: [id number] DOB: February 16, 1946  Nicole Giles is a 77 y.o. year old female who presented for a telephone visit.   They were referred to the pharmacist by a quality report for assistance in managing  quality metric: med adherence diabetes (MAD) .    Subjective:  Care Team: Primary Care Provider: Charlton Amor, DO   Medication Access/Adherence  Current Pharmacy:  [facility] [address] [place] - [address] [place] [address] Phone: [phone] Fax: [phone]  [id number] - [place], [place] - [address] [place] [address] Phone: [phone] Fax: [phone]   Patient reports affordability concerns with their medications: Yes - jardiance previously $45 month, now $406 for 3 month supply Patient reports access/transportation concerns to their pharmacy: No  Patient reports adherence concerns with their medications:  No     Diabetes:  Current medications: jardiance  daily, glipizide  BID, new RX for mounjaro 2.5mg  weekly is pending (anticipate high cost during donut hole)    Objective:  Lab Results  Component Value Date   HGBA1C 8.0 (H) 08/31/2022    Lab Results  Component Value Date   CREATININE 0.49 (L) 08/10/2022   BUN 10 08/10/2022   NA 139 08/10/2022   K 4.1 08/10/2022   CL 105 08/10/2022   CO2 23 08/10/2022    Lab Results  Component Value Date   CHOL 214 (H) 10/08/2021   HDL 43 (L) 10/08/2021   LDLCALC 134 (H) 10/08/2021   TRIG 229 (H) 10/08/2021   CHOLHDL 5.0 (H) 10/08/2021    Medications Reviewed Today     Reviewed by Yolanda Manges, CMA (Certified Medical Assistant) on 08/31/22 at 1515  Med List Status: <None>   Medication Order Taking? Sig Documenting Provider Last Dose Status Informant  cyclobenzaprine (FLEXERIL) 5 MG tablet [id number]  Take 1 tablet (5 mg total) by mouth at bedtime. For muscle spam as  needed Morey Hummingbird S, DO  Active   diclofenac Sodium (VOLTAREN) 1 % GEL [id number]  Apply 4 g topically 4 (four) times daily. To affected joint. Jomarie Longs, PA-C  Active            Med Note Earlene Plater, Donn Pierini M   Wed Oct 28, 2021  3:41 PM) Reports uses as needed  glipiZIDE (GLUCOTROL) 10 MG tablet [id number]  Take 1 tablet (10 mg total) by mouth 2 (two) times daily before a meal. Breeback, Jade L, PA-C  Active   glucose blood (TRUE METRIX BLOOD GLUCOSE TEST) test strip [id number]  Dx DM E11.65. Check fasting blood sugar every morning. Jomarie Longs, PA-C  Active   Lancets 33G MISC [id number]  Dx DM E11.65. Check fasting blood sugar every morning. Jomarie Longs, PA-C  Active   levothyroxine (SYNTHROID) 25 MCG tablet [id number]  TAKE 1 TABLET BY MOUTH ONCE DAILY Breeback, Jade L, PA-C  Active   Multiple Vitamin (MULTIVITAMIN) capsule [id number]  Take 1 capsule by mouth daily. [provider]  Active   pantoprazole (PROTONIX) 40 MG tablet [id number]  Take 1 tablet (40 mg total) by mouth 2 (two) times daily before a meal. For reflux. Jomarie Longs, PA-C  Active               Assessment/Plan:   Diabetes: - Currently uncontrolled, but with plans in place for medication adjustments - Recommend to continue current regimen, will support patient through cost barriers  -  Meets financial criteria for jardiance patient assistance program through [facility]. Will collaborate with provider, CPhT, and patient to pursue assistance.     Follow Up Plan: 2-4 weeks  Lynnda Shields, PharmD, BCPS Clinical Pharmacist [facility]

## 2022-09-06 ENCOUNTER — Telehealth: Payer: Self-pay

## 2022-09-06 NOTE — Telephone Encounter (Signed)
-----   Message from Gabriel Carina, [facility] sent at 09/02/2022  6:07 PM EDT ----- Hi, Can you please start application for Jardiance 25mg  daily through [facility]?  Thank you, Thurston Hole

## 2022-09-06 NOTE — Telephone Encounter (Signed)
Pt portion of application was sent to pt's address.

## 2022-09-14 ENCOUNTER — Ambulatory Visit: Payer: Medicare PPO | Admitting: Physician Assistant

## 2022-09-16 DIAGNOSIS — Z9049 Acquired absence of other specified parts of digestive tract: Secondary | ICD-10-CM | POA: Diagnosis not present

## 2022-09-16 DIAGNOSIS — K760 Fatty (change of) liver, not elsewhere classified: Secondary | ICD-10-CM | POA: Diagnosis not present

## 2022-09-16 DIAGNOSIS — N281 Cyst of kidney, acquired: Secondary | ICD-10-CM | POA: Diagnosis not present

## 2022-09-16 DIAGNOSIS — K449 Diaphragmatic hernia without obstruction or gangrene: Secondary | ICD-10-CM | POA: Diagnosis not present

## 2022-09-16 DIAGNOSIS — R162 Hepatomegaly with splenomegaly, not elsewhere classified: Secondary | ICD-10-CM | POA: Diagnosis not present

## 2022-09-28 ENCOUNTER — Ambulatory Visit (INDEPENDENT_AMBULATORY_CARE_PROVIDER_SITE_OTHER): Payer: Medicare PPO | Admitting: Family Medicine

## 2022-09-28 ENCOUNTER — Encounter: Payer: Self-pay | Admitting: Family Medicine

## 2022-09-28 VITALS — BP 110/72 | HR 82 | Ht 59.0 in | Wt 213.8 lb

## 2022-09-28 DIAGNOSIS — E1165 Type 2 diabetes mellitus with hyperglycemia: Secondary | ICD-10-CM | POA: Diagnosis not present

## 2022-09-28 DIAGNOSIS — I447 Left bundle-branch block, unspecified: Secondary | ICD-10-CM

## 2022-09-28 DIAGNOSIS — G4733 Obstructive sleep apnea (adult) (pediatric): Secondary | ICD-10-CM | POA: Diagnosis not present

## 2022-09-28 DIAGNOSIS — L84 Corns and callosities: Secondary | ICD-10-CM | POA: Diagnosis not present

## 2022-09-28 DIAGNOSIS — H9110 Presbycusis, unspecified ear: Secondary | ICD-10-CM | POA: Diagnosis not present

## 2022-09-28 DIAGNOSIS — M85649 Other cyst of bone, unspecified hand: Secondary | ICD-10-CM

## 2022-09-28 DIAGNOSIS — R0602 Shortness of breath: Secondary | ICD-10-CM

## 2022-09-28 MED ORDER — TIRZEPATIDE 7.5 MG/0.5ML ~~LOC~~ SOAJ
7.5000 mg | SUBCUTANEOUS | 0 refills | Status: DC
Start: 2022-09-28 — End: 2023-01-28

## 2022-09-28 MED ORDER — TIRZEPATIDE 5 MG/0.5ML ~~LOC~~ SOAJ
5.0000 mg | SUBCUTANEOUS | 0 refills | Status: DC
Start: 2022-09-28 — End: 2023-01-28

## 2022-09-28 MED ORDER — TIRZEPATIDE 10 MG/0.5ML ~~LOC~~ SOAJ
10.0000 mg | SUBCUTANEOUS | 0 refills | Status: DC
Start: 2022-09-28 — End: 2023-01-28

## 2022-09-28 NOTE — Progress Notes (Signed)
Established patient visit   Patient: Nicole Giles   DOB: 1945-06-22   77 y.o. Female  MRN: [id number] Visit Date: 09/28/2022  Today's healthcare provider: Charlton Amor, DO   Chief Complaint  Patient presents with   Follow-up    Pat last A1C  8.0 on 08/31/22- no side effects form Mounjaro.     SUBJECTIVE    Chief Complaint  Patient presents with   Follow-up    Pat last A1C  8.0 on 08/31/22- no side effects form Mounjaro.    HPI  Pt presents for follow up on T2DM. She is currently trying to get patient assistance with jardiance 25mg .   Pt says she is having some shortness of breath. She is also having bilateral edema.   Review of Systems  Constitutional:  Negative for activity change, fatigue and fever.  Respiratory:  Negative for cough and shortness of breath.   Cardiovascular:  Negative for chest pain.  Gastrointestinal:  Negative for abdominal pain.  Genitourinary:  Negative for difficulty urinating.       Current Meds  Medication Sig   cyclobenzaprine (FLEXERIL) 5 MG tablet Take 1 tablet (5 mg total) by mouth at bedtime. For muscle spam as needed   diclofenac Sodium (VOLTAREN) 1 % GEL Apply 4 g topically 4 (four) times daily. To affected joint.   glipiZIDE (GLUCOTROL) 10 MG tablet Take 1 tablet (10 mg total) by mouth 2 (two) times daily before a meal.   glucose blood (TRUE METRIX BLOOD GLUCOSE TEST) test strip Dx DM E11.65. Check fasting blood sugar every morning.   JARDIANCE 25 MG TABS tablet Take 25 mg by mouth every morning.   Lancets 33G MISC Dx DM E11.65. Check fasting blood sugar every morning.   levothyroxine (SYNTHROID) 25 MCG tablet TAKE 1 TABLET BY MOUTH ONCE DAILY   Multiple Vitamin (MULTIVITAMIN) capsule Take 1 capsule by mouth daily.   pantoprazole (PROTONIX) 40 MG tablet Take 1 tablet (40 mg total) by mouth 2 (two) times daily before a meal. For reflux.   tirzepatide (MOUNJARO) 10 MG/0.5ML Pen Inject 10 mg into the skin once a week.   tirzepatide  [facility]) 2.5 MG/0.5ML Pen Inject 2.5 mg into the skin once a week.   tirzepatide [facility]) 5 MG/0.5ML Pen Inject 5 mg into the skin once a week.   tirzepatide (MOUNJARO) 7.5 MG/0.5ML Pen Inject 7.5 mg into the skin once a week.    OBJECTIVE    BP 110/72   Pulse 82   Ht 4\' 11"  (1.499 m)   Wt 213 lb 12 oz (97 kg)   SpO2 96%   BMI 43.17 kg/m   Physical Exam Vitals and nursing note reviewed.  Constitutional:      General: She is not in acute distress.    Appearance: Normal appearance.  HENT:     Head: Normocephalic and atraumatic.     Right Ear: External ear normal.     Left Ear: External ear normal.     Nose: Nose normal.  Eyes:     Conjunctiva/sclera: Conjunctivae normal.  Cardiovascular:     Rate and Rhythm: Normal rate and regular rhythm.  Pulmonary:     Effort: Pulmonary effort is normal.     Breath sounds: Normal breath sounds.  Neurological:     General: No focal deficit present.     Mental Status: She is alert and oriented to person, place, and time.  Psychiatric:        Mood and  Affect: Mood normal.        Behavior: Behavior normal.        Thought Content: Thought content normal.        Judgment: Judgment normal.          ASSESSMENT & PLAN    Problem List Items Addressed This Visit       Cardiovascular and Mediastinum   New onset left bundle branch block (LBBB)    - EKG: HR 82, new LBBB (previous EKG from 2020 has no signs of LBBB)  - will go ahead and refer to cards  - pt not currently having any chest pain      Relevant Orders   Ambulatory referral to Cardiology     Endocrine   Uncontrolled type 2 diabetes mellitus with hyperglycemia (HCC)    - have gone ahead an increased mounjaro dosages      Relevant Medications   tirzepatide (MOUNJARO) 5 MG/0.5ML Pen   tirzepatide (MOUNJARO) 7.5 MG/0.5ML Pen   tirzepatide (MOUNJARO) 10 MG/0.5ML Pen     Nervous and Auditory   Presbycusis    - refer to audiology for decreased hearing. No cerumen  impaction seen on exam.       Relevant Orders   Ambulatory referral to Audiology     Musculoskeletal and Integument   Bone cyst of hand    Pt has cyst of first digit of right hand, will go ahead and refer to hand specialist for cyst removal.       Relevant Orders   Ambulatory referral to Hand Surgery     Other   Dyspnea - Primary   Relevant Orders   ECHOCARDIOGRAM COMPLETE   EKG 12-Lead   Other Visit Diagnoses     Foot callus       Relevant Orders   Ambulatory referral to Podiatry       Return in about 3 months (around 12/29/2022).      Meds ordered this encounter  Medications   tirzepatide (MOUNJARO) 5 MG/0.5ML Pen    Sig: Inject 5 mg into the skin once a week.    Dispense:  6 mL    Refill:  0   tirzepatide (MOUNJARO) 7.5 MG/0.5ML Pen    Sig: Inject 7.5 mg into the skin once a week.    Dispense:  6 mL    Refill:  0   tirzepatide (MOUNJARO) 10 MG/0.5ML Pen    Sig: Inject 10 mg into the skin once a week.    Dispense:  6 mL    Refill:  0    Orders Placed This Encounter  Procedures   Ambulatory referral to Cardiology    Referral Priority:   Routine    Referral Type:   Consultation    Referral Reason:   Specialty Services Required   Ambulatory referral to Audiology    Referral Priority:   Routine    Referral Type:   Audiology Exam    Referral Reason:   Specialty Services Required    Number of Visits Requested:   1   Ambulatory referral to Podiatry    Referral Priority:   Routine    Referral Type:   Consultation    Referral Reason:   Specialty Services Required    Requested Specialty:   Podiatry    Number of Visits Requested:   1   Ambulatory referral to Hand Surgery    Referral Priority:   Routine    Referral Type:   Surgical    Referral  Reason:   Specialty Services Required    Requested Specialty:   Hand Surgery    Number of Visits Requested:   1   EKG 12-Lead   ECHOCARDIOGRAM COMPLETE    Standing Status:   Future    Standing Expiration Date:    09/28/2023    Order Specific Question:   Where should this test be performed    Answer:   [facility]    Order Specific Question:   Perflutren DEFINITY (image enhancing agent) should be administered unless hypersensitivity or allergy exist    Answer:   Administer Perflutren    Order Specific Question:   Reason for exam-Echo    Answer:   Dyspnea  R06.00     Charlton Amor, DO  [facility] at [facility] [phone] (phone) [phone] (fax)  [facility]

## 2022-09-28 NOTE — Assessment & Plan Note (Signed)
-   have gone ahead an increased mounjaro dosages

## 2022-09-28 NOTE — Assessment & Plan Note (Signed)
-   refer to audiology for decreased hearing. No cerumen impaction seen on exam.

## 2022-09-28 NOTE — Assessment & Plan Note (Addendum)
-   EKG: HR 82, new LBBB (previous EKG from 2020 has no signs of LBBB)  - will go ahead and refer to cards  - pt not currently having any chest pain

## 2022-09-28 NOTE — Assessment & Plan Note (Signed)
Pt has cyst of first digit of right hand, will go ahead and refer to hand specialist for cyst removal.

## 2022-09-29 ENCOUNTER — Telehealth: Payer: Self-pay | Admitting: Pharmacist

## 2022-09-29 ENCOUNTER — Telehealth: Payer: Self-pay | Admitting: Family Medicine

## 2022-09-29 NOTE — Telephone Encounter (Signed)
Kay with [facility] called. Dr Tamera Punt ordered an Echo and PA is required.

## 2022-09-29 NOTE — Progress Notes (Signed)
Attempted to contact patient for medication management & care coordination, jardiance patient assistance application in progress. Left HIPAA compliant message for patient to return my call at their convenience.   Lynnda Shields, PharmD, BCPS Clinical Pharmacist [facility]

## 2022-10-05 NOTE — Telephone Encounter (Signed)
Spoke with Nicole Giles----she will send follow up message for insurance prior authorization afor the Echocardiogram ordered by Dr. Tamera Punt

## 2022-10-08 ENCOUNTER — Encounter: Payer: Self-pay | Admitting: Podiatry

## 2022-10-08 ENCOUNTER — Ambulatory Visit: Payer: Medicare PPO | Admitting: Podiatry

## 2022-10-08 DIAGNOSIS — M2041 Other hammer toe(s) (acquired), right foot: Secondary | ICD-10-CM | POA: Diagnosis not present

## 2022-10-08 DIAGNOSIS — R252 Cramp and spasm: Secondary | ICD-10-CM

## 2022-10-08 DIAGNOSIS — E1165 Type 2 diabetes mellitus with hyperglycemia: Secondary | ICD-10-CM

## 2022-10-08 DIAGNOSIS — M2042 Other hammer toe(s) (acquired), left foot: Secondary | ICD-10-CM | POA: Diagnosis not present

## 2022-10-08 NOTE — Telephone Encounter (Signed)
Left message for patient to call and discuss scheduling the Echocardiogram ordered by Dr. Tamera Punt

## 2022-10-08 NOTE — Progress Notes (Signed)
  Subjective:  Patient ID: Nicole Giles, female    DOB: 01-04-1946,   MRN: [id number]  Chief Complaint  Patient presents with   Foot Pain    Bilateral foot and swelling     77 y.o. female presents for diabetic foot check. 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 Also relates some toe deformities. Relates burning and tingling in their feet. Patient is diabetic and last A1c was  Lab Results  Component Value Date   HGBA1C 8.0 (H) 08/31/2022   .   PCP:  Charlton Amor, DO    . Denies any other pedal complaints. Denies n/v/f/c.   Past Medical History:  Diagnosis Date   Diabetes mellitus without complication (HCC)    GERD (gastroesophageal reflux disease)    H/O blood clots    in left leg after left arm surgery in 05/2006 ----- off coumadin   Morbid obesity (HCC) 11/17/2006   Qualifier: Diagnosis of  By: Linford Arnold MD, Catherine     OBSTRUCTIVE SLEEP APNEA 09/21/2007   Qualifier: Diagnosis of  By: Linford Arnold MD, Catherine     Polymyalgia (HCC) 08/11/2016    Objective:  Physical Exam: Vascular: DP/PT pulses 2/4 bilateral. CFT <3 seconds. Normal hair growth on digits. No edema.  Skin. No lacerations or abrasions bilateral feet.  Musculoskeletal: MMT 5/5 bilateral lower extremities in DF, PF, Inversion and Eversion. Deceased ROM in DF of ankle joint. Hammered digits 2-5 bilateral.  Neurological: Sensation intact to light touch. Protective sensation intact  Assessment:   1. Uncontrolled type 2 diabetes mellitus with hyperglycemia (HCC)   2. Hammertoe, bilateral   3. Foot cramps      Plan:  Patient was evaluated and treated and all questions answered. -Discussed and educated patient on diabetic foot care, especially with  regards to the vascular, neurological and musculoskeletal systems.  -Stressed the importance of good glycemic control and the detriment of not  controlling glucose levels in relation to the foot. -Discussed supportive shoes at all times and  checking feet regularly.  -Mechanically debrided all nails 1-5 bilateral using sterile nail nipper and filed with dremel without incident  -Discussed hammertoe deformities briefly and prevention of worsening in future.  -Answered all patient questions -Patient to return  in 1 year for DM foot check.  -Patient advised to call the office if any problems or questions arise in the meantime.   Louann Sjogren, DPM

## 2022-10-12 DIAGNOSIS — N281 Cyst of kidney, acquired: Secondary | ICD-10-CM | POA: Diagnosis not present

## 2022-10-12 DIAGNOSIS — Z9049 Acquired absence of other specified parts of digestive tract: Secondary | ICD-10-CM | POA: Diagnosis not present

## 2022-10-12 DIAGNOSIS — R7989 Other specified abnormal findings of blood chemistry: Secondary | ICD-10-CM | POA: Diagnosis not present

## 2022-10-18 DIAGNOSIS — K7400 Hepatic fibrosis, unspecified: Secondary | ICD-10-CM | POA: Diagnosis not present

## 2022-10-18 DIAGNOSIS — K7469 Other cirrhosis of liver: Secondary | ICD-10-CM | POA: Diagnosis not present

## 2022-10-18 DIAGNOSIS — K219 Gastro-esophageal reflux disease without esophagitis: Secondary | ICD-10-CM | POA: Diagnosis not present

## 2022-10-18 DIAGNOSIS — G4733 Obstructive sleep apnea (adult) (pediatric): Secondary | ICD-10-CM | POA: Diagnosis not present

## 2022-10-18 DIAGNOSIS — R1011 Right upper quadrant pain: Secondary | ICD-10-CM | POA: Diagnosis not present

## 2022-10-18 DIAGNOSIS — R7989 Other specified abnormal findings of blood chemistry: Secondary | ICD-10-CM | POA: Diagnosis not present

## 2022-10-29 ENCOUNTER — Other Ambulatory Visit: Payer: Self-pay | Admitting: Physician Assistant

## 2022-10-29 DIAGNOSIS — E039 Hypothyroidism, unspecified: Secondary | ICD-10-CM

## 2022-11-01 ENCOUNTER — Ambulatory Visit: Payer: Medicare PPO | Admitting: Family Medicine

## 2022-11-02 ENCOUNTER — Ambulatory Visit: Payer: Medicare PPO | Admitting: Audiology

## 2022-11-03 ENCOUNTER — Ambulatory Visit (INDEPENDENT_AMBULATORY_CARE_PROVIDER_SITE_OTHER): Payer: Medicare PPO

## 2022-11-03 DIAGNOSIS — R0602 Shortness of breath: Secondary | ICD-10-CM | POA: Diagnosis not present

## 2022-11-03 LAB — ECHOCARDIOGRAM COMPLETE
Area-P 1/2: 3.42 cm2
S' Lateral: 2.82 cm

## 2022-11-08 DIAGNOSIS — I2699 Other pulmonary embolism without acute cor pulmonale: Secondary | ICD-10-CM | POA: Diagnosis not present

## 2022-11-08 DIAGNOSIS — E1165 Type 2 diabetes mellitus with hyperglycemia: Secondary | ICD-10-CM | POA: Diagnosis not present

## 2022-11-09 DIAGNOSIS — Z961 Presence of intraocular lens: Secondary | ICD-10-CM | POA: Diagnosis not present

## 2022-11-09 DIAGNOSIS — H524 Presbyopia: Secondary | ICD-10-CM | POA: Diagnosis not present

## 2022-11-09 DIAGNOSIS — E119 Type 2 diabetes mellitus without complications: Secondary | ICD-10-CM | POA: Diagnosis not present

## 2022-11-09 LAB — HM DIABETES EYE EXAM

## 2022-11-15 ENCOUNTER — Ambulatory Visit: Payer: Medicare PPO | Attending: Family Medicine | Admitting: Audiology

## 2022-11-15 DIAGNOSIS — H903 Sensorineural hearing loss, bilateral: Secondary | ICD-10-CM | POA: Diagnosis not present

## 2022-11-15 NOTE — Procedures (Addendum)
  Outpatient Audiology and [facility] [address] 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 Nicole Giles is receiving a hearing evaluation due to concerns for decreased hearing . Nicole Giles has difficulty hearing in both ears. She was previously tested three years ago and diagnosed with hearing loss.  This difficulty began gradually. Pain or pressure reported in both ears. Tinnitus reported for both ears. Nicole Giles does not have a history of noise exposure. Dizziness reported. Nicole Giles reports difficulty hearing in the back seat of a car. 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 No other relevant case history reported.   Evaluation:  Otoscopy showed a clear view of the tympanic membrane for the right ear and a cloudy appearance to the left tympanic membrane.  Tympanometry results were consistent with normal middle ear function and tympanic membrane movement bilaterally (Type A).  Audiometric testing was completed using conventional audiometry with supraurals and insert transducer. Speech Recognition Thresholds were 35 dB in the right ear and 40 dB in the left ear. Word Recognition was  performed 75 dB SL, scored 84 % in the right ear and 80 dB SL, Scored 92% in the left ear. Pure tone thresholds show mild to moderately severe sensorineural  hearing loss in the right ear and mild to moderately severe sensorineural hearing loss in the left ear. An asymmetry was noted in the low frequencies for the left ear.    Results:  The test results were reviewed with Nicole Giles. Otoscopy indicated normal outer and middle ear anatomy  with clear tympanic membrane for the right ear and a cloudy appearing tympanic membrane for the left ear. Tympanometry shows normal middle ear function bilaterally. Audiometry suggests bilateral mild to moderately-severe sensorineural  hearing loss. Hearing aids are recommended and Nicole Giles was provided with a list of local providers that dispense hearing aids. Due to the asymmetry noted in the left ear and cloudy tympanic membrane, it is recommended that Nicole Giles be referred to an Ear Nose and Throat physician for further examination.   Recommendations:  Amplification is necessary for both ears. Hearing aids can be purchased from a variety of locations. See provided list for locations in the [place] area.  Referral to ENT Physician necessary due to cloudy left tympanic membrane and asymmetry in left ear.    30 minutes spent testing and counseling on results.   Nicole Giles Audiologist, Au.D., CCC-A 11/15/2022  2:18 PM  Nicole Giles Nicole Partridge, MS Audiology Student    Cc: Charlton Amor, DO

## 2022-11-17 ENCOUNTER — Other Ambulatory Visit: Payer: Self-pay

## 2022-11-23 ENCOUNTER — Ambulatory Visit (INDEPENDENT_AMBULATORY_CARE_PROVIDER_SITE_OTHER): Payer: Medicare PPO

## 2022-11-23 ENCOUNTER — Encounter: Payer: Self-pay | Admitting: Family Medicine

## 2022-11-23 ENCOUNTER — Ambulatory Visit: Payer: Medicare PPO | Admitting: Family Medicine

## 2022-11-23 VITALS — BP 101/82 | HR 94 | Resp 20 | Ht 59.0 in | Wt 204.5 lb

## 2022-11-23 DIAGNOSIS — K7460 Unspecified cirrhosis of liver: Secondary | ICD-10-CM | POA: Diagnosis not present

## 2022-11-23 DIAGNOSIS — Z0184 Encounter for antibody response examination: Secondary | ICD-10-CM | POA: Diagnosis not present

## 2022-11-23 DIAGNOSIS — R61 Generalized hyperhidrosis: Secondary | ICD-10-CM | POA: Insufficient documentation

## 2022-11-23 DIAGNOSIS — R059 Cough, unspecified: Secondary | ICD-10-CM

## 2022-11-23 DIAGNOSIS — H9113 Presbycusis, bilateral: Secondary | ICD-10-CM

## 2022-11-23 DIAGNOSIS — Z01818 Encounter for other preprocedural examination: Secondary | ICD-10-CM | POA: Diagnosis not present

## 2022-11-23 NOTE — Assessment & Plan Note (Signed)
Patient was diagnosed with cirrhosis of the liver by gastroenterology.  They recommended she receive the hep A and hep B vaccine.  We had a long discussion and she would like to go ahead with doing immunity testing first via blood work.  Pending on those results we will see if she needs to get hep A and hep B vaccines which will need to be done at the pharmacy due to her Medicare insurance status.

## 2022-11-23 NOTE — Addendum Note (Signed)
Addended by: Charlton Amor on: 11/23/2022 03:29 PM   Modules accepted: Orders

## 2022-11-23 NOTE — Assessment & Plan Note (Signed)
Pt was seen and evaluated and had severe hearing loss however she would like a new referral to ENT here in [place]. Referral has been sent. Pt is also stating she had some plates placed and is worried they are out of place

## 2022-11-23 NOTE — Progress Notes (Signed)
Established patient visit   Patient: Nicole Giles   DOB: 1946-04-29   77 y.o. Female  MRN: [id number] Visit Date: 11/23/2022  Today's healthcare provider: Charlton Amor, DO   Chief Complaint  Patient presents with   Follow-up    SUBJECTIVE    Chief Complaint  Patient presents with   Follow-up   HPI  Patient presents today for follow-up.  She get sweats from neck down. She says this has been going on for about 3 weeks. She says she feels like she is freezing. She says that her ac is set at 75. She sleeps on the couch in a blanket and robe. She works in a hotel at an office. She hasn't been around anyone that has been sick to her knowledge. Denies body aches and chills. Denies diarrhea.   She says she has a mineral taste in her mouth and doesn't eat much.  Review of Systems  Constitutional:  Negative for activity change, fatigue and fever.       Night sweats  Respiratory:  Negative for cough and shortness of breath.   Cardiovascular:  Negative for chest pain.  Gastrointestinal:  Negative for abdominal pain.  Genitourinary:  Negative for difficulty urinating.       Current Meds  Medication Sig   cyclobenzaprine (FLEXERIL) 5 MG tablet Take 1 tablet (5 mg total) by mouth at bedtime. For muscle spam as needed   diclofenac Sodium (VOLTAREN) 1 % GEL Apply 4 g topically 4 (four) times daily. To affected joint.   glipiZIDE (GLUCOTROL) 10 MG tablet Take 1 tablet (10 mg total) by mouth 2 (two) times daily before a meal.   glucose blood (TRUE METRIX BLOOD GLUCOSE TEST) test strip Dx DM E11.65. Check fasting blood sugar every morning.   JARDIANCE 25 MG TABS tablet TAKE 1 TABLET BY MOUTH ONCE DAILY BEFORE BREAKFAST FOR DIABETES   Lancets 33G MISC Dx DM E11.65. Check fasting blood sugar every morning.   levothyroxine (SYNTHROID) 25 MCG tablet Take 1 tablet by mouth once daily   Misc. Devices MISC Start AutoCPAP at 5-15 cm. water pressure.  Prefer Resmed S11 AutoCPAP machine with  a mask and supplies for moderate OSA with AHI 18.4.  Please use mask of patient preference.  Send to a local DME.   Multiple Vitamin (MULTIVITAMIN) capsule Take 1 capsule by mouth daily.   pantoprazole (PROTONIX) 40 MG tablet Take 1 tablet (40 mg total) by mouth 2 (two) times daily before a meal. For reflux.   rosuvastatin (CRESTOR) 10 MG tablet Take by mouth.   tirzepatide (MOUNJARO) 10 MG/0.5ML Pen Inject 10 mg into the skin once a week.   tirzepatide [facility]) 2.5 MG/0.5ML Pen Inject 2.5 mg into the skin once a week.   tirzepatide [facility]) 5 MG/0.5ML Pen Inject 5 mg into the skin once a week.   tirzepatide (MOUNJARO) 7.5 MG/0.5ML Pen Inject 7.5 mg into the skin once a week.    OBJECTIVE    BP 101/82 (BP Location: Left Wrist, Patient Position: Sitting, Cuff Size: Normal)   Pulse 94   Resp 20   Ht 4\' 11"  (1.499 m)   Wt 204 lb 8 oz (92.8 kg)   SpO2 98%   BMI 41.30 kg/m   Physical Exam Vitals and nursing note reviewed.  Constitutional:      General: She is not in acute distress.    Appearance: Normal appearance.  HENT:     Head: Normocephalic and atraumatic.  Comments: No cervical, posterior cervical, submandibular or supraclavicular lymph nodes felt     Right Ear: External ear normal.     Left Ear: External ear normal.     Nose: Nose normal.  Eyes:     Conjunctiva/sclera: Conjunctivae normal.  Cardiovascular:     Rate and Rhythm: Normal rate and regular rhythm.  Pulmonary:     Effort: Pulmonary effort is normal.     Breath sounds: Normal breath sounds.  Abdominal:     General: Abdomen is flat. Bowel sounds are normal.     Palpations: Abdomen is soft.     Tenderness: There is no abdominal tenderness.  Neurological:     General: No focal deficit present.     Mental Status: She is alert and oriented to person, place, and time.  Psychiatric:        Mood and Affect: Mood normal.        Behavior: Behavior normal.        Thought Content: Thought content normal.         Judgment: Judgment normal.       ASSESSMENT & PLAN    Problem List Items Addressed This Visit       Digestive   Cirrhosis of liver without ascites (HCC)    Patient was diagnosed with cirrhosis of the liver by gastroenterology.  They recommended she receive the hep A and hep B vaccine.  We had a long discussion and she would like to go ahead with doing immunity testing first via blood work.  Pending on those results we will see if she needs to get hep A and hep B vaccines which will need to be done at the pharmacy due to her Medicare insurance status.        Nervous and Auditory   Presbycusis    Pt was seen and evaluated and had severe hearing loss however she would like a new referral to ENT here in [place]. Referral has been sent. Pt is also stating she had some plates placed and is worried they are out of place      Relevant Orders   Ambulatory referral to ENT     Other   Night sweats - Primary    Patient says that she has been having night sweats for the past 3 weeks.  She says she will lay on the couch with her robe and blanket and wake up sweating.  She says that she will go to bed cold and then wake up sweating.  She is postmenopausal and has been for about 10 years.  She has only lost about 10 pounds since February however she is on [facility] for her diabetes so I anticipate some weight loss.  This is not a significant amount of weight loss.  Also ordering a CBC as well as peripheral smear and chest x-ray to further evaluate for any other etiology.  Patient has no risk factors for HIV or TB so I am not inclined to test at this moment.  Her thyroid levels were just checked 3 months ago.  No enlarged lymph nodes were felt on exam today      Relevant Orders   CBC   Pathologist smear review   DG Chest 2 View   Other Visit Diagnoses     Immunity to hepatitis A virus determined by serologic test       Relevant Orders   Hepatitis A antibody, total   Immunity to hepatitis B  virus demonstrated by serologic  test       Relevant Orders   HBsAb Quant HBIG Assessment       No follow-ups on file.      No orders of the defined types were placed in this encounter.   Orders Placed This Encounter  Procedures   DG Chest 2 View    Standing Status:   Future    Standing Expiration Date:   11/23/2023    Order Specific Question:   Reason for exam:    Answer:   Cough, assess intra-thoracic pathology    Order Specific Question:   Preferred imaging location?    Answer:   [facility]   HBsAb Quant HBIG Assessment   Hepatitis A antibody, total   CBC   Pathologist smear review   Ambulatory referral to ENT    Referral Priority:   Urgent    Referral Type:   Consultation    Referral Reason:   Specialty Services Required    Requested Specialty:   Otolaryngology    Number of Visits Requested:   1     Charlton Amor, DO  [facility] Medicine at [facility] [phone] (phone) [phone] (fax)  [facility]

## 2022-11-23 NOTE — Assessment & Plan Note (Addendum)
Patient says that she has been having night sweats for the past 3 weeks.  She says she will lay on the couch with her robe and blanket and wake up sweating.  She says that she will go to bed cold and then wake up sweating.  She is postmenopausal and has been for about 10 years.  She has only lost about 10 pounds since February however she is on [facility] for her diabetes so I anticipate some weight loss.  This is not a significant amount of weight loss.  Also ordering a CBC as well as peripheral smear and chest x-ray to further evaluate for any other etiology.  Patient has no risk factors for HIV or TB so I am not inclined to test at this moment.  Her thyroid levels were just checked 3 months ago.  No enlarged lymph nodes were felt on exam today

## 2022-11-25 DIAGNOSIS — R1310 Dysphagia, unspecified: Secondary | ICD-10-CM | POA: Diagnosis not present

## 2022-11-25 DIAGNOSIS — H6992 Unspecified Eustachian tube disorder, left ear: Secondary | ICD-10-CM | POA: Diagnosis not present

## 2022-11-25 DIAGNOSIS — H903 Sensorineural hearing loss, bilateral: Secondary | ICD-10-CM | POA: Diagnosis not present

## 2022-11-25 LAB — CBC
HCT: 45.6 % — ABNORMAL HIGH (ref 35.0–45.0)
Hemoglobin: 14.8 g/dL (ref 11.7–15.5)
MCH: 32.2 pg (ref 27.0–33.0)
MCHC: 32.5 g/dL (ref 32.0–36.0)
MCV: 99.1 fL (ref 80.0–100.0)
MPV: 11.8 fL (ref 7.5–12.5)
Platelets: 317 10*3/uL (ref 140–400)
RBC: 4.6 10*6/uL (ref 3.80–5.10)
RDW: 12.4 % (ref 11.0–15.0)
WBC: 9.7 10*3/uL (ref 3.8–10.8)

## 2022-11-25 LAB — PATHOLOGIST SMEAR REVIEW

## 2022-11-25 LAB — HEPATITIS A ANTIBODY, TOTAL: Hepatitis A AB,Total: REACTIVE — AB

## 2022-11-25 LAB — HEPATITIS B SURFACE ANTIBODY,QUALITATIVE: Hep B S Ab: NONREACTIVE

## 2022-11-29 DIAGNOSIS — M67441 Ganglion, right hand: Secondary | ICD-10-CM | POA: Diagnosis not present

## 2022-11-29 DIAGNOSIS — M19041 Primary osteoarthritis, right hand: Secondary | ICD-10-CM | POA: Diagnosis not present

## 2022-12-14 NOTE — Progress Notes (Deleted)
Warner Mccreedy, DO Reason for referral-abnormal electrocardiogram  HPI: 77 year old female for evaluation of abnormal electrocardiogram at request of Morey Hummingbird, DO.  Patient had an ECG May 2024 that showed left bundle branch block which was new.  Echocardiogram June 2024 showed normal LV function, mild left ventricular hypertrophy, grade 1 diastolic dysfunction.  Cardiology asked to evaluate.  Note patient does have a history of hemochromatosis.  Current Outpatient Medications  Medication Sig Dispense Refill   cyclobenzaprine (FLEXERIL) 5 MG tablet Take 1 tablet (5 mg total) by mouth at bedtime. For muscle spam as needed 10 tablet 0   diclofenac Sodium (VOLTAREN) 1 % GEL Apply 4 g topically 4 (four) times daily. To affected joint. 100 g 1   glipiZIDE (GLUCOTROL) 10 MG tablet Take 1 tablet (10 mg total) by mouth 2 (two) times daily before a meal. 60 tablet 2   glucose blood (TRUE METRIX BLOOD GLUCOSE TEST) test strip Dx DM E11.65. Check fasting blood sugar every morning. 100 each PRN   JARDIANCE 25 MG TABS tablet TAKE 1 TABLET BY MOUTH ONCE DAILY BEFORE BREAKFAST FOR DIABETES 90 tablet 0   Lancets 33G MISC Dx DM E11.65. Check fasting blood sugar every morning. 100 each PRN   levothyroxine (SYNTHROID) 25 MCG tablet Take 1 tablet by mouth once daily 90 tablet 0   Misc. Devices MISC Start AutoCPAP at 5-15 cm. water pressure.  Prefer Resmed S11 AutoCPAP machine with a mask and supplies for moderate OSA with AHI 18.4.  Please use mask of patient preference.  Send to a local DME.     Multiple Vitamin (MULTIVITAMIN) capsule Take 1 capsule by mouth daily.     pantoprazole (PROTONIX) 40 MG tablet Take 1 tablet (40 mg total) by mouth 2 (two) times daily before a meal. For reflux. 180 tablet 1   rosuvastatin (CRESTOR) 10 MG tablet Take by mouth.     tirzepatide (MOUNJARO) 10 MG/0.5ML Pen Inject 10 mg into the skin once a week. 6 mL 0   tirzepatide (MOUNJARO) 2.5 MG/0.5ML Pen Inject 2.5 mg  into the skin once a week. 2 mL 0   tirzepatide (MOUNJARO) 5 MG/0.5ML Pen Inject 5 mg into the skin once a week. 6 mL 0   tirzepatide (MOUNJARO) 7.5 MG/0.5ML Pen Inject 7.5 mg into the skin once a week. 6 mL 0   No current facility-administered medications for this visit.    Allergies  Allergen Reactions   Atorvastatin Other (See Comments)   Codeine    Lipitor [Atorvastatin Calcium]     myaglia   Meperidine Hcl      Past Medical History:  Diagnosis Date   Diabetes mellitus without complication (HCC)    GERD (gastroesophageal reflux disease)    H/O blood clots    in left leg after left arm surgery in 05/2006 ----- off coumadin   Morbid obesity (HCC) 11/17/2006   Qualifier: Diagnosis of  By: Linford Arnold MD, Catherine     OBSTRUCTIVE SLEEP APNEA 09/21/2007   Qualifier: Diagnosis of  By: Linford Arnold MD, Catherine     Polymyalgia (HCC) 08/11/2016    Past Surgical History:  Procedure Laterality Date   CHOLECYSTECTOMY     CYSTECTOMY     left arm fracture     NECK SURGERY     cyst removal   right knee surgery      Social History   Socioeconomic History   Marital status: Single    Spouse name: Not on file   Number  of children: 3   Years of education: 14   Highest education level: Associate degree: occupational, Scientist, product/process development, or vocational program  Occupational History   Occupation: Art therapist    Comment: [employer]  Tobacco Use   Smoking status: Former    Current packs/day: 0.00    Types: Cigarettes    Quit date: 03/25/1972    Years since quitting: 50.7   Smokeless tobacco: Never  Vaping Use   Vaping status: Never Used  Substance and Sexual Activity   Alcohol use: No   Drug use: No   Sexual activity: Never  Other Topics Concern   Not on file  Social History Narrative   Lives alone. Recently had neck surgery, she is not able to lift over 5 lbs; she can't look at the computer for a long time without her neck aching or headaches. She is not able to do much exercise due  to that. She has two living children, and one passed away in 03/17/19.   Social Determinants of Health   Financial Resource Strain: Low Risk  (11/08/2022)   Received from [facility], [facility]   Overall Financial Resource Strain (CARDIA)    Difficulty of Paying Living Expenses: Not hard at all  Food Insecurity: No Food Insecurity (11/08/2022)   Received from [facility], [facility]   Hunger Vital Sign    Worried About Running Out of Food in the Last Year: Never true    Ran Out of Food in the Last Year: Never true  Transportation Needs: No Transportation Needs (11/08/2022)   [facility] from [facility]   PRAPARE - Transportation    Lack of Transportation (Medical): No    Lack of Transportation (Non-Medical): No  Physical Activity: Insufficiently Active (03/12/2022)   Exercise Vital Sign    Days of Exercise per Week: 7 days    Minutes of Exercise per Session: 10 min  Stress: No Stress Concern Present (03/12/2022)   [employer] of Occupational Health - Occupational Stress Questionnaire    Feeling of Stress : Not at all  Social Connections: Socially Isolated (03/12/2022)   Social Connection and Isolation Panel [NHANES]    Frequency of Communication with Friends and Family: More than three times a week    Frequency of Social Gatherings with Friends and Family: Once a week    Attends Religious Services: Never    Database administrator or Organizations: No    Attends Banker Meetings: Never    Marital Status: Divorced  Catering manager Violence: Not At Risk (03/12/2022)   Humiliation, Afraid, Rape, and Kick questionnaire    Fear of Current or Ex-Partner: No    Emotionally Abused: No    Physically Abused: No    Sexually Abused: No    Family History  Problem Relation Age of Onset   Prostate cancer Other    Diabetes Other    Diabetes Brother     ROS: no fevers or chills, productive cough, hemoptysis, dysphasia, odynophagia, melena,  hematochezia, dysuria, hematuria, rash, seizure activity, orthopnea, PND, pedal edema, claudication. Remaining systems are negative.  Physical Exam:   There were no vitals taken for this visit.  General:  Well developed/well nourished in NAD Skin warm/dry Patient not depressed No peripheral clubbing Back-normal HEENT-normal/normal eyelids Neck supple/normal carotid upstroke bilaterally; no bruits; no JVD; no thyromegaly chest - CTA/ normal expansion CV - RRR/normal S1 and S2; no murmurs, rubs or gallops;  PMI nondisplaced Abdomen -NT/ND, no HSM, no mass, +  bowel sounds, no bruit 2+ femoral pulses, no bruits Ext-no edema, chords, 2+ DP Neuro-grossly nonfocal  ECG -Sep 28 2022-normal sinus rhythm with left bundle branch block.  Personally reviewed  A/P  1 abnormal electrocardiogram-recent ECG showed left bundle branch block which is new.  Echocardiogram showed normal LV function.  2 obstructive sleep apnea-  3 dyspnea-likely multifactorial including history of obstructive sleep apnea and obesity hypoventilation syndrome.  May also be a component of deconditioning.  However also long history of diabetes mellitus.  Will arrange Lexiscan nuclear study to screen for ischemia.  Olga Millers, MD

## 2022-12-17 DIAGNOSIS — K7400 Hepatic fibrosis, unspecified: Secondary | ICD-10-CM | POA: Diagnosis not present

## 2022-12-17 DIAGNOSIS — R7989 Other specified abnormal findings of blood chemistry: Secondary | ICD-10-CM | POA: Diagnosis not present

## 2022-12-17 DIAGNOSIS — Z23 Encounter for immunization: Secondary | ICD-10-CM | POA: Diagnosis not present

## 2022-12-17 DIAGNOSIS — K7469 Other cirrhosis of liver: Secondary | ICD-10-CM | POA: Diagnosis not present

## 2022-12-20 ENCOUNTER — Ambulatory Visit: Payer: Medicare PPO | Admitting: Cardiology

## 2022-12-23 DIAGNOSIS — R1319 Other dysphagia: Secondary | ICD-10-CM | POA: Diagnosis not present

## 2022-12-23 DIAGNOSIS — E119 Type 2 diabetes mellitus without complications: Secondary | ICD-10-CM | POA: Diagnosis not present

## 2022-12-23 DIAGNOSIS — Z4659 Encounter for fitting and adjustment of other gastrointestinal appliance and device: Secondary | ICD-10-CM | POA: Diagnosis not present

## 2022-12-23 DIAGNOSIS — K2289 Other specified disease of esophagus: Secondary | ICD-10-CM | POA: Diagnosis not present

## 2022-12-23 DIAGNOSIS — C159 Malignant neoplasm of esophagus, unspecified: Secondary | ICD-10-CM | POA: Diagnosis not present

## 2022-12-23 DIAGNOSIS — C772 Secondary and unspecified malignant neoplasm of intra-abdominal lymph nodes: Secondary | ICD-10-CM | POA: Diagnosis not present

## 2022-12-23 DIAGNOSIS — I4891 Unspecified atrial fibrillation: Secondary | ICD-10-CM | POA: Diagnosis not present

## 2022-12-23 DIAGNOSIS — R69 Illness, unspecified: Secondary | ICD-10-CM | POA: Diagnosis not present

## 2022-12-23 DIAGNOSIS — K449 Diaphragmatic hernia without obstruction or gangrene: Secondary | ICD-10-CM | POA: Diagnosis not present

## 2022-12-23 DIAGNOSIS — R1013 Epigastric pain: Secondary | ICD-10-CM | POA: Diagnosis not present

## 2022-12-23 DIAGNOSIS — C771 Secondary and unspecified malignant neoplasm of intrathoracic lymph nodes: Secondary | ICD-10-CM | POA: Diagnosis not present

## 2022-12-23 DIAGNOSIS — C158 Malignant neoplasm of overlapping sites of esophagus: Secondary | ICD-10-CM | POA: Diagnosis not present

## 2022-12-23 DIAGNOSIS — I2699 Other pulmonary embolism without acute cor pulmonale: Secondary | ICD-10-CM | POA: Diagnosis not present

## 2022-12-23 DIAGNOSIS — E8889 Other specified metabolic disorders: Secondary | ICD-10-CM | POA: Diagnosis not present

## 2022-12-23 DIAGNOSIS — I447 Left bundle-branch block, unspecified: Secondary | ICD-10-CM | POA: Diagnosis not present

## 2022-12-23 DIAGNOSIS — C7801 Secondary malignant neoplasm of right lung: Secondary | ICD-10-CM | POA: Diagnosis not present

## 2022-12-23 DIAGNOSIS — R6339 Other feeding difficulties: Secondary | ICD-10-CM | POA: Diagnosis not present

## 2022-12-23 DIAGNOSIS — Z743 Need for continuous supervision: Secondary | ICD-10-CM | POA: Diagnosis not present

## 2022-12-23 DIAGNOSIS — R0602 Shortness of breath: Secondary | ICD-10-CM | POA: Diagnosis not present

## 2022-12-23 DIAGNOSIS — E876 Hypokalemia: Secondary | ICD-10-CM | POA: Diagnosis not present

## 2022-12-23 DIAGNOSIS — I454 Nonspecific intraventricular block: Secondary | ICD-10-CM | POA: Diagnosis not present

## 2022-12-23 DIAGNOSIS — R9431 Abnormal electrocardiogram [ECG] [EKG]: Secondary | ICD-10-CM | POA: Diagnosis not present

## 2022-12-23 DIAGNOSIS — E8729 Other acidosis: Secondary | ICD-10-CM | POA: Diagnosis not present

## 2022-12-23 DIAGNOSIS — C7802 Secondary malignant neoplasm of left lung: Secondary | ICD-10-CM | POA: Diagnosis not present

## 2022-12-23 DIAGNOSIS — R918 Other nonspecific abnormal finding of lung field: Secondary | ICD-10-CM | POA: Diagnosis not present

## 2022-12-23 DIAGNOSIS — R079 Chest pain, unspecified: Secondary | ICD-10-CM | POA: Diagnosis not present

## 2023-01-07 ENCOUNTER — Telehealth: Payer: Self-pay | Admitting: Family Medicine

## 2023-01-07 NOTE — Telephone Encounter (Signed)
Donella Stade from [facility] called to inform that the patient was admitted to Hospice at home on 8/27.   Donella Stade [phone]

## 2023-01-07 NOTE — Telephone Encounter (Signed)
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 She states that she got out of the hospital on 8/23. Found out that she had cancer on 8/15. Also states that is the reason she could not eat, had a mass in her esophagus.

## 2023-01-11 ENCOUNTER — Encounter: Payer: Self-pay | Admitting: Family Medicine

## 2023-01-27 ENCOUNTER — Telehealth: Payer: Self-pay | Admitting: Family Medicine

## 2023-02-08 NOTE — Telephone Encounter (Signed)
[facility] called to say Patient passed away on February 11, 2023 27-Sep-2024at home

## 2023-02-08 DEATH — deceased

## 2023-10-07 ENCOUNTER — Ambulatory Visit: Payer: Medicare PPO | Admitting: Podiatry
